# Patient Record
Sex: Female | Born: 1980 | Race: White | Hispanic: No | Smoking: Current every day smoker
Health system: Southern US, Community
[De-identification: ages and names within clinical notes are randomized; demographics above are authoritative.]

## PROBLEM LIST (undated history)

## (undated) VITALS — BP 109/81 | HR 106 | Temp 97.4°F | Resp 16 | Ht 60.63 in | Wt 100.4 lb

## (undated) DIAGNOSIS — B009 Herpesviral infection, unspecified: Secondary | ICD-10-CM

## (undated) DIAGNOSIS — F329 Major depressive disorder, single episode, unspecified: Secondary | ICD-10-CM

## (undated) DIAGNOSIS — R569 Unspecified convulsions: Secondary | ICD-10-CM

## (undated) DIAGNOSIS — F32A Depression, unspecified: Secondary | ICD-10-CM

## (undated) DIAGNOSIS — F319 Bipolar disorder, unspecified: Secondary | ICD-10-CM

---
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
## 2002-01-11 ENCOUNTER — Ambulatory Visit (HOSPITAL_COMMUNITY): Admission: RE | Admit: 2002-01-11 | Discharge: 2002-01-11 | Payer: Self-pay | Admitting: Obstetrics & Gynecology

## 2002-01-11 ENCOUNTER — Encounter: Payer: Self-pay | Admitting: Obstetrics & Gynecology

## 2002-02-08 ENCOUNTER — Ambulatory Visit (HOSPITAL_COMMUNITY): Admission: RE | Admit: 2002-02-08 | Discharge: 2002-02-08 | Payer: Self-pay | Admitting: Obstetrics & Gynecology

## 2002-02-08 ENCOUNTER — Encounter: Payer: Self-pay | Admitting: Obstetrics & Gynecology

## 2002-03-03 ENCOUNTER — Inpatient Hospital Stay (HOSPITAL_COMMUNITY): Admission: AD | Admit: 2002-03-03 | Discharge: 2002-03-08 | Payer: Self-pay | Admitting: Obstetrics

## 2002-03-03 ENCOUNTER — Encounter (INDEPENDENT_AMBULATORY_CARE_PROVIDER_SITE_OTHER): Payer: Self-pay | Admitting: Specialist

## 2003-01-26 ENCOUNTER — Emergency Department (HOSPITAL_COMMUNITY): Admission: EM | Admit: 2003-01-26 | Discharge: 2003-01-26 | Payer: Self-pay | Admitting: Emergency Medicine

## 2003-08-22 ENCOUNTER — Encounter (INDEPENDENT_AMBULATORY_CARE_PROVIDER_SITE_OTHER): Payer: Self-pay | Admitting: Specialist

## 2003-08-22 ENCOUNTER — Inpatient Hospital Stay (HOSPITAL_COMMUNITY): Admission: AD | Admit: 2003-08-22 | Discharge: 2003-08-24 | Payer: Self-pay | Admitting: Obstetrics

## 2005-05-01 ENCOUNTER — Emergency Department (HOSPITAL_COMMUNITY): Admission: EM | Admit: 2005-05-01 | Discharge: 2005-05-01 | Payer: Self-pay | Admitting: Emergency Medicine

## 2005-10-29 ENCOUNTER — Emergency Department (HOSPITAL_COMMUNITY): Admission: EM | Admit: 2005-10-29 | Discharge: 2005-10-30 | Payer: Self-pay | Admitting: Emergency Medicine

## 2005-11-22 ENCOUNTER — Emergency Department (HOSPITAL_COMMUNITY): Admission: EM | Admit: 2005-11-22 | Discharge: 2005-11-22 | Payer: Self-pay | Admitting: *Deleted

## 2005-12-10 ENCOUNTER — Emergency Department (HOSPITAL_COMMUNITY): Admission: EM | Admit: 2005-12-10 | Discharge: 2005-12-10 | Payer: Self-pay | Admitting: Family Medicine

## 2005-12-24 ENCOUNTER — Emergency Department (HOSPITAL_COMMUNITY): Admission: EM | Admit: 2005-12-24 | Discharge: 2005-12-24 | Payer: Self-pay | Admitting: Family Medicine

## 2007-08-10 ENCOUNTER — Ambulatory Visit (HOSPITAL_COMMUNITY): Admission: RE | Admit: 2007-08-10 | Discharge: 2007-08-10 | Payer: Self-pay | Admitting: Obstetrics and Gynecology

## 2007-08-31 ENCOUNTER — Ambulatory Visit (HOSPITAL_COMMUNITY): Admission: RE | Admit: 2007-08-31 | Discharge: 2007-08-31 | Payer: Self-pay | Admitting: Obstetrics and Gynecology

## 2007-09-17 ENCOUNTER — Ambulatory Visit (HOSPITAL_COMMUNITY): Admission: RE | Admit: 2007-09-17 | Discharge: 2007-09-17 | Payer: Self-pay | Admitting: Obstetrics and Gynecology

## 2007-10-15 ENCOUNTER — Ambulatory Visit (HOSPITAL_COMMUNITY): Admission: RE | Admit: 2007-10-15 | Discharge: 2007-10-15 | Payer: Self-pay | Admitting: Obstetrics and Gynecology

## 2007-11-04 ENCOUNTER — Inpatient Hospital Stay (HOSPITAL_COMMUNITY): Admission: AD | Admit: 2007-11-04 | Discharge: 2007-11-04 | Payer: Self-pay | Admitting: Obstetrics and Gynecology

## 2007-11-05 ENCOUNTER — Ambulatory Visit (HOSPITAL_COMMUNITY): Admission: RE | Admit: 2007-11-05 | Discharge: 2007-11-05 | Payer: Self-pay | Admitting: Obstetrics and Gynecology

## 2007-11-19 ENCOUNTER — Ambulatory Visit (HOSPITAL_COMMUNITY): Admission: RE | Admit: 2007-11-19 | Discharge: 2007-11-19 | Payer: Self-pay | Admitting: Obstetrics and Gynecology

## 2007-12-15 ENCOUNTER — Inpatient Hospital Stay (HOSPITAL_COMMUNITY): Admission: AD | Admit: 2007-12-15 | Discharge: 2007-12-15 | Payer: Self-pay | Admitting: Obstetrics & Gynecology

## 2009-03-07 ENCOUNTER — Inpatient Hospital Stay (HOSPITAL_COMMUNITY): Admission: EM | Admit: 2009-03-07 | Discharge: 2009-03-10 | Payer: Self-pay | Admitting: Emergency Medicine

## 2009-03-10 ENCOUNTER — Encounter (INDEPENDENT_AMBULATORY_CARE_PROVIDER_SITE_OTHER): Payer: Self-pay | Admitting: Nurse Practitioner

## 2009-04-26 ENCOUNTER — Encounter (INDEPENDENT_AMBULATORY_CARE_PROVIDER_SITE_OTHER): Payer: Self-pay | Admitting: Nurse Practitioner

## 2009-07-06 ENCOUNTER — Emergency Department (HOSPITAL_COMMUNITY): Admission: EM | Admit: 2009-07-06 | Discharge: 2009-07-07 | Payer: Self-pay | Admitting: Emergency Medicine

## 2009-08-14 ENCOUNTER — Emergency Department (HOSPITAL_COMMUNITY): Admission: EM | Admit: 2009-08-14 | Discharge: 2009-08-15 | Payer: Self-pay | Admitting: Emergency Medicine

## 2010-03-05 ENCOUNTER — Encounter: Payer: Self-pay | Admitting: Obstetrics and Gynecology

## 2010-03-13 NOTE — Miscellaneous (Signed)
Summary: Pt no-showed to hospital f/u on 03/24/2009  Clinical Lists Changes

## 2010-03-13 NOTE — Letter (Signed)
Summary: Discharge Summary  Discharge Summary   Imported By: Arta Bruce 06/23/2009 16:00:03  _____________________________________________________________________  External Attachment:    Type:   Image     Comment:   External Document

## 2010-04-29 LAB — URINALYSIS, ROUTINE W REFLEX MICROSCOPIC
Glucose, UA: NEGATIVE mg/dL
Protein, ur: 100 mg/dL — AB
Urobilinogen, UA: 1 mg/dL (ref 0.0–1.0)

## 2010-04-29 LAB — RAPID URINE DRUG SCREEN, HOSP PERFORMED
Opiates: POSITIVE — AB
Tetrahydrocannabinol: NOT DETECTED

## 2010-04-29 LAB — CBC
MCH: 29.6 pg (ref 26.0–34.0)
MCHC: 35.1 g/dL (ref 30.0–36.0)
Platelets: 204 10*3/uL (ref 150–400)

## 2010-04-29 LAB — BASIC METABOLIC PANEL
CO2: 26 mEq/L (ref 19–32)
Calcium: 9.7 mg/dL (ref 8.4–10.5)
Sodium: 138 mEq/L (ref 135–145)

## 2010-04-29 LAB — DIFFERENTIAL
Basophils Relative: 0 % (ref 0–1)
Eosinophils Absolute: 0 10*3/uL (ref 0.0–0.7)
Lymphs Abs: 0.9 10*3/uL (ref 0.7–4.0)
Neutro Abs: 7.5 10*3/uL (ref 1.7–7.7)
Neutrophils Relative %: 87 % — ABNORMAL HIGH (ref 43–77)

## 2010-04-29 LAB — URINE MICROSCOPIC-ADD ON

## 2010-04-29 LAB — PREGNANCY, URINE: Preg Test, Ur: NEGATIVE

## 2010-04-30 LAB — BASIC METABOLIC PANEL
BUN: 9 mg/dL (ref 6–23)
BUN: 7 mg/dL (ref 6–23)
CO2: 26 mEq/L (ref 19–32)
Chloride: 106 mEq/L (ref 96–112)
GFR calc Af Amer: >60 mL/min (ref >60)
GFR calc Af Amer: >60 mL/min (ref >60)
GFR calc non Af Amer: >60 mL/min (ref >60)
GFR calc non Af Amer: >60 mL/min (ref >60)
Glucose, Bld: 93 mg/dL (ref 70–99)
Glucose, Bld: 79 mg/dL (ref 70–99)
Potassium: 3.5 mEq/L (ref 3.5–5.1)
Potassium: 3.5 mEq/L (ref 3.5–5.1)
Potassium: 3.7 mEq/L (ref 3.5–5.1)
Sodium: 136 mEq/L (ref 135–145)
Sodium: 139 mEq/L (ref 135–145)

## 2010-04-30 LAB — TSH: TSH: 2.078 u[IU]/mL (ref 0.350–4.500)

## 2010-04-30 LAB — TYPE AND SCREEN
ABO/RH(D): O POS
Antibody Screen: NEGATIVE

## 2010-04-30 LAB — DIFFERENTIAL
Basophils Absolute: 0 10*3/uL (ref 0.0–0.1)
Basophils Absolute: 0 10*3/uL (ref 0.0–0.1)
Basophils Relative: 1 % (ref 0–1)
Basophils Relative: 1 % (ref 0–1)
Basophils Relative: 1 % (ref 0–1)
Eosinophils Absolute: 0.1 10*3/uL (ref 0.0–0.7)
Eosinophils Relative: 0 % (ref 0–5)
Eosinophils Relative: 2 % (ref 0–5)
Lymphocytes Relative: 51 % — ABNORMAL HIGH (ref 12–46)
Lymphocytes Relative: 38 % (ref 12–46)
Lymphs Abs: 1.2 10*3/uL (ref 0.7–4.0)
Monocytes Absolute: 0.2 10*3/uL (ref 0.1–1.0)
Monocytes Absolute: 0.3 10*3/uL (ref 0.1–1.0)
Monocytes Relative: 5 % (ref 3–12)
Monocytes Relative: 7 % (ref 3–12)
Neutro Abs: 1 10*3/uL — ABNORMAL LOW (ref 1.7–7.7)
Neutro Abs: 1.1 10*3/uL — ABNORMAL LOW (ref 1.7–7.7)

## 2010-04-30 LAB — RETICULOCYTES: RBC.: 4.41 MIL/uL (ref 3.87–5.11)

## 2010-04-30 LAB — CBC
HCT: 25.5 % — ABNORMAL LOW (ref 36.0–46.0)
HCT: 35.5 % — ABNORMAL LOW (ref 36.0–46.0)
Hemoglobin: 7.5 g/dL — ABNORMAL LOW (ref 12.0–15.0)
Hemoglobin: 7.8 g/dL — ABNORMAL LOW (ref 12.0–15.0)
MCHC: 33.4 g/dL (ref 30.0–36.0)
MCV: 62.1 fL — ABNORMAL LOW (ref 78.0–100.0)
MCV: 84.7 fL (ref 78.0–100.0)
Platelets: 162 10*3/uL (ref 150–400)
Platelets: 177 10*3/uL (ref 150–400)
RBC: 3.97 MIL/uL (ref 3.87–5.11)
RDW: 13.2 % (ref 11.5–15.5)
WBC: 2.4 10*3/uL — ABNORMAL LOW (ref 4.0–10.5)
WBC: 2.5 10*3/uL — ABNORMAL LOW (ref 4.0–10.5)
WBC: 2.8 10*3/uL — ABNORMAL LOW (ref 4.0–10.5)

## 2010-04-30 LAB — PREGNANCY, URINE: Preg Test, Ur: NEGATIVE

## 2010-04-30 LAB — VITAMIN B12: Vitamin B-12: 269 pg/mL (ref 211–911)

## 2010-04-30 LAB — POCT PREGNANCY, URINE: Preg Test, Ur: NEGATIVE

## 2010-04-30 LAB — RAPID URINE DRUG SCREEN, HOSP PERFORMED
Amphetamines: NOT DETECTED
Benzodiazepines: NOT DETECTED
Cocaine: NOT DETECTED
Opiates: POSITIVE — AB
Tetrahydrocannabinol: NOT DETECTED
Tetrahydrocannabinol: NOT DETECTED

## 2010-04-30 LAB — URINE MICROSCOPIC-ADD ON

## 2010-04-30 LAB — URINALYSIS, ROUTINE W REFLEX MICROSCOPIC
Hgb urine dipstick: NEGATIVE
Protein, ur: 30 mg/dL — AB
Urobilinogen, UA: 0.2 mg/dL (ref 0.0–1.0)

## 2010-04-30 LAB — HEPATIC FUNCTION PANEL
Alkaline Phosphatase: 49 U/L (ref 39–117)
Total Bilirubin: 0.2 mg/dL — ABNORMAL LOW (ref 0.3–1.2)
Total Protein: 6 g/dL (ref 6.0–8.3)

## 2010-04-30 LAB — ETHANOL: Alcohol, Ethyl (B): <5 mg/dL (ref 0–10)

## 2010-04-30 LAB — T4, FREE: Free T4: 0.76 ng/dL — ABNORMAL LOW (ref 0.80–1.80)

## 2010-04-30 LAB — TRICYCLICS SCREEN, URINE: TCA Scrn: NOT DETECTED

## 2010-04-30 LAB — SALICYLATE LEVEL: Salicylate Lvl: <4 mg/dL (ref 2.8–20.0)

## 2010-04-30 LAB — MAGNESIUM: Magnesium: 1.8 mg/dL (ref 1.5–2.5)

## 2010-04-30 LAB — HEMOCCULT GUIAC POC 1CARD (OFFICE): Fecal Occult Bld: NEGATIVE

## 2010-04-30 LAB — AMMONIA: Ammonia: 29 umol/L (ref 11–35)

## 2010-04-30 LAB — IRON AND TIBC: UIBC: 316 ug/dL

## 2010-06-29 NOTE — Op Note (Signed)
NAME:  Julie Sanford, Julie Sanford                         ACCOUNT NO.:  [id number]   MEDICAL RECORD NO.:  [id number]                   PATIENT TYPE:  INP   LOCATION:  9121                                 FACILITY:  [facility]   PHYSICIAN:  Charles A. Clearance Coots, M.D.             DATE OF BIRTH:  21-Dec-1980   DATE OF PROCEDURE:  03/03/2002  DATE OF DISCHARGE:                                 OPERATIVE REPORT   PREOPERATIVE DIAGNOSES:  1. A 30-week gestation.  2. Spontaneous rupture of membranes.  3. Active labor.  4. Fetal gastroschisis.  5. Questionable human immunodeficiency virus status.   POSTOPERATIVE DIAGNOSES:  1. A 30-week gestation.  2. Spontaneous rupture of membranes.  3. Active labor.  4. Fetal gastroschisis.  5. Questionable human immunodeficiency virus status.   PROCEDURE:  Primary low transverse cesarean section.   SURGEON:  Charles A. Clearance Coots, M.D.   ANESTHESIA:  Spinal.   ESTIMATED BLOOD LOSS:  800 ml.   COMPLICATIONS:  None.   DRAINS:  Foley to gravity.   FINDINGS:  Viable female at 15.  Apgars of 0 at one minute, 5 at five  minutes, 7 at 10 minutes.  Weight was 1401 g.  Normal uterus, ovaries, and  fallopian tubes.   OPERATION:  The patient was brought to the operating room and after  satisfactory spinal anesthesia the abdomen was prepped and draped in the  usual sterile fashion.  An indwelling Foley catheter was placed at the time  of sterile prep.  Pfannenstiel skin incision was made with a scalpel that  was deep and down to the fascia with the scalpel.  The fascia was nicked in  midline and the fascial incision was extended to the left and to the right  with curved Mayo scissors.  The superior/inferior fascial edges were taken  off the rectus muscle with both blunt and sharp dissection.  Rectus muscle  was bluntly divided in the midline and peritoneum was entered bluntly and  was digitally extended to the left and to the right.  Bladder blade was  positioned and the  vesicouterine fold with peritoneum above the reflection  of the urinary bladder was grasped with the forceps and was incised and  undermined with Metzenbaum scissors.  The incision was extended to the left  and to the right with the Metzenbaum scissors.  Bladder flap was bluntly  developed and the bladder blade was repositioned in front of the urinary  bladder placing it well out of the operative field.  Uterus was entered in  the lower uterine segment with sharp strokes of the scalpel.  Clear amniotic  fluid was expelled.  The uterine incision was extended to the left and to  the right digitally.  The vertex was then delivered ______ and the delivery  was then completed _____ with the aid of fundal pressure from the assistant.  The amniotic sac ruptured as the fetus was elevated  out through the  incision.  The umbilical cord was doubly clamped and cut and the infant was  handed off to the nursing staff.  Cord pH and cord blood were obtained and  the placenta was spontaneously expelled from the uterine cavity intact.  The  uterus was exteriorized and the endometrial surface was thoroughly debrided  with a dry lap sponge.  The edges of the uterine incision were grasped with  ring forceps and the uterus was closed with a continuous interlocking suture  of 0 Monocryl from each corner to the center.  Hemostasis was excellent.  Uterus was placed back in the normal anatomic position and pelvic cavity was  thoroughly irrigated with warm saline solution.  The closure of the uterus  was again observed for hemostasis and there was no active bleeding noted.  The abdomen was then closed as follows:  The fascia was closed with a  continuous suture of 0 Vicryl.  The subcutaneous tissue was thoroughly  irrigated with warm saline solution and all areas of subcutaneous bleeding  were coagulated with the Bovie.  Skin was then approximated with a  continuous subcutaneous suture of 3-0 Monocryl.  Sterile  bandage was applied  to the incision closure.  Surgical technician indicated all needle, sponge,  and instrument counts were correct.  The patient tolerated procedure well.  Transported to recovery room in satisfactory condition.                                               Charles A. Clearance Coots, M.D.    CAH/MEDQ  D:  03/03/2002  T:  03/03/2002  Job:  [id number]

## 2010-06-29 NOTE — Consult Note (Signed)
NAME:  Julie Sanford, Julie Sanford               ACCOUNT NO.:  [id number]   MEDICAL RECORD NO.:  [id number]          PATIENT TYPE:  EMS   LOCATION:  MAJO                         FACILITY:  [facility]   PHYSICIAN:  Vanita Panda. Magnus Ivan, M.D.DATE OF BIRTH:  06-25-1980   DATE OF CONSULTATION:  05/01/2005  DATE OF DISCHARGE:                                   CONSULTATION   REASON FOR CONSULTATION:  Left ring finger abscess/infection.   CONSULTING PHYSICIAN:  Trudi Ida. Denton Lank, M.D., ER physician.   HISTORY OF PRESENT ILLNESS:  Briefly, Ms. Keegan is a 30 year old right-hand  dominant female who noticed swelling and tenderness in her fingers several  days ago.  She said at least two days ago in [place], [place], an ER  physician used a needle to open a small area on the volar aspect of her  finger toward the DIP joint.  She said pus did come out of this area and she  was started on Bactrim double strength.  She was not given any IV  antibiotics.  She then returned to the [facility] ER  today two days later with worsening infection in her finger.  The orthopedic  hand surgery service was consulted.   PAST MEDICAL HISTORY:  No active medical problems according to the patient.   MEDICATIONS:  1.  Bactrim Double Strength.  2.  Hydrocodone as needed.   ALLERGIES:  PENICILLIN.   SOCIAL HISTORY:  She reports that she does smoke about a pack a day.  She  lives at home with her five kids with the most recent birth a month ago.  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   REVIEW OF SYSTEMS:  Negative for chest pain, shortness of breath, fever or  chills, nausea or vomiting.   CLINICAL EXAMINATION:  GENERAL:  She is alert and oriented female in no  acute distress, but obvious discomfort.  EXTREMITIES:  Examination of the upper extremities shows a small red streak  in the arm suggesting a deep uncontrolled infection.  The ring finger does  have erythema from the middle  phalanx to the tip.  There is a small wound on  this finger with gross purulence, suggesting abscess.  She has no evidence  of flexor tenosynovitis and this appears to be a deep infection and abscess,  however, does not appear to involve the tendon sheath.  She can flex and  extend the finger and she has no pain proximally at the MCP joint or into  her hand.   X-rays show no evidence of osteomyelitis.   IMPRESSION:  This is a 30 year old female with a complicated left index  finger felon.   PLAN:  While in the emergency room, I prepped her hand with Betadine and  alcohol.  I then provided a 2% plain lidocaine digital block.  I was able to  make an incision along the volar aspect of her finger at the area of this  wound, gross purulence was encountered.  I opened the incision approximately  2 cm.  I was able to then irrigate  the wound thoroughly with a mixture of  Betadine and normal saline and put at least a liter of fluid through this  area of her finger.  I then placed two stitches in the wound but left the  distal part open.  Xeroform was packed in this wound and left open, followed  by sterile dressings and Coban.  She was given an IV dose of vancomycin.  I  will discharge her from the emergency room with specific instructions for  wound care consisting of twice daily soaks in Dial antibacterial soapy water  starting tomorrow.  I will also switch her from Bactrim Double Strength to  Doxycycline 100 mg twice daily.  Follow-up should be established in my  office in the next two to three days.           ______________________________  Vanita Panda. Magnus Ivan, M.D.     CYB/MEDQ  D:  05/01/2005  T:  05/03/2005  Job:  [id number]

## 2010-06-29 NOTE — H&P (Signed)
[facility]  PatientANIYIAH, ZELL Visit Number: [id number] MRN: [id number]          Service Type: Attending:  Katy Fitch, M.D. Dictated by:   Katy Fitch, M.D.                           History and Physical  CHIEF COMPLAINT:              Labor.  HISTORY OF PRESENT ILLNESS:   Patient is a 30 year old G2, P1 at 39 weeks and 5 days who presents to the office complaining of contractions every four minutes for the past three hours.  Patient was checked in the office and noted to have membranes intact and noted to be 7 cm dilated.  Patient was sent to the hospital for direct admission.  Patient is GBS negative and has had no prenatal complications.  PAST MEDICAL HISTORY:         Positive for HSV.  PAST SURGICAL HISTORY:        None.  MEDICATIONS:                  Prenatal vitamins.  ALLERGIES:                    None.  SOCIAL HISTORY:               Positive for tobacco.  No alcohol or drugs.  FAMILY HISTORY:               Without any mental retardation or epithelial cancers.  PRENATAL LABORATORIES:        O+.  Rubella immune.  GBS negative.  PHYSICAL EXAMINATION  VITAL SIGNS:                  Blood pressure 122/70.  HEENT:                        Throat:  Clear.  LUNGS:                        Clear to auscultation bilaterally.  HEART:                        Regular rate and rhythm.  ABDOMEN:                      Gravid, nontender.  Estimated fetal weight 6 pounds 12 ounces.  PELVIC:                       Cervix:  7, 90, 0 station.  PLAN:                         Admit to hospital directly.  Will give patient pain relief with epidural and anticipate a spontaneous vaginal delivery. Dictated by:   Katy Fitch, M.D. Attending:  Katy Fitch, M.D. DD:  02/06/01 TD:  02/06/01 Job: [id number]

## 2010-06-29 NOTE — Discharge Summary (Signed)
NAME:  Julie Sanford, Julie Sanford                         ACCOUNT NO.:  [id number]   MEDICAL RECORD NO.:  [id number]                   PATIENT TYPE:  INP   LOCATION:  9121                                 [facility]ACILITY:  [facility]   PHYSICIAN:  Roseanna Rainbow, M.D.         DATE OF BIRTH:  [birth year]   DATE OF ADMISSION:  03/03/2002  DATE OF DISCHARGE:  03/08/2002                                 DISCHARGE SUMMARY   CHIEF COMPLAINT:  The patient is a 30 year old gravida 3, para 2 estimated  date of confinement of March 23 who presented with spontaneous rupture of  membranes.   HISTORY OF PRESENT ILLNESS:  As above.  The patient also reported  contractions.  Antepartum course to date complicated by a positive HIV  antibody titer with a negative Western blot and undetectable viral load and  infectious disease was consulted.  These tests were repeated and it was felt  most likely the patient was not HIV positive.  She also was known to have a  fetus with gastroschisis.   ALLERGIES:  PENICILLIN which she has a secondary rash to.   MEDICATIONS:  Prenatal vitamins.   PAST SURGICAL HISTORY:  She denies.   PAST MEDICAL HISTORY:  She denies.   FAMILY HISTORY:  She denies.   PHYSICAL EXAMINATION:  VITAL SIGNS:  Stable, afebrile.  GENERAL:  Thin Caucasian female in no apparent distress.  HEENT:  Normocephalic, atraumatic.  NECK:  Supple.  LUNGS:  Clear to auscultation bilaterally.  HEART:  Regular rate and rhythm.  ABDOMEN:  Soft, gravid, nontender.  PELVIC:  The external female genitalia are normal appearing.  The cervix is  8 cm dilated, 100% effaced.  EXTREMITIES:  No clubbing, cyanosis, edema.  SKIN:  Without rash.   IMPRESSION:  Intrauterine pregnancy at 30+ weeks with a known gastroschisis,  active labor.   PLAN:  Admission and urgent cesarean delivery.   HOSPITAL COURSE:  The patient underwent a cesarean delivery.  Please see the  dictated operative summary for further details.  She was  felt to have a mild  endometritis that responded to intravenous antibiotics and clinically this  resolved and she was discharged to home on postoperative day number five  tolerating a regular diet.  She received Depo Provera prior to discharge for  contraception.  The patient also was noted to be anemic preoperatively and  her hemoglobin on postoperative day number one was 8.6.   DISCHARGE DIAGNOSES:  1. Intrauterine pregnancy at 30+ weeks with preterm labor.  2. Fetal gastroschisis.  3. Postpartum endometritis.   PROCEDURE:  Primary cesarean delivery.   CONDITION ON DISCHARGE:  Stable.   DIET:  Regular.   ACTIVITY:  As per the instruction booklet.   DISCHARGE MEDICATIONS:  1. Percocet.  2. Ibuprofen.  3. Prenatal vitamins.  4. Ferrous sulfate.   DISPOSITION:  The patient was to follow up in one week for an incision  check.  Roseanna Rainbow, M.D.    Judee Clara  D:  04/15/2002  T:  04/15/2002  Job:  [id number]

## 2010-06-29 NOTE — Discharge Summary (Signed)
[facility]  Patient:    Julie Sanford, Julie Sanford Visit Number: [id number] MRN: [id number]          Service Type: OBS Location: 910A 9135 01 Attending Physician:  Wetzel Bjornstad Dictated by:   Antony Contras, [facility] Admit Date:  02/06/2001 Discharge Date: 02/07/2001                             Discharge Summary  DISCHARGE DIAGNOSES:          1. Term intrauterine pregnancy.                               2. Spontaneous onset of labor.  PROCEDURES:                   Normal spontaneous vaginal delivery of viable infant over intact perineum with repair of left labial laceration.  HISTORY OF PRESENT ILLNESS:   The patient is a 30 year old gravida 2, para 1-0-0-1, LMP April 14, 2000, [facility] February 06, 2001.  Risk factors include history of tobacco use, HSV2 teen pregnancy, first trimester VD.  LABS:                         Blood type 0 positive, antibody screen negative. RPR, HBSAG, HIV nonreactive.  Proxo negative.  HOSPITAL COURSE/TREATMENT:    The patient was admitted on February 06, 2001 with advanced cervical dilatation, 7 cm, 90% -0 station.  She progressed to complete dilatation. Delivered an Apgar 36,9 female, weighing 5 pounds 12 ounces over intact perineum.  Repair of left labial laceration.  Postpartum course the patient requested discharge on her first postpartum day. She had no difficulty voiding, was afebrile.  Was able to be discharged in satisfactory condition.  DISCHARGE LABS:               CBC -- Hemoglobin 9.1.  DISPOSITION:                  Follow-up six weeks.  Continue with prenatal vitamins and iron.  Motrin for pain. Dictated by:   Antony Contras, [facility] Attending Physician:  Wetzel Bjornstad DD:  02/25/01 TD:  02/25/01 Job: [id number]

## 2010-11-13 LAB — CBC
HCT: 22.8 — ABNORMAL LOW
Hemoglobin: 7.3 — CL
MCHC: 32.2
MCV: 69.3 — ABNORMAL LOW
Platelets: 321
RBC: 3.29 — ABNORMAL LOW
RDW: 18.8 — ABNORMAL HIGH
WBC: 5.4

## 2010-11-13 LAB — COMPREHENSIVE METABOLIC PANEL
ALT: 12
Albumin: 2.5 — ABNORMAL LOW
Alkaline Phosphatase: 103
BUN: 5 — ABNORMAL LOW
Chloride: 109
Glucose, Bld: 92
Potassium: 3.6
Sodium: 139
Total Bilirubin: 0.2 — ABNORMAL LOW
Total Protein: 6.1

## 2010-11-13 LAB — URINALYSIS, ROUTINE W REFLEX MICROSCOPIC
Bilirubin Urine: NEGATIVE
Hgb urine dipstick: NEGATIVE
Ketones, ur: NEGATIVE
Nitrite: NEGATIVE
Protein, ur: NEGATIVE
Urobilinogen, UA: 0.2

## 2010-11-13 LAB — RAPID URINE DRUG SCREEN, HOSP PERFORMED
Amphetamines: NOT DETECTED
Tetrahydrocannabinol: NOT DETECTED

## 2011-04-22 ENCOUNTER — Encounter (HOSPITAL_COMMUNITY): Payer: Self-pay | Admitting: *Deleted

## 2011-04-22 ENCOUNTER — Emergency Department (HOSPITAL_COMMUNITY)
Admission: EM | Admit: 2011-04-22 | Discharge: 2011-04-23 | Disposition: A | Discharge status: Other Acute Inpatient Hospital | Payer: Self-pay | Attending: Emergency Medicine | Admitting: Emergency Medicine

## 2011-04-22 DIAGNOSIS — IMO0001 Reserved for inherently not codable concepts without codable children: Secondary | ICD-10-CM | POA: Insufficient documentation

## 2011-04-22 DIAGNOSIS — F1110 Opioid abuse, uncomplicated: Secondary | ICD-10-CM | POA: Insufficient documentation

## 2011-04-22 DIAGNOSIS — M2550 Pain in unspecified joint: Secondary | ICD-10-CM | POA: Insufficient documentation

## 2011-04-22 HISTORY — DX: Depression, unspecified: F32.A

## 2011-04-22 HISTORY — DX: Major depressive disorder, single episode, unspecified: F32.9

## 2011-04-22 LAB — COMPREHENSIVE METABOLIC PANEL
ALT: 8 U/L (ref 0–35)
AST: 16 U/L (ref 0–37)
Albumin: 4 g/dL (ref 3.5–5.2)
Calcium: 8.8 mg/dL (ref 8.4–10.5)
Creatinine, Ser: 1.18 mg/dL — ABNORMAL HIGH (ref 0.50–1.10)
Sodium: 138 mEq/L (ref 135–145)

## 2011-04-22 LAB — CBC
MCH: 20.5 pg — ABNORMAL LOW (ref 26.0–34.0)
MCV: 68 fL — ABNORMAL LOW (ref 78.0–100.0)
Platelets: 197 10*3/uL (ref 150–400)
RBC: 4.59 MIL/uL (ref 3.87–5.11)
RDW: 18 % — ABNORMAL HIGH (ref 11.5–15.5)
WBC: 4 10*3/uL (ref 4.0–10.5)

## 2011-04-22 LAB — POCT PREGNANCY, URINE: Preg Test, Ur: NEGATIVE

## 2011-04-22 LAB — URINALYSIS, ROUTINE W REFLEX MICROSCOPIC
Bilirubin Urine: NEGATIVE
Glucose, UA: NEGATIVE mg/dL
Hgb urine dipstick: NEGATIVE
Ketones, ur: 40 mg/dL — AB
Protein, ur: 30 mg/dL — AB

## 2011-04-22 LAB — URINE MICROSCOPIC-ADD ON

## 2011-04-22 LAB — RAPID URINE DRUG SCREEN, HOSP PERFORMED
Amphetamines: POSITIVE — AB
Benzodiazepines: NOT DETECTED
Cocaine: POSITIVE — AB
Opiates: POSITIVE — AB
Tetrahydrocannabinol: NOT DETECTED

## 2011-04-22 LAB — ACETAMINOPHEN LEVEL: Acetaminophen (Tylenol), Serum: <15 ug/mL (ref 10–30)

## 2011-04-22 MED ORDER — ZOLPIDEM TARTRATE 5 MG PO TABS
10.0000 mg | ORAL_TABLET | Freq: Every evening | ORAL | Status: DC | PRN
  Administered 2011-04-22: 10 mg via ORAL
  Filled 2011-04-22: qty 2

## 2011-04-22 MED ORDER — NICOTINE 21 MG/24HR TD PT24
21.0000 mg | MEDICATED_PATCH | Freq: Every day | TRANSDERMAL | Status: DC
  Administered 2011-04-22: 21 mg via TRANSDERMAL
  Filled 2011-04-22 (×2): qty 1

## 2011-04-22 MED ORDER — ONDANSETRON HCL 4 MG PO TABS: 4.0000 mg | ORAL_TABLET | Freq: Three times a day (TID) | ORAL | Status: DC | PRN

## 2011-04-22 MED ORDER — LORAZEPAM 1 MG PO TABS
1.0000 mg | ORAL_TABLET | Freq: Three times a day (TID) | ORAL | Status: DC | PRN
  Administered 2011-04-22 – 2011-04-23 (×2): 1 mg via ORAL
  Filled 2011-04-22 (×2): qty 1

## 2011-04-22 MED ORDER — IBUPROFEN 800 MG PO TABS
800.0000 mg | ORAL_TABLET | Freq: Three times a day (TID) | ORAL | Status: DC | PRN
  Administered 2011-04-22: 800 mg via ORAL
  Filled 2011-04-22: qty 1

## 2011-04-22 NOTE — ED Provider Notes (Signed)
History     CSN: [id number]  Arrival date & time 04/22/11  1744   First MD Initiated Contact with Patient 04/22/11 2016      Chief Complaint  Patient presents with  . 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 pt denies SI/HI.     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  No fever or chills, denies suicidal or homicidal ideation at this time.  Patient is a 31 y.o. female presenting with drug problem. The history is provided by the patient. No language interpreter was used.  Drug Problem This is a recurrent problem. The current episode started yesterday. The problem occurs constantly. The problem has been unchanged. Associated symptoms include arthralgias and myalgias. Pertinent negatives include no abdominal pain, anorexia, change in bowel habit, chest pain, chills, congestion, coughing, diaphoresis, fatigue, fever, headaches, joint swelling, nausea, neck pain, numbness, rash, sore throat, swollen glands, urinary symptoms, vertigo, visual change, vomiting or weakness. The symptoms are aggravated by nothing. She has tried nothing for the symptoms. The treatment provided no relief.    History reviewed. No pertinent past medical history.  Past Surgical History  Procedure Date  . Cesarean section     History reviewed. No pertinent family history.  History  Substance Use Topics  . Smoking status: Current Everyday Smoker    Types: Cigarettes  . Smokeless tobacco: Not on file  . Alcohol Use: No    OB History    Grav Para Term Preterm Abortions TAB SAB Ect Mult Living                  Review of Systems  Constitutional: Negative for fever, chills, diaphoresis and fatigue.  HENT:  Negative for congestion, sore throat and neck pain.   Respiratory: Negative for cough.   Cardiovascular: Negative for chest pain.  Gastrointestinal: Negative for nausea, vomiting, abdominal pain, anorexia and change in bowel habit.  Musculoskeletal: Positive for myalgias and arthralgias. Negative for joint swelling.  Skin: Negative for rash.  Neurological: Negative for vertigo, weakness, numbness and headaches.  All other systems reviewed and are negative.    Allergies  Tramadol  Home Medications   Current Outpatient Rx  Name Route Sig Dispense Refill  . IBUPROFEN 200 MG PO TABS Oral Take 800 mg by mouth every 8 (eight) hours as needed. For pain    . METHADONE HCL 10 MG/ML PO CONC Oral Take 60 mg by mouth daily.      BP 111/73  Pulse 99  Temp(Src) 98.6 F (37 C) (Oral)  Resp 20  Wt 108 lb (48.988 kg)  SpO2 98%  LMP 04/15/2011  Physical Exam  Nursing note and vitals reviewed. Constitutional: She is oriented to person, place, and time. She appears well-developed and well-nourished. No distress.  HENT:  Head: Normocephalic and atraumatic.  Right Ear: External ear normal.  Left Ear: External ear normal.  Nose: Nose normal.  Mouth/Throat: Oropharynx is clear and moist. No oropharyngeal exudate.  Eyes: Conjunctivae are normal. Pupils are equal, round, and reactive to light. No scleral icterus.  Neck: Normal range of motion. Neck supple.  Cardiovascular: Normal rate, regular rhythm and normal heart sounds.  Exam reveals no gallop and no friction rub.   No murmur heard. Pulmonary/Chest: Effort normal  and breath sounds normal. No respiratory distress. She has no wheezes. She has no rales. She exhibits no tenderness.  Abdominal: Soft. Bowel sounds are normal. She exhibits no distension. There is no tenderness.  Musculoskeletal: Normal range of motion. She exhibits tenderness. She exhibits no edema.       Diffuse joint tenderness  Lymphadenopathy:    She has no cervical  adenopathy.  Neurological: She is alert and oriented to person, place, and time. No cranial nerve deficit.  Skin: Skin is warm and dry. No rash noted. No erythema. No pallor.  Psychiatric: She has a normal mood and affect. Her behavior is normal. Judgment and thought content normal.    ED Course  Procedures (including critical care time)  Labs Reviewed  CBC - Abnormal; Notable for the following:    Hemoglobin 9.4 (*)    HCT 31.2 (*)    MCV 68.0 (*)    MCH 20.5 (*)    RDW 18.0 (*)    All other components within normal limits  URINE RAPID DRUG SCREEN (HOSP PERFORMED)  POCT PREGNANCY, URINE  COMPREHENSIVE METABOLIC PANEL  ETHANOL  ACETAMINOPHEN LEVEL  URINALYSIS, ROUTINE W REFLEX MICROSCOPIC   No results found.  Results for orders placed during the hospital encounter of 04/22/11  CBC      Component Value Range   WBC 4.0  4.0 - 10.5 (K/uL)   RBC 4.59  3.87 - 5.11 (MIL/uL)   Hemoglobin 9.4 (*) 12.0 - 15.0 (g/dL)   HCT 16.1 (*) 09.6 - 46.0 (%)   MCV 68.0 (*) 78.0 - 100.0 (fL)   MCH 20.5 (*) 26.0 - 34.0 (pg)   MCHC 30.1  30.0 - 36.0 (g/dL)   RDW 04.5 (*) 40.9 - 15.5 (%)   Platelets 197  150 - 400 (K/uL)  COMPREHENSIVE METABOLIC PANEL      Component Value Range   Sodium 138  135 - 145 (mEq/L)   Potassium 3.3 (*) 3.5 - 5.1 (mEq/L)   Chloride 101  96 - 112 (mEq/L)   CO2 25  19 - 32 (mEq/L)   Glucose, Bld 68 (*) 70 - 99 (mg/dL)   BUN 13  6 - 23 (mg/dL)   Creatinine, Ser 8.11 (*) 0.50 - 1.10 (mg/dL)   Calcium 8.8  8.4 - 91.4 (mg/dL)   Total Protein 7.9  6.0 - 8.3 (g/dL)   Albumin 4.0  3.5 - 5.2 (g/dL)   AST 16  0 - 37 (U/L)   ALT 8  0 - 35 (U/L)   Alkaline Phosphatase 82  39 - 117 (U/L)   Total Bilirubin 0.2 (*) 0.3 - 1.2 (mg/dL)   GFR calc non Af Amer 62 (*) >90 (mL/min)   GFR calc Af Amer 72 (*) >90 (mL/min)  ETHANOL      Component Value Range   Alcohol, Ethyl (B) <11  0 - 11 (mg/dL)  ACETAMINOPHEN LEVEL      Component Value Range   Acetaminophen (Tylenol), Serum  <15.0  10 - 30 (ug/mL)  URINE RAPID DRUG SCREEN (HOSP PERFORMED)      Component Value Range   Opiates POSITIVE (*) NONE DETECTED    Cocaine POSITIVE (*) NONE DETECTED    Benzodiazepines NONE DETECTED  NONE DETECTED    Amphetamines POSITIVE (*) NONE DETECTED    Tetrahydrocannabinol NONE DETECTED  NONE DETECTED    Barbiturates NONE DETECTED  NONE DETECTED   URINALYSIS, ROUTINE W REFLEX MICROSCOPIC      Component Value Range   Color,  Urine YELLOW  YELLOW    APPearance HAZY (*) CLEAR    Specific Gravity, Urine 1.020  1.005 - 1.030    pH 5.0  5.0 - 8.0    Glucose, UA NEGATIVE  NEGATIVE (mg/dL)   Hgb urine dipstick NEGATIVE  NEGATIVE    Bilirubin Urine NEGATIVE  NEGATIVE    Ketones, ur 40 (*) NEGATIVE (mg/dL)   Protein, ur 30 (*) NEGATIVE (mg/dL)   Urobilinogen, UA 0.2  0.0 - 1.0 (mg/dL)   Nitrite NEGATIVE  NEGATIVE    Leukocytes, UA NEGATIVE  NEGATIVE   POCT PREGNANCY, URINE      Component Value Range   Preg Test, Ur NEGATIVE  NEGATIVE   URINE MICROSCOPIC-ADD ON      Component Value Range   Squamous Epithelial / LPF FEW (*) RARE    WBC, UA 7-10  <3 (WBC/hpf)   Bacteria, UA FEW (*) RARE    Casts HYALINE CASTS (*) NEGATIVE    Urine-Other MUCOUS PRESENT     No results found.   Narcotic Abuse    MDM  Patient is medically cleared at this time - she is not suicidal or homicidal and may leave should she so desire - she requests long term treatment (greater than 30 days) and is clear to await placement per ACT.        Izola Price [place], [place] 04/23/11 [phone]

## 2011-04-22 NOTE — ED Notes (Signed)
Pt. Would like for friend to come back, friend was with Pt. At check in. Per pt. Request Posey Boyer will be able to visit.

## 2011-04-22 NOTE — ED Notes (Signed)
Bed:WA28<BR> Expected date:<BR> Expected time:<BR> Means of arrival:<BR> Comments:<BR> closed

## 2011-04-22 NOTE — BH Assessment (Signed)
Assessment Note   Julie Sanford is a 31 y.o. female who presents [facility] for detox from opiates(heroin, methadone, vicodin, percocet) and cocaine.  Pt denies SI/HI/Psych, no criminal/court dates.  Pt admits to using Heroin(2 bundles), [facility]), Cocaine($20), Percocet(2 pills) and Vicodin(2 pills), last use was 04/22/11 after [facility].  Pt states she only uses vicodin and percocet for toothache.  Pt says she now wants treatment so can take care of her children, they are currently with grandmother.  Pt says she began using 5 yrs ago after the death of her 57 yr old son, says he was born disabled--"he was born a vegetable".  Pt has current w/d sxs: joint pain, body aches, muscle spasms, sweats, "itchy crawling" skin and nausea.  Pt says she does have hx of seizures and blackouts experienced with withdrawals.  Pt has significant weight loss during years of SA use.    Axis I: Substance Abuse Axis II: Deferred Axis III:  Past Medical History  Diagnosis Date  . Depression    Axis IV: other psychosocial or environmental problems, problems related to social environment and problems with primary support group Axis V: 51-60 moderate symptoms  Past Medical History:  Past Medical History  Diagnosis Date  . Depression     Past Surgical History  Procedure Date  . Cesarean section     Family History: History reviewed. No pertinent family history.  Social History:  reports that she has been smoking Cigarettes.  She does not have any smokeless tobacco history on file. She reports that she uses illicit drugs (Cocaine and Heroin). She reports that she does not drink alcohol.  Additional Social History:  Alcohol / Drug Use Pain Medications: Vicodin, Percocet  Prescriptions: None  Over the Counter: None  History of alcohol / drug use?: Yes Substance #1 Name of Substance 1: Heroin  1 - Age of First Use: 8 YOF 1 - Amount (size/oz): 2  Bundles  1 - Frequency: Daily  1 - Duration: On-going  1 - Last Use / Amount: 03-10/13 Substance #2 Name of Substance 2: Methadone 2 - Age of First Use: 20's  2 - Amount (size/oz): 60mg   2 - Frequency: Daily  2 - Duration: On-going  2 - Last Use / Amount: 04/22/11 Substance #3 Name of Substance 3: Cocaine  3 - Age of First Use: 23 YOM  3 - Amount (size/oz): $20 3 - Frequency: When unable to get Heroin  3 - Duration: On-going  3 - Last Use / Amount: 04/21/11 Allergies:  Allergies  Allergen Reactions  . Tramadol Other (See Comments)    Muscle spasms and difficulty breathing    Home Medications:  Medications Prior to Admission  Medication Dose Route Frequency Provider Last Rate Last Dose  . ibuprofen (ADVIL,MOTRIN) tablet 800 mg  800 mg Oral Q8H PRN Izola Price. [place], [place]   800 mg at 04/22/11 2103  . LORazepam (ATIVAN) tablet 1 mg  1 mg Oral Q8H PRN Izola Price. [place], [place]   1 mg at 04/22/11 2103  . nicotine (NICODERM CQ - dosed in mg/24 hours) patch 21 mg  21 mg Transdermal Daily Frances C. [place], [place]   21 mg at 04/22/11 2103  . ondansetron (ZOFRAN) tablet 4 mg  4 mg Oral Q8H PRN Izola Price. [place], [place]      . zolpidem (AMBIEN) tablet 10 mg  10 mg Oral QHS PRN Izola Price. [place], [place]   10 mg at  04/22/11 2103   No current outpatient prescriptions on file as of 04/22/2011.    OB/GYN Status:  Patient's last menstrual period was 04/15/2011.  General Assessment Data Location of Assessment: [facility] Living Arrangements: Alone;Children Can pt return to current living arrangement?: Yes Admission Status: Voluntary Is patient capable of signing voluntary admission?: Yes Transfer from: Acute Hospital Referral Source: MD  Education Status Is patient currently in school?: No Current Grade: None  Highest grade of school patient has completed: Unk  Name of school: [employer]  Contact person: None   Risk to self Suicidal Ideation: No Suicidal Intent: No Is patient at risk for suicide?:  No Suicidal Plan?: No Access to Means: No What has been your use of drugs/alcohol within the last 12 months?: Abusing Heroin, Cocaine, Methadone  Previous Attempts/Gestures: No How many times?: 0  Other Self Harm Risks: None  Triggers for Past Attempts: None known Intentional Self Injurious Behavior: None Family Suicide History: No Recent stressful life event(s): Other (Comment) (SA affecting family life) Persecutory voices/beliefs?: No Depression: Yes Depression Symptoms: Loss of interest in usual pleasures Substance abuse history and/or treatment for substance abuse?: Yes Suicide prevention information given to non-admitted patients: Not applicable  Risk to Others Homicidal Ideation: No Thoughts of Harm to Others: No Current Homicidal Intent: No Current Homicidal Plan: No Access to Homicidal Means: No Identified Victim: None  History of harm to others?: No Assessment of Violence: None Noted Violent Behavior Description: None  Does patient have access to weapons?: No Criminal Charges Pending?: No Does patient have a court date: No  Psychosis Hallucinations: None noted Delusions: None noted  Mental Status Report Appear/Hygiene: Disheveled;Poor hygiene Eye Contact: Good Motor Activity: Unremarkable Speech: Logical/coherent Level of Consciousness: Alert Mood: Depressed;Sad Affect: Depressed;Sad Anxiety Level: Minimal Thought Processes: Coherent;Relevant Judgement: Unimpaired Orientation: Person;Place;Time;Situation Obsessive Compulsive Thoughts/Behaviors: None  Cognitive Functioning Concentration: Normal Memory: Recent Intact;Remote Intact IQ: Average Insight: Fair Impulse Control: Fair Appetite: Poor Weight Loss: 0  Weight Gain: 0  Sleep: No Change Total Hours of Sleep: 8  Vegetative Symptoms: None  Prior Inpatient Therapy Prior Inpatient Therapy: Yes Prior Therapy Dates: 2012 Prior Therapy Facilty/Provider(s): [facility]  Reason for Treatment: Rehab    Prior Outpatient Therapy Prior Therapy Dates: Current  [facility]  Reason for Treatment: Methadone [place]   ADL Screening (condition at time of admission) Patient's cognitive ability adequate to safely complete daily activities?: Yes Patient able to express need for assistance with ADLs?: Yes Independently performs ADLs?: Yes Weakness of Legs: None Weakness of Arms/Hands: None       Abuse/Neglect Assessment (Assessment to be complete while patient is alone) Physical Abuse: Denies Verbal Abuse: Denies Sexual Abuse: Denies Exploitation of patient/patient's resources: Denies Self-Neglect: Denies Values / Beliefs Cultural Requests During Hospitalization: None Spiritual Requests During Hospitalization: None   Advance Directives (For Healthcare) Advance Directive: Patient does not have advance directive;Patient would not like information Pre-existing out of facility DNR order (yellow form or pink MOST form): No    Additional Information 1:1 In Past 12 Months?: No CIRT Risk: No Elopement Risk: No Does patient have medical clearance?: Yes     Disposition:  Disposition Disposition of Patient: Referred to [facility] ) Patient referred to: Other (Comment) [facility] )  On Site Evaluation by:   Reviewed with Physician:     Murrell Redden 04/22/2011 11:09 PM

## 2011-04-23 ENCOUNTER — Inpatient Hospital Stay (HOSPITAL_COMMUNITY)
Admission: AD | Admit: 2011-04-23 | Discharge: 2011-04-30 | DRG: 897 | Disposition: A | Discharge status: Home / Self Care | Payer: PRIVATE HEALTH INSURANCE | Source: Ambulatory Visit | Attending: Psychiatry | Admitting: Psychiatry

## 2011-04-23 DIAGNOSIS — F39 Unspecified mood [affective] disorder: Secondary | ICD-10-CM

## 2011-04-23 DIAGNOSIS — Z888 Allergy status to other drugs, medicaments and biological substances status: Secondary | ICD-10-CM

## 2011-04-23 DIAGNOSIS — F1120 Opioid dependence, uncomplicated: Principal | ICD-10-CM | POA: Diagnosis present

## 2011-04-23 DIAGNOSIS — IMO0002 Reserved for concepts with insufficient information to code with codable children: Secondary | ICD-10-CM

## 2011-04-23 DIAGNOSIS — F19939 Other psychoactive substance use, unspecified with withdrawal, unspecified: Secondary | ICD-10-CM

## 2011-04-23 DIAGNOSIS — F4310 Post-traumatic stress disorder, unspecified: Secondary | ICD-10-CM | POA: Diagnosis present

## 2011-04-23 DIAGNOSIS — R7989 Other specified abnormal findings of blood chemistry: Secondary | ICD-10-CM | POA: Clinically undetermined

## 2011-04-23 DIAGNOSIS — F609 Personality disorder, unspecified: Secondary | ICD-10-CM

## 2011-04-23 DIAGNOSIS — F1420 Cocaine dependence, uncomplicated: Secondary | ICD-10-CM

## 2011-04-23 DIAGNOSIS — F1410 Cocaine abuse, uncomplicated: Secondary | ICD-10-CM | POA: Diagnosis present

## 2011-04-23 DIAGNOSIS — G2581 Restless legs syndrome: Secondary | ICD-10-CM

## 2011-04-23 DIAGNOSIS — D649 Anemia, unspecified: Secondary | ICD-10-CM | POA: Diagnosis present

## 2011-04-23 MED ORDER — NICOTINE 21 MG/24HR TD PT24
21.0000 mg | MEDICATED_PATCH | Freq: Every day | TRANSDERMAL | Status: DC
  Administered 2011-04-24 – 2011-04-29 (×4): 21 mg via TRANSDERMAL
  Filled 2011-04-23 (×8): qty 1

## 2011-04-23 MED ORDER — DICYCLOMINE HCL 20 MG PO TABS
20.0000 mg | ORAL_TABLET | ORAL | Status: AC | PRN
  Administered 2011-04-23 – 2011-04-28 (×5): 20 mg via ORAL
  Filled 2011-04-23 (×5): qty 1

## 2011-04-23 MED ORDER — LOPERAMIDE HCL 2 MG PO CAPS: 2.0000 mg | ORAL_CAPSULE | ORAL | Status: DC | PRN

## 2011-04-23 MED ORDER — HYDROXYZINE HCL 25 MG PO TABS
25.0000 mg | ORAL_TABLET | Freq: Three times a day (TID) | ORAL | Status: DC | PRN
  Administered 2011-04-23 – 2011-04-24 (×4): 25 mg via ORAL
  Filled 2011-04-23 (×2): qty 1

## 2011-04-23 MED ORDER — NAPROXEN 500 MG PO TABS: 500.0000 mg | ORAL_TABLET | Freq: Two times a day (BID) | ORAL | Status: DC | PRN

## 2011-04-23 MED ORDER — CLONIDINE HCL 0.1 MG PO TABS: 0.1000 mg | ORAL_TABLET | ORAL | Status: DC

## 2011-04-23 MED ORDER — ACETAMINOPHEN 325 MG PO TABS: 650.0000 mg | ORAL_TABLET | Freq: Four times a day (QID) | ORAL | Status: DC | PRN

## 2011-04-23 MED ORDER — MAGNESIUM HYDROXIDE 400 MG/5ML PO SUSP: 30.0000 mL | Freq: Every day | ORAL | Status: DC | PRN

## 2011-04-23 MED ORDER — HYDROXYZINE HCL 25 MG PO TABS: 25.0000 mg | ORAL_TABLET | Freq: Four times a day (QID) | ORAL | Status: DC | PRN

## 2011-04-23 MED ORDER — METHOCARBAMOL 500 MG PO TABS: 500.0000 mg | ORAL_TABLET | Freq: Three times a day (TID) | ORAL | Status: DC | PRN

## 2011-04-23 MED ORDER — ONDANSETRON 4 MG PO TBDP: 4.0000 mg | ORAL_TABLET | Freq: Four times a day (QID) | ORAL | Status: DC | PRN

## 2011-04-23 MED ORDER — METHOCARBAMOL 500 MG PO TABS
500.0000 mg | ORAL_TABLET | Freq: Three times a day (TID) | ORAL | Status: DC | PRN
  Administered 2011-04-23 – 2011-04-26 (×7): 500 mg via ORAL
  Filled 2011-04-23 (×8): qty 1

## 2011-04-23 MED ORDER — CLONIDINE HCL 0.1 MG PO TABS: 0.1000 mg | ORAL_TABLET | Freq: Four times a day (QID) | ORAL | Status: DC

## 2011-04-23 MED ORDER — CLONIDINE HCL 0.1 MG PO TABS
0.1000 mg | ORAL_TABLET | Freq: Four times a day (QID) | ORAL | Status: AC
  Administered 2011-04-23 – 2011-04-25 (×6): 0.1 mg via ORAL
  Filled 2011-04-23 (×8): qty 1

## 2011-04-23 MED ORDER — DICYCLOMINE HCL 20 MG PO TABS: 20.0000 mg | ORAL_TABLET | ORAL | Status: DC | PRN

## 2011-04-23 MED ORDER — ALUM & MAG HYDROXIDE-SIMETH 200-200-20 MG/5ML PO SUSP: 30.0000 mL | ORAL | Status: DC | PRN

## 2011-04-23 MED ORDER — CLONIDINE HCL 0.1 MG PO TABS
0.1000 mg | ORAL_TABLET | ORAL | Status: AC
  Administered 2011-04-25 – 2011-04-27 (×4): 0.1 mg via ORAL
  Filled 2011-04-23 (×4): qty 1

## 2011-04-23 MED ORDER — ACETAMINOPHEN 325 MG PO TABS
650.0000 mg | ORAL_TABLET | Freq: Four times a day (QID) | ORAL | Status: DC | PRN
  Administered 2011-04-23: 650 mg via ORAL

## 2011-04-23 MED ORDER — TRAZODONE HCL 100 MG PO TABS
100.0000 mg | ORAL_TABLET | Freq: Every day | ORAL | Status: DC
  Administered 2011-04-23: 100 mg via ORAL
  Filled 2011-04-23 (×3): qty 1

## 2011-04-23 MED ORDER — CLONIDINE HCL 0.1 MG PO TABS: 0.1000 mg | ORAL_TABLET | Freq: Every day | ORAL | Status: DC

## 2011-04-23 MED ORDER — CLONIDINE HCL 0.1 MG PO TABS
0.1000 mg | ORAL_TABLET | Freq: Every day | ORAL | Status: AC
  Administered 2011-04-28 – 2011-04-29 (×2): 0.1 mg via ORAL
  Filled 2011-04-23 (×2): qty 1

## 2011-04-23 MED ORDER — NAPROXEN 500 MG PO TABS
500.0000 mg | ORAL_TABLET | Freq: Two times a day (BID) | ORAL | Status: DC | PRN
  Administered 2011-04-23 – 2011-04-26 (×6): 500 mg via ORAL
  Filled 2011-04-23 (×6): qty 1

## 2011-04-23 MED ORDER — ONDANSETRON 4 MG PO TBDP
4.0000 mg | ORAL_TABLET | Freq: Four times a day (QID) | ORAL | Status: AC | PRN
  Administered 2011-04-23: 4 mg via ORAL
  Filled 2011-04-23: qty 1

## 2011-04-23 NOTE — Progress Notes (Signed)
Pt is new to the unit today for detox r/t polysubstance abuse.  Pt states she is feeling better since coming on the unit.  Pt wants to detox, then go for long term rehab.  She eventually wants to go live with her mother who has her children.  Pt denies SI/HI/AV at this time.  Explained to pt that she will be getting scheduled medications @2200  along with something to help her sleep.  Pt voices understanding.  Safety maintained with q15 minute checks.

## 2011-04-23 NOTE — Discharge Planning (Signed)
  Patient has been accepted to [facility] to Dr. Koren Shiver bed 303-1. Patient's support paperwork has been completed. EDP notified and is in agreement with disposition. EDP will discharge pt to [facility]. Pt nurse notified as well. ALL appropriate paperwork completed and forwarded to [facility] for review.   Manson Passey Kolbee Bogusz ANN S , MSW, LCSWA 04/23/2011 11:33 AM [phone]

## 2011-04-23 NOTE — BH Assessment (Signed)
31 y/o female voluntary admission. Pt requesting detox  From heroin  Opiates   benzos , amphetamines, and cocaine. She denies alcohol abuse and SI and Hi. Has a h/o   Depression ( had a  5y/o son that died 5 years ago. Stated that she was going to return to live with her mother and kids after going to teatment. On admission she was tearful  and anxious. She has several tattoo on body. She has small  Crusted over areas on back of neck and on arm from picking at her skin when anxious, also has scars from shooting up. She was cooperative on admission

## 2011-04-23 NOTE — ED Provider Notes (Signed)
Medical screening examination/treatment/procedure(s) were performed by non-physician practitioner and as supervising physician I was immediately available for consultation/collaboration.  Flint Melter, MD 04/23/11 1312

## 2011-04-24 DIAGNOSIS — R7989 Other specified abnormal findings of blood chemistry: Secondary | ICD-10-CM

## 2011-04-24 DIAGNOSIS — F1410 Cocaine abuse, uncomplicated: Secondary | ICD-10-CM

## 2011-04-24 DIAGNOSIS — F1120 Opioid dependence, uncomplicated: Principal | ICD-10-CM

## 2011-04-24 DIAGNOSIS — D649 Anemia, unspecified: Secondary | ICD-10-CM

## 2011-04-24 MED ORDER — LORAZEPAM 1 MG PO TABS
2.0000 mg | ORAL_TABLET | Freq: Once | ORAL | Status: AC
  Administered 2011-04-24: 2 mg via ORAL
  Filled 2011-04-24: qty 2

## 2011-04-24 MED ORDER — MIRTAZAPINE 15 MG PO TABS
15.0000 mg | ORAL_TABLET | Freq: Every day | ORAL | Status: DC
  Administered 2011-04-24: 15 mg via ORAL
  Filled 2011-04-24 (×2): qty 1

## 2011-04-24 MED ORDER — QUETIAPINE FUMARATE 50 MG PO TABS
50.0000 mg | ORAL_TABLET | Freq: Two times a day (BID) | ORAL | Status: DC
  Administered 2011-04-24 – 2011-04-25 (×2): 50 mg via ORAL
  Filled 2011-04-24 (×5): qty 1
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
## 2011-04-24 NOTE — Discharge Planning (Signed)
Met with Pt briefly on this day. Pt states that she came to the hospital to detox from heroin. Pt reports detox from heroine in the past and going to a methadone clinic. Pt currently lives with mother in [place]. Requests rehab for follow up. Checked into [facility].  No openings for 3 weeks.  Checked with [facility].  Men only program.  [facility] likely only option.

## 2011-04-24 NOTE — Progress Notes (Signed)
Patient ID: Julie Sanford, female   DOB: [birth year], 29 y.o.   MRN: [id number]  Has been up and about and to part of the groups. Interacting with peers and staff.  Has been c/o withdrawal symptoms and of restless leg today. Medication was given  For the anxiety and restless leg  That has reduced the symptoms.
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
## 2011-04-24 NOTE — Progress Notes (Signed)
Cosigned by Carney Bern, LCSWA 3/13/20132:32 PM
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
## 2011-04-24 NOTE — Progress Notes (Signed)
[facility] Group Notes:  (Counselor/Nursing/MHT/Case Management/Adjunct)  04/24/2011 1:11 PM  Type of Therapy:  Group Therapy  Participation Level:  None  Participation Quality:  Attentive  Affect:  Flat  Cognitive:  Appropriate and Oriented  Insight:  None  Engagement in Group:  None  Engagement in Therapy:  None  Modes of Intervention:  Education and Exploration  Summary of Progress/Problems: Patient did not verbally participate in group discussion.   Wilmon Arms 04/24/2011, 1:11 PM

## 2011-04-25 DIAGNOSIS — F4310 Post-traumatic stress disorder, unspecified: Secondary | ICD-10-CM | POA: Diagnosis present

## 2011-04-25 LAB — COMPREHENSIVE METABOLIC PANEL
AST: 12 U/L (ref 0–37)
Albumin: 3.6 g/dL (ref 3.5–5.2)
Alkaline Phosphatase: 65 U/L (ref 39–117)
Chloride: 107 mEq/L (ref 96–112)
Creatinine, Ser: 0.83 mg/dL (ref 0.50–1.10)
Potassium: 3.7 mEq/L (ref 3.5–5.1)
Total Bilirubin: 0.1 mg/dL — ABNORMAL LOW (ref 0.3–1.2)
Total Protein: 6.4 g/dL (ref 6.0–8.3)

## 2011-04-25 MED ORDER — CHLORDIAZEPOXIDE HCL 25 MG PO CAPS
25.0000 mg | ORAL_CAPSULE | Freq: Four times a day (QID) | ORAL | Status: AC
  Administered 2011-04-25 (×3): 25 mg via ORAL
  Filled 2011-04-25 (×3): qty 1

## 2011-04-25 MED ORDER — LOPERAMIDE HCL 2 MG PO CAPS: 2.0000 mg | ORAL_CAPSULE | ORAL | Status: AC | PRN

## 2011-04-25 MED ORDER — QUETIAPINE FUMARATE 100 MG PO TABS
100.0000 mg | ORAL_TABLET | Freq: Three times a day (TID) | ORAL | Status: DC
  Administered 2011-04-25 – 2011-04-29 (×12): 100 mg via ORAL
  Filled 2011-04-25 (×15): qty 1

## 2011-04-25 MED ORDER — VITAMIN B-1 100 MG PO TABS
100.0000 mg | ORAL_TABLET | Freq: Every day | ORAL | Status: DC
  Administered 2011-04-26 – 2011-04-30 (×5): 100 mg via ORAL
  Filled 2011-04-25 (×7): qty 1

## 2011-04-25 MED ORDER — CHLORDIAZEPOXIDE HCL 25 MG PO CAPS
25.0000 mg | ORAL_CAPSULE | Freq: Three times a day (TID) | ORAL | Status: AC
  Administered 2011-04-26 (×3): 25 mg via ORAL
  Filled 2011-04-25 (×3): qty 1

## 2011-04-25 MED ORDER — ADULT MULTIVITAMIN W/MINERALS CH
1.0000 | ORAL_TABLET | Freq: Every day | ORAL | Status: DC
  Administered 2011-04-25 – 2011-04-30 (×6): 1 via ORAL
  Filled 2011-04-25 (×8): qty 1

## 2011-04-25 MED ORDER — HYDROXYZINE HCL 25 MG PO TABS
25.0000 mg | ORAL_TABLET | Freq: Four times a day (QID) | ORAL | Status: DC | PRN
  Administered 2011-04-25 – 2011-04-26 (×4): 25 mg via ORAL
  Filled 2011-04-25: qty 1

## 2011-04-25 MED ORDER — CHLORDIAZEPOXIDE HCL 25 MG PO CAPS
25.0000 mg | ORAL_CAPSULE | Freq: Four times a day (QID) | ORAL | Status: DC | PRN
  Administered 2011-04-26 (×2): 25 mg via ORAL
  Filled 2011-04-25 (×2): qty 1

## 2011-04-25 MED ORDER — THIAMINE HCL 100 MG/ML IJ SOLN
100.0000 mg | Freq: Once | INTRAMUSCULAR | Status: AC
  Administered 2011-04-25: 100 mg via INTRAMUSCULAR

## 2011-04-25 MED ORDER — ONDANSETRON 4 MG PO TBDP: 4.0000 mg | ORAL_TABLET | Freq: Four times a day (QID) | ORAL | Status: DC | PRN

## 2011-04-25 MED ORDER — CHLORDIAZEPOXIDE HCL 25 MG PO CAPS
25.0000 mg | ORAL_CAPSULE | Freq: Every day | ORAL | Status: AC
  Administered 2011-04-28: 25 mg via ORAL
  Filled 2011-04-25 (×2): qty 1

## 2011-04-25 MED ORDER — MIRTAZAPINE 30 MG PO TABS
30.0000 mg | ORAL_TABLET | Freq: Every day | ORAL | Status: DC
  Administered 2011-04-25 – 2011-04-26 (×2): 30 mg via ORAL
  Filled 2011-04-25 (×3): qty 1

## 2011-04-25 MED ORDER — CHLORDIAZEPOXIDE HCL 25 MG PO CAPS
25.0000 mg | ORAL_CAPSULE | ORAL | Status: AC
  Administered 2011-04-27 (×2): 25 mg via ORAL
  Filled 2011-04-25: qty 1

## 2011-04-25 NOTE — Progress Notes (Signed)
Around 2350, pt returned to the nurse's station saying she wanted to go to the ED, because none of the meds she was being given were working and no one was listening to her.  She was angry and hostile.  She said she wanted to leave now.  Explained the protocol to her and that she could not be discharged tonight without a MD's order.  Offered the protocol meds and asked her if she wanted to speak to the [facility].  Pt took the med that was available, but it was not time for the other med.  Called the on-call MD who said to give the Naproxen early.  Pt still angry and wanting to leave, but took the med.  Offered to let her take a hot bath in the tub room to help her relax to which she agreed.  Pt stayed in the tub for about 30 min, then returned to her room.  Since then she has been in bed, but sleep is restless.  Dozing off and on.  Safety maintained with q15 minute checks.

## 2011-04-25 NOTE — Treatment Plan (Signed)
Interdisciplinary Treatment Plan Update (Adult)  Date: 04/25/2011  Time Reviewed: 8:47 AM   Progress in Treatment: Attending groups: Yes Participating in groups: Yes Taking medication as prescribed: Yes Tolerating medication: Yes   Family/Significant other contact made:  None noted Patient understands diagnosis:  Yes  As evidenced by asking for help with substance abuse Discussing patient identified problems/goals with staff:  Yes  As evidenced by asking for help with substance abuse  Medical problems stabilized or resolved:  Yes Denies suicidal/homicidal ideation: Yes  In tx team Issues/concerns per patient self-inventory:  Not filled out Other:  New problem(s) identified: N/A  Reason for Continuation of Hospitalization: Medication stabilization Withdrawal symptoms  Interventions implemented related to continuation of hospitalization: Will start librium taper today to address RLS, withdrawal symptoms.  Adjust seroquel to address mood  Encourage group attendance and participation Additional comments:  Estimated length of stay:2-3 days  Discharge Plan: Requesting referral to rehab  New goal(s): N/A  Review of initial/current patient goals per problem list:   1.  Goal(s):Safely detox from opiates  Met:  No  Target date:3/17  As evidenced by:0 on COWS, stable vitals  2.  Goal (s):Reduce depression  Met:  No  Target date:3/15  As evidenced XB:JYNWGN of 3 or less on self inventory  3.  Goal(s):Identify comprehensive sobriety plan  Met:  Yes  Target date:3/14  As evidenced by: Pt requesting transfer to rehab  4.  Goal(s):  Met:  No  Target date:  As evidenced by:  Attendees: Patient:  Julie Sanford 04/25/2011 8:47 AM  Family:     Physician:  Lupe Carney 04/25/2011 8:47 AM   Nursing:  Roswell Miners  04/25/2011 8:47 AM   Case Manager:  Richelle Ito, LCSW 04/25/2011 8:47 AM   Counselor:  Ronda Fairly, LCSWA 04/25/2011 8:47 AM   Other:  Verne Spurr  04/25/2011 8:47 AM  Other:     Other:     Other:      Scribe for Treatment Team:   Ida Rogue, 04/25/2011 8:47 AM

## 2011-04-25 NOTE — Progress Notes (Signed)
During the first part of the shift, pt was observed participating appropriately with unit activities.  She attended group and, afterwards, she was playing cards with her peers in the dayroom.  She had no complaints until bedtime when she came to the nurse's station wanting to know if she could get her Robaxin and Naproxen early.  Explained to pt that the meds were to be given according to their schedule.  She was complaining of the muscles in her legs cramping and spasms.  She had this same problem last night.  Pt asked if the meds could be brought to her, and RN agreed to do this.  Will continue to monitor for safety q15 minutes.

## 2011-04-25 NOTE — Progress Notes (Signed)
[facility] Group Notes:  (Counselor/Nursing/MHT/Case Management/Adjunct)  04/25/2011 12:52 PM  Type of Therapy:  Group Therapy  Participation Level:  Did Not Attend  Julie Sanford 04/25/2011, 12:52 PM

## 2011-04-25 NOTE — Progress Notes (Signed)
[facility] MD Progress Note  04/25/2011 3:26 PM  Diagnosis:  Polysubstance Dependence; Opioid, Cocaine and Stimulant W/D; r/o PTSD   ADL's  Impaired  Sleep: Poor  Appetite:  Poor  Suicidal Ideation:  denies Homicidal Ideation:  denies  AEB (as evidenced by): Subjective: Julie Sanford met with the treatment team this morning.  She stated that she is having significant withdrawal symptoms especially muscle spasms and that she has not slept well.  She would like to leave if her symptoms do not improve.  She is requesting Ativan due to the increased muscle spasms. She does note that the seroquel helped some, but she was taking much higher doses prior to admission. Mental Status Examination/Evaluation: Objective:  Appearance: Disheveled  Eye Contact::  Good  Speech:  Clear and Coherent  Volume:  Normal  Mood:  Anxious  Affect:  Congruent  Thought Process:  Coherent  Orientation:  Full  Thought Content:  WDL  Suicidal Thoughts:  No  Homicidal Thoughts:  No  Memory:  Immediate;   Fair  Judgement:  Poor  Insight:  Lacking  Psychomotor Activity:  Increased  Concentration:  Poor  Recall:  Fair  Akathisia:  No  Handed:    AIMS (if indicated):     Assets:  Desire for Improvement Housing  Sleep:  Number of Hours: 1.75    Vital Signs:Blood pressure 104/69, pulse 102, temperature 98.1 F (36.7 C), temperature source Oral, resp. rate 24, height 5' 0.63" (1.54 m), weight 45.558 kg (100 lb 7 oz), last menstrual period 04/15/2011. Objective: Pt. Met with the treatment team this morning and does elect to stay on the unit through her detox and she will consider her options on further treatment upon discharge. Current Medications: Current Facility-Administered Medications  Medication Dose Route Frequency Provider Last Rate Last Dose  . acetaminophen (TYLENOL) tablet 650 mg  650 mg Oral Q6H PRN Sanjuana Kava, NP   650 mg at 04/23/11 2127  . alum & mag hydroxide-simeth (MAALOX/MYLANTA) 200-200-20  MG/5ML suspension 30 mL  30 mL Oral Q4H PRN Sanjuana Kava, NP      . chlordiazePOXIDE (LIBRIUM) capsule 25 mg  25 mg Oral Q6H PRN Verne Spurr, PA      . chlordiazePOXIDE (LIBRIUM) capsule 25 mg  25 mg Oral QID Verne Spurr, PA   25 mg at 04/25/11 1153   Followed by  . chlordiazePOXIDE (LIBRIUM) capsule 25 mg  25 mg Oral TID Verne Spurr, PA       Followed by  . chlordiazePOXIDE (LIBRIUM) capsule 25 mg  25 mg Oral BH-qamhs Verne Spurr, PA       Followed by  . chlordiazePOXIDE (LIBRIUM) capsule 25 mg  25 mg Oral Daily Verne Spurr, [place]      . cloNIDine (CATAPRES) tablet 0.1 mg  0.1 mg Oral QID Verne Spurr, PA   0.1 mg at 04/25/11 1152   Followed by  . cloNIDine (CATAPRES) tablet 0.1 mg  0.1 mg Oral BH-qamhs Verne Spurr, PA       Followed by  . cloNIDine (CATAPRES) tablet 0.1 mg  0.1 mg Oral QAC breakfast Verne Spurr, PA      . dicyclomine (BENTYL) tablet 20 mg  20 mg Oral Q4H PRN Verne Spurr, PA   20 mg at 04/24/11 0110  . hydrOXYzine (ATARAX/VISTARIL) tablet 25 mg  25 mg Oral Q6H PRN Verne Spurr, PA   25 mg at 04/25/11 1408  . loperamide (IMODIUM) capsule 2-4 mg  2-4 mg Oral PRN Verne Spurr,  PA      . magnesium hydroxide (MILK OF MAGNESIA) suspension 30 mL  30 mL Oral Daily PRN Sanjuana Kava, NP      . methocarbamol (ROBAXIN) tablet 500 mg  500 mg Oral Q8H PRN Verne Spurr, PA   500 mg at 04/25/11 1352  . mirtazapine (REMERON) tablet 30 mg  30 mg Oral QHS Verne Spurr, PA      . mulitivitamin with minerals tablet 1 tablet  1 tablet Oral Daily Verne Spurr, [place]   1 tablet at 04/25/11 1153  . naproxen (NAPROSYN) tablet 500 mg  500 mg Oral BID PRN Verne Spurr, PA   500 mg at 04/25/11 1157  . nicotine (NICODERM CQ - dosed in mg/24 hours) patch 21 mg  21 mg Transdermal Q0600 Alyson Kuroski-Mazzei, DO   21 mg at 04/25/11 0622  . ondansetron (ZOFRAN-ODT) disintegrating tablet 4 mg  4 mg Oral Q6H PRN Verne Spurr, PA   4 mg at 04/23/11 1645  . QUEtiapine (SEROQUEL) tablet 100 mg   100 mg Oral TID Verne Spurr, PA   100 mg at 04/25/11 1153  . thiamine (B-1) injection 100 mg  100 mg Intramuscular Once Verne Spurr, PA   100 mg at 04/25/11 1311  . thiamine (VITAMIN B-1) tablet 100 mg  100 mg Oral Daily Verne Spurr, [place]      . DISCONTD: hydrOXYzine (ATARAX/VISTARIL) tablet 25 mg  25 mg Oral TID PRN Sanjuana Kava, NP   25 mg at 04/24/11 2140  . DISCONTD: loperamide (IMODIUM) capsule 2-4 mg  2-4 mg Oral PRN Verne Spurr, PA      . DISCONTD: mirtazapine (REMERON) tablet 15 mg  15 mg Oral QHS Alyson Kuroski-Mazzei, DO   15 mg at 04/24/11 2140  . DISCONTD: ondansetron (ZOFRAN-ODT) disintegrating tablet 4 mg  4 mg Oral Q6H PRN Verne Spurr, PA      . DISCONTD: QUEtiapine (SEROQUEL) tablet 50 mg  50 mg Oral BID Verne Spurr, PA   50 mg at 04/25/11 1610   Lab Results:  Results for orders placed during the hospital encounter of 04/23/11 (from the past 48 hour(s))  COMPREHENSIVE METABOLIC PANEL     Status: Abnormal   Collection Time   04/25/11  6:15 AM      Component Value Range Comment   Sodium 139  135 - 145 (mEq/L)    Potassium 3.7  3.5 - 5.1 (mEq/L)    Chloride 107  96 - 112 (mEq/L)    CO2 23  19 - 32 (mEq/L)    Glucose, Bld 84  70 - 99 (mg/dL)    BUN 12  6 - 23 (mg/dL)    Creatinine, Ser 9.60  0.50 - 1.10 (mg/dL)    Calcium 8.4  8.4 - 10.5 (mg/dL)    Total Protein 6.4  6.0 - 8.3 (g/dL)    Albumin 3.6  3.5 - 5.2 (g/dL)    AST 12  0 - 37 (U/L)    ALT 7  0 - 35 (U/L)    Alkaline Phosphatase 65  39 - 117 (U/L)    Total Bilirubin 0.1 (*) 0.3 - 1.2 (mg/dL)    GFR calc non Af Amer >90  >90 (mL/min)    GFR calc Af Amer >90  >90 (mL/min)     CIWA:  CIWA-Ar Total: 5  COWS:  COWS Total Score: 5   Treatment Plan Summary: Pt.'s remeron was increased to 30mg  at hs., She also had her Seroquel increased to 100mg   TID and she was also placed on a librium taper to cover for possible benzodiazepine withdrawal as well.  Plan: Changes were made as noted above and will continue  through with detox as written.  Jerrick Farve 04/25/2011, 3:26 PM

## 2011-04-25 NOTE — Progress Notes (Signed)
Patient ID: Julie Sanford, female   DOB: 08-21-1980, 31 y.o.   MRN: [id number]  She has been up and about and to parts of the groups. She told Phy.. About her restless  and new orders were wrote  Medication was given at lunch time and she appears less anxious and stated that her legs  Hurt less.

## 2011-04-25 NOTE — Progress Notes (Signed)
[facility] Group Notes:  (Counselor/Nursing/MHT/Case Management/Adjunct)  04/24/2011   Type of Therapy:  Group Therapy at 1:15PM on Wed 04/24/11  Participation Level:  Did Not Attend  Clide Dales 04/25/2011, 10:35 AM

## 2011-04-25 NOTE — Treatment Plan (Signed)
Julie Sanford did not attend AM group, but came to tx team.  Having a difficult time with withdrawals.  Has continuous spasms in legs that do not allow her to sit still or sleep.  Only relief is benzos, and to address this Dr ordered librium taper to start today, and will work with her on using nuerontin post taper to address spasms/RLS.  [name] is requesting referral to rehab directly from here.  Options are [facility] and [facility].

## 2011-04-25 NOTE — Progress Notes (Signed)
[facility] Group Notes:  (Counselor/Nursing/MHT/Case Management/Adjunct)  04/25/2011 4:08 PM  Type of Therapy:  1:15PM Group Therapy  Participation Level:  Minimal  Participation Quality:  Appropriate and Attentive  Affect:  Flat  Cognitive:  Appropriate  Insight:  Limited  Engagement in Group:  Limited  Engagement in Therapy:  Limited  Modes of Intervention:  Clarification, Education, Support and Exploration  Summary of Progress/Problems: Patient seemed to relate well with the thoughts and ideas of her peers. In discussing life and balance, she chose a picture of the sunset over the ocean. Patient stated this photo represented what her life would look like if it were balanced. Patient did not engage much in the discussion amongst the group members; however, she seemed to often nod in agreement.   Wilmon Arms 04/25/2011, 4:08 PM

## 2011-04-25 NOTE — Progress Notes (Signed)
04/25/2011         Time: 1415      Group Topic/Focus: The focus of this group is on discussing various styles of communication and communicating assertively using 'I' (feeling) statements.  Participation Level: Minimal  Participation Quality: Resistant and Intrusive Affect: Irritable   Cognitive: Oriented   Additional Comments: Patient states, "I'm here for detox and to lay in the bed, not to go to groups." Patient disrespectful to RT, talking over her through much of group, requiring redirection often.    Coolidge Gossard 04/25/2011 3:58 PM

## 2011-04-26 LAB — DIFFERENTIAL
Basophils Relative: 1 % (ref 0–1)
Lymphocytes Relative: 43 % (ref 12–46)
Lymphs Abs: 1.9 10*3/uL (ref 0.7–4.0)
Monocytes Relative: 6 % (ref 3–12)
Neutro Abs: 2 10*3/uL (ref 1.7–7.7)

## 2011-04-26 LAB — CBC
MCH: 20.9 pg — ABNORMAL LOW (ref 26.0–34.0)
RBC: 4.49 MIL/uL (ref 3.87–5.11)

## 2011-04-26 MED ORDER — OLANZAPINE 10 MG PO TBDP
10.0000 mg | ORAL_TABLET | Freq: Once | ORAL | Status: AC
  Administered 2011-04-26: 10 mg via ORAL

## 2011-04-26 MED ORDER — OLANZAPINE 5 MG PO TBDP: 5.0000 mg | ORAL_TABLET | Freq: Every day | ORAL | Status: DC

## 2011-04-26 MED ORDER — QUETIAPINE FUMARATE 200 MG PO TABS
200.0000 mg | ORAL_TABLET | Freq: Once | ORAL | Status: AC
  Administered 2011-04-26: 200 mg via ORAL
  Filled 2011-04-26 (×2): qty 1

## 2011-04-26 MED ORDER — METHOCARBAMOL 500 MG PO TABS
1000.0000 mg | ORAL_TABLET | Freq: Four times a day (QID) | ORAL | Status: DC | PRN
  Administered 2011-04-26 – 2011-04-30 (×10): 1000 mg via ORAL
  Filled 2011-04-26 (×5): qty 1
  Filled 2011-04-26: qty 2
  Filled 2011-04-26: qty 1
  Filled 2011-04-26 (×3): qty 2
  Filled 2011-04-26: qty 1
  Filled 2011-04-26 (×2): qty 2
  Filled 2011-04-26: qty 1

## 2011-04-26 MED ORDER — OLANZAPINE 5 MG PO TBDP
5.0000 mg | ORAL_TABLET | Freq: Three times a day (TID) | ORAL | Status: DC | PRN
  Administered 2011-04-26 – 2011-04-29 (×7): 5 mg via ORAL
  Filled 2011-04-26 (×7): qty 1

## 2011-04-26 MED ORDER — OLANZAPINE 5 MG PO TBDP: 5.0000 mg | ORAL_TABLET | Freq: Three times a day (TID) | ORAL | Status: DC | PRN

## 2011-04-26 MED ORDER — OLANZAPINE 5 MG PO TBDP
5.0000 mg | ORAL_TABLET | Freq: Once | ORAL | Status: DC
  Filled 2011-04-26: qty 1

## 2011-04-26 MED ORDER — OLANZAPINE 10 MG PO TBDP
ORAL_TABLET | ORAL | Status: AC
  Filled 2011-04-26: qty 1

## 2011-04-26 NOTE — Progress Notes (Signed)
[facility] Group Notes:  (Counselor/Nursing/MHT/Case Management/Adjunct)  04/26/2011 2:59 PM  Type of Therapy:  1:15PM Group Therapy  Participation Level:  Did Not Attend  Julie Sanford 04/26/2011, 2:59 PM

## 2011-04-26 NOTE — Progress Notes (Signed)
[facility] Group Notes:  (Counselor/Nursing/MHT/Case Management/Adjunct)  04/26/2011 12:06 PM  Type of Therapy:  Group Therapy  Participation Level:  Did Not Attend  Wilmon Arms 04/26/2011, 12:06 PM

## 2011-04-26 NOTE — Progress Notes (Signed)
1:1 d/c'd per MD verbal order. Clear direction given to pt concerning behavior and unit expectations.  Pt verbalized understanding.

## 2011-04-26 NOTE — Progress Notes (Signed)
Nsg 1:1 initiation- Patient has been continuously loud and beligerent, c/o pain,anxiety, spasms, and "restless leg".  All prn medications available requested and received.  72 hour signed.  Verbal deescalation done.  Cont. to threaten and provider implemented 1:1. Pt refused non-medication comfort measures of warm bath, hot compresses and relaxing in room. Adamantly denies SI. Cont 15' checks and 1:1 for safety.

## 2011-04-26 NOTE — Progress Notes (Signed)
Dozing at very brief intervals while thrashing about in bed almost continuously.  Crying and complaining of intense pain in both upper arms (10/10), inability to sleep or rest, and restless legs.   Mood and affect reveal anxiety, antagonism, helplessness, anger and frustration.  Verne Spurr,  PA notified and ordered Seroquel 200 mg  Now.  CIWA = 8.

## 2011-04-26 NOTE — Progress Notes (Signed)
Pt has calmed down considerably since initial 1:1. Requested and received prn Robaxin for back pain. Soft drink per pt request. 1:1 cont for safety.

## 2011-04-26 NOTE — Progress Notes (Signed)
Patient screaming, yelling, cussing, and attempting to trash her room.  Disturbing milieu with loud, angry, profane  complaints against doctors and staff.  Begging to be sent to [facility].  B/P 145/90, Pulse 97 (lying). Verne Spurr notified of elevated blood pressure and COW of 11.

## 2011-04-26 NOTE — Progress Notes (Signed)
Pt quieter but cont to c/o pain. Fluids offered. 1:1 cont for safety and threats to elope.

## 2011-04-26 NOTE — Progress Notes (Signed)
Awake since beginning of night shift complaining bitterly of "muscle spasms" in legs and body.  Threatening to "walk out of here" if more or different medication is not given / prescribed.  Banging on bathroom walls, slamming doors, and using profanity.  Refuses offer of warm bath.  Was given Librium 25 mg at 00:27 and Naproxin  500 mg, now.  Continues to argue and flail arms, threatening to "leave this place now---y'all can't stop me."  With 1:1 verbal support, encouragement,  and positive regard, she agrees to lie in bed "for one hour then I'm leaving out of here."  Mood and affect are angry, agitated, and scared.  Denies SI or HI.

## 2011-04-27 DIAGNOSIS — F1920 Other psychoactive substance dependence, uncomplicated: Secondary | ICD-10-CM

## 2011-04-27 LAB — T4, FREE: Free T4: 1.26 ng/dL (ref 0.80–1.80)

## 2011-04-27 LAB — T3: T3, Total: 101 ng/dl (ref 80.0–204.0)

## 2011-04-27 MED ORDER — MIRTAZAPINE 30 MG PO TABS
45.0000 mg | ORAL_TABLET | Freq: Every day | ORAL | Status: DC
  Administered 2011-04-27 – 2011-04-29 (×3): 45 mg via ORAL
  Filled 2011-04-27 (×3): qty 1
  Filled 2011-04-27: qty 21
  Filled 2011-04-27: qty 1

## 2011-04-27 MED ORDER — TRAZODONE HCL 100 MG PO TABS
100.0000 mg | ORAL_TABLET | Freq: Every day | ORAL | Status: DC
  Administered 2011-04-27: 100 mg via ORAL
  Filled 2011-04-27 (×3): qty 1

## 2011-04-27 MED ORDER — DIPHENHYDRAMINE HCL 25 MG PO CAPS
25.0000 mg | ORAL_CAPSULE | Freq: Four times a day (QID) | ORAL | Status: DC | PRN
  Administered 2011-04-27 – 2011-04-28 (×2): 25 mg via ORAL

## 2011-04-27 MED ORDER — IBUPROFEN 600 MG PO TABS
600.0000 mg | ORAL_TABLET | Freq: Three times a day (TID) | ORAL | Status: DC | PRN
  Administered 2011-04-27 – 2011-04-30 (×6): 600 mg via ORAL
  Filled 2011-04-27 (×7): qty 1

## 2011-04-27 MED ORDER — ROPINIROLE HCL ER 8 MG PO TB24
8.0000 mg | ORAL_TABLET | Freq: Every day | ORAL | Status: DC
  Administered 2011-04-27: 8 mg via ORAL
  Filled 2011-04-27 (×3): qty 1

## 2011-04-27 MED ORDER — ROPINIROLE HCL 1 MG PO TABS
1.0000 mg | ORAL_TABLET | Freq: Three times a day (TID) | ORAL | Status: AC
  Administered 2011-04-27 (×2): 1 mg via ORAL
  Filled 2011-04-27 (×2): qty 1

## 2011-04-27 NOTE — Progress Notes (Addendum)
Spoke to pt about possible D/C today or tomorrow due to her signing her 72 hr request for D/C. She stated that she would like to go to [facility] and would like to go home over the weekend to spend time with her children. Pt states that she feel ready for D/C no S/I and or H/I. She also stated that she feels she can stay sober at home for her mother "wont allow drugs in the home", pt will be going to her mothers home with her children. Her boyfriend who she reported does not use will provide transportation, however on assessment pt reported he does use. Pt stated that she is "ok" calling [facility] to get in there on her own. Pt was advised to consider staying at [facility] over the weekend for she is still experiencing leg tremors and reports she has not slept in 2-3 nights. Also pt was advised that it might be better to go directly from here to [facility]. Pt agreed to think about it and talk with the MD today.  Pt. accepted information on suicide prevention, warning signs to look for with suicide and crisis line numbers to use. The pt. agreed to call crisis line numbers if having warning signs or having thoughts of suicide. Pt stated that she did not want her mother called for her mother would say to "keep her here at [facility]". When asked why pt stated that her mother worries about her and that she would want her to stay due to her many relapses and long history of use and failed tx.

## 2011-04-27 NOTE — Progress Notes (Signed)
Patient ID: Julie Sanford, female   DOB: 01-31-1981, 31 y.o.   MRN: [id number] Has been to the med window frequently this evening, asking for prn meds, some of which have been d/c'd, such as vistaril and librium, which she had requested for anxiety/ insomnia.  Has had robaxin for muscle spasms but she states nothing is helping.  Obtained order for trazadone which she states she has had all week and didn't help her sleep, but hasn't been ordered while she has been here, accusing me of lying.Marland Kitchen  Has been irritable this evening, was caught with a pack of cigarettes after tech smelled smoke in the hall.  Threatening to leave in the am.  Has been awake all evening, talking to female peer next door in the hall, woke others.  Have tried to encourage her to sleep.  Will continue to monitor.

## 2011-04-27 NOTE — Progress Notes (Addendum)
Patient ID: Julie Sanford, female   DOB: Jun 12, 1980, 31 y.o.   MRN: [id number] Has been out on the hall interacting appropriately with peers and staff.  States is feeling a little better and that the requip is helping with muscle spasms.  Talking about going into long term , wants to get straight for her kids, seemed more focused at that time, but then refused group, saying she had been to groups all day.  Needed some encouragement, but attended AA. Will continue to monitor.  0600... Update.  Has been awake all night , became more irritable as the night progressed.  Took all of her hs meds, scheduled as well as prn meds. Was loud and demanding, wanting more meds, even though she had been given evetything and additional meds were ordered for her. Cursing at staff, angry, wanting pain meds but admitted she knew she couldn't have them, angry that MD had only ordered motrin, refused it several times before taking it. Marland Kitchen

## 2011-04-27 NOTE — Progress Notes (Signed)
Patient ID: Julie Sanford, female   DOB: 06-Dec-1980, 31 y.o.   MRN: [id number] Pt. attended and participated in aftercare planning group. Pt. accepted information on suicide prevention, warning signs to look for with suicide and crisis line numbers to use. The pt. agreed to call crisis line numbers if having warning signs or having thoughts of suicide. Pt. listed their current anxiety level as low.  Pt. accepted N/A meeting schedule.

## 2011-04-27 NOTE — Progress Notes (Signed)
[facility] MD Progress Note  04/27/2011 10:22 AM  Diagnosis:   Axis I: See current hospital problem list Axis II: Deferred Axis III:  Past Medical History  Diagnosis Date  . Depression    Axis IV: Unchanged Axis V: 41-50 serious symptoms  ADL's:  Intact  Sleep: Poor  Appetite:  Poor  Suicidal Ideation:   Homicidal Ideation:    AEB (as evidenced by):Julie Sanford is complaining of withdrawal symptoms including poor sleep, poor appetite, and especially leg and arm cramps. She does endorse that she feels better today than she did yesterday, but she is still experiencing severe withdrawal. She complains that she is unable to smoke cigarettes. She reports that she plans to go back on methadone after she leaves this facility. After some discussion she is considering the possibility of going to a 28 day treatment facility if we can arrange that. She denies any suicidal or homicidal ideation. She denies any auditory or visual hallucinations.  Mental Status Examination/Evaluation: Objective:  Appearance: Disheveled  Eye Contact::  Good  Speech:  Clear and Coherent  Volume:  Normal  Mood:  Anxious, Depressed and Irritable  Affect:  Constricted  Thought Process:  Circumstantial  Orientation:  Full  Thought Content:  WDL  Suicidal Thoughts:  No  Homicidal Thoughts:  No  Memory:  Remote;   Good  Judgement:  Impaired  Insight:  Lacking  Psychomotor Activity:  Normal  Concentration:  Good  Recall:  Good  Akathisia:  No  Handed:    AIMS (if indicated):     Assets:  Physical Health  Sleep:  Number of Hours: 1.25    Vital Signs:Blood pressure 130/87, pulse 80, temperature 96.7 F (35.9 C), temperature source Oral, resp. rate 18, height 5' 0.63" (1.54 m), weight 45.558 kg (100 lb 7 oz), last menstrual period 04/15/2011. Current Medications: Current Facility-Administered Medications  Medication Dose Route Frequency Provider Last Rate Last Dose  . alum & mag hydroxide-simeth (MAALOX/MYLANTA)  200-200-20 MG/5ML suspension 30 mL  30 mL Oral Q4H PRN Sanjuana Kava, NP      . chlordiazePOXIDE (LIBRIUM) capsule 25 mg  25 mg Oral TID Verne Spurr, PA   25 mg at 04/26/11 1715   Followed by  . chlordiazePOXIDE (LIBRIUM) capsule 25 mg  25 mg Oral BH-qamhs Verne Spurr, [place]   25 mg at 04/27/11 0802   Followed by  . chlordiazePOXIDE (LIBRIUM) capsule 25 mg  25 mg Oral Daily Verne Spurr, [place]      . cloNIDine (CATAPRES) tablet 0.1 mg  0.1 mg Oral BH-qamhs Verne Spurr, PA   0.1 mg at 04/27/11 0802   Followed by  . cloNIDine (CATAPRES) tablet 0.1 mg  0.1 mg Oral QAC breakfast Verne Spurr, PA      . dicyclomine (BENTYL) tablet 20 mg  20 mg Oral Q4H PRN Verne Spurr, PA   20 mg at 04/27/11 0314  . loperamide (IMODIUM) capsule 2-4 mg  2-4 mg Oral PRN Verne Spurr, PA      . magnesium hydroxide (MILK OF MAGNESIA) suspension 30 mL  30 mL Oral Daily PRN Sanjuana Kava, NP      . methocarbamol (ROBAXIN) tablet 1,000 mg  1,000 mg Oral Q6H PRN Jorje Guild, PA   1,000 mg at 04/27/11 0100  . mirtazapine (REMERON) tablet 45 mg  45 mg Oral QHS Jorje Guild, PA      . mulitivitamin with minerals tablet 1 tablet  1 tablet Oral Daily Verne Spurr, [place]   1 tablet  at 04/26/11 0851  . nicotine (NICODERM CQ - dosed in mg/24 hours) patch 21 mg  21 mg Transdermal Q0600 Alyson Kuroski-Mazzei, DO   21 mg at 04/27/11 0629  . OLANZapine zydis (ZYPREXA) disintegrating tablet 10 mg  10 mg Oral Once [employer], PA   10 mg at 04/26/11 1210  . OLANZapine zydis (ZYPREXA) disintegrating tablet 5 mg  5 mg Oral Q8H PRN Alyson Kuroski-Mazzei, DO   5 mg at 04/27/11 0102  . ondansetron (ZOFRAN-ODT) disintegrating tablet 4 mg  4 mg Oral Q6H PRN Verne Spurr, PA   4 mg at 04/23/11 1645  . QUEtiapine (SEROQUEL) tablet 100 mg  100 mg Oral TID Verne Spurr, PA   100 mg at 04/27/11 0801  . rOPINIRole (REQUIP XL) 24 hr tablet 8 mg  8 mg Oral QHS Jorje Guild, PA      . rOPINIRole (REQUIP) tablet 1 mg  1 mg Oral TID Jorje Guild, PA      .  thiamine (VITAMIN B-1) tablet 100 mg  100 mg Oral Daily Verne Spurr, PA   100 mg at 04/27/11 0804  . DISCONTD: acetaminophen (TYLENOL) tablet 650 mg  650 mg Oral Q6H PRN Sanjuana Kava, NP   650 mg at 04/23/11 2127  . DISCONTD: chlordiazePOXIDE (LIBRIUM) capsule 25 mg  25 mg Oral Q6H PRN Verne Spurr, PA   25 mg at 04/26/11 1610  . DISCONTD: hydrOXYzine (ATARAX/VISTARIL) tablet 25 mg  25 mg Oral Q6H PRN Verne Spurr, PA   25 mg at 04/26/11 1030  . DISCONTD: methocarbamol (ROBAXIN) tablet 500 mg  500 mg Oral Q8H PRN Verne Spurr, PA   500 mg at 04/26/11 1030  . DISCONTD: mirtazapine (REMERON) tablet 30 mg  30 mg Oral QHS Verne Spurr, PA   30 mg at 04/26/11 2119  . DISCONTD: naproxen (NAPROSYN) tablet 500 mg  500 mg Oral BID PRN Verne Spurr, PA   500 mg at 04/26/11 0058  . DISCONTD: OLANZapine zydis (ZYPREXA) disintegrating tablet 5 mg  5 mg Oral QHS Alyson Kuroski-Mazzei, DO      . DISCONTD: OLANZapine zydis (ZYPREXA) disintegrating tablet 5 mg  5 mg Oral Once [employer], DO      . DISCONTD: OLANZapine zydis (ZYPREXA) disintegrating tablet 5 mg  5 mg Oral Q8H PRN Alyson Kuroski-Mazzei, DO      . DISCONTD: traZODone (DESYREL) tablet 100 mg  100 mg Oral QHS Curlene Labrum Readling, MD   100 mg at 04/27/11 0230    Lab Results:  Results for orders placed during the hospital encounter of 04/23/11 (from the past 48 hour(s))  T3     Status: Normal   Collection Time   04/26/11  4:22 PM      Component Value Range Comment   T3, Total 101.0  80.0 - 204.0 (ng/dl)   T4, FREE     Status: Normal   Collection Time   04/26/11  4:22 PM      Component Value Range Comment   Free T4 1.26  0.80 - 1.80 (ng/dL)   TSH     Status: Abnormal   Collection Time   04/26/11  4:22 PM      Component Value Range Comment   TSH 0.123 (*) 0.350 - 4.500 (uIU/mL)   CBC     Status: Abnormal   Collection Time   04/26/11  4:22 PM      Component Value Range Comment   WBC 4.3  4.0 - 10.5 (K/uL)  RBC 4.49  3.87 - 5.11  (MIL/uL)    Hemoglobin 9.4 (*) 12.0 - 15.0 (g/dL)    HCT 16.1 (*) 09.6 - 46.0 (%)    MCV 67.5 (*) 78.0 - 100.0 (fL)    MCH 20.9 (*) 26.0 - 34.0 (pg)    MCHC 31.0  30.0 - 36.0 (g/dL)    RDW 04.5 (*) 40.9 - 15.5 (%)    Platelets 182  150 - 400 (K/uL)   DIFFERENTIAL     Status: Normal   Collection Time   04/26/11  4:22 PM      Component Value Range Comment   Neutrophils Relative 48  43 - 77 (%)    Lymphocytes Relative 43  12 - 46 (%)    Monocytes Relative 6  3 - 12 (%)    Eosinophils Relative 2  0 - 5 (%)    Basophils Relative 1  0 - 1 (%)    Neutro Abs 2.0  1.7 - 7.7 (K/uL)    Lymphs Abs 1.9  0.7 - 4.0 (K/uL)    Monocytes Absolute 0.3  0.1 - 1.0 (K/uL)    Eosinophils Absolute 0.1  0.0 - 0.7 (K/uL)    Basophils Absolute 0.0  0.0 - 0.1 (K/uL)    RBC Morphology ROULEAUX       Physical Findings: AIMS:  , ,  ,  ,    CIWA:  CIWA-Ar Total: 3  COWS:  COWS Total Score: 19   Treatment Plan Summary: Daily contact with patient to assess and evaluate symptoms and progress in treatment Medication management  Plan: We will increase her Remeron to 45 mg at bedtime, and stop the trazodone. We will give her 21 mg doses of Requip today at noon and 5 PM, then a 24 hour 8 mg dose at bedtime tonight. We will work to arrange potential followup plans, hopefully at a 28 day treatment facility, and review with the patient tomorrow Julie Sanford 04/27/2011, 10:22 AM

## 2011-04-27 NOTE — Progress Notes (Signed)
Patient ID: Julie Sanford, female   DOB: 10-03-1980, 31 y.o.   MRN: [id number]   [facility] Group Notes:  (Counselor/Nursing/MHT/Case Management/Adjunct)  04/27/2011 1:15 PM  Type of Therapy:  Group Therapy, Dance/Movement Therapy   Participation Level:  Active  Participation Quality:  Redirectable  Affect:  Labile  Cognitive:  Oriented  Insight:  Limited  Engagement in Group:  Limited  Engagement in Therapy:  Limited  Modes of Intervention:  Clarification, Problem-solving, Role-play, Socialization and Support  Summary of Progress/Problems:  Group discussed triggers and obstacles in their recovery and brainstormed ways to deal with them. Pt shared that the loss of her two children is what caused her relapse. She also shared that she wants to recover but she does not like being here and was constantly distracted by a fellow pt in the hall. Pt's words and non-verbal dialogue was inconsistent, which suggests that she is unclear and not focused on her recovery.  Julie Sanford, [employer]

## 2011-04-27 NOTE — Progress Notes (Signed)
Pt behavior is greatly improved from previous shifts.  Cont to have same complaints of "spasms" and insomnia. New medication orders reviewed and pt education done.  Denies SI. Attended groups. Redirected attention from female peer.  Praise and encouragement given.  Requested and received prn medication for back pain. Support offered and 15' checks given for safety.

## 2011-04-28 MED ORDER — ROPINIROLE HCL 1 MG PO TABS
2.0000 mg | ORAL_TABLET | Freq: Every day | ORAL | Status: DC
  Administered 2011-04-28 – 2011-04-29 (×2): 2 mg via ORAL
  Filled 2011-04-28 (×3): qty 2
  Filled 2011-04-28: qty 14

## 2011-04-28 MED ORDER — OLANZAPINE 10 MG PO TABS
10.0000 mg | ORAL_TABLET | Freq: Every day | ORAL | Status: DC
  Administered 2011-04-28: 10 mg via ORAL
  Filled 2011-04-28 (×2): qty 1

## 2011-04-28 MED ORDER — DIPHENHYDRAMINE HCL 50 MG PO CAPS
50.0000 mg | ORAL_CAPSULE | Freq: Every day | ORAL | Status: DC
  Administered 2011-04-28: 50 mg via ORAL
  Filled 2011-04-28 (×2): qty 1

## 2011-04-28 MED ORDER — DIPHENHYDRAMINE HCL 50 MG PO CAPS
50.0000 mg | ORAL_CAPSULE | ORAL | Status: AC
  Administered 2011-04-28: 50 mg via ORAL
  Filled 2011-04-28: qty 1

## 2011-04-28 MED ORDER — ROPINIROLE HCL 1 MG PO TABS
1.0000 mg | ORAL_TABLET | Freq: Three times a day (TID) | ORAL | Status: AC
  Administered 2011-04-28 (×2): 1 mg via ORAL
  Filled 2011-04-28 (×2): qty 1

## 2011-04-28 MED ORDER — QUETIAPINE FUMARATE 50 MG PO TABS
ORAL_TABLET | ORAL | Status: AC
  Administered 2011-04-28: 50 mg via ORAL
  Filled 2011-04-28: qty 1

## 2011-04-28 MED ORDER — QUETIAPINE FUMARATE 50 MG PO TABS
50.0000 mg | ORAL_TABLET | ORAL | Status: AC
  Administered 2011-04-28: 50 mg via ORAL
  Filled 2011-04-28: qty 1

## 2011-04-28 NOTE — Progress Notes (Signed)
Pt approached nurses desk [phone] stating "I finally fell asleep." Pt in room with eyes closed approximately 6 hours. Anxious but behavior is appropriate. No prn medications requested so far.  Denies SI.  No group attendance this am. Support offered and 15' checks cont for safety.

## 2011-04-28 NOTE — Progress Notes (Signed)
Patient ID: Julie Sanford Xxx-John, female   DOB: 1980/05/05, 31 y.o.   MRN: [id number]  [facility] Group Notes:  (Counselor/Nursing/MHT/Case Management/Adjunct)  04/28/2011 1:15 PM  Type of Therapy:  Group Therapy, Dance/Movement Therapy   Participation Level:  Minimal  Participation Quality:  Drowsy  Affect:  Blunted  Cognitive:  Oriented  Insight:  Limited  Engagement in Group:  Limited  Engagement in Therapy:  Limited  Modes of Intervention:  Clarification, Problem-solving, Role-play, Socialization and Support  Summary of Progress/Problems: Therapist discussed with group the meaning of healthy supports and what healthy support looks like to them.  Group processed what they would do if family and friends don't support them. Therapist ended group with the question, "If someone ruins your day, whose fault is it and why" for group to process on an individual basis the remainder of the day.   Rhunette Croft

## 2011-04-28 NOTE — Progress Notes (Signed)
[facility] MD Progress Note  04/28/2011 10:58 AM  Diagnosis:   Axis I: See current hospital problem list Axis II: Deferred Axis III:  Past Medical History  Diagnosis Date  . Depression    Axis IV: Unchanged Axis V: 41-50 serious symptoms  ADL's:  Intact  Sleep: Poor  Appetite:  Poor  Suicidal Ideation:  None Homicidal Ideation:  None  Subjective: Julie Sanford is complaining of severe muscle cramps in her legs that kept her awake through the night.  She denies any SI/HI or AVH, but endorses irritabilty and agitation.    AEB (as evidenced by):  Mental Status Examination/Evaluation: Objective:  Appearance: Disheveled  Eye Contact::  Minimal  Speech:  Clear and Coherent  Volume:  Normal  Mood:  Anxious, Depressed and Irritable  Affect:  Constricted  Thought Process:  Circumstantial  Orientation:  Full  Thought Content:  Obsessions  Suicidal Thoughts:  No  Homicidal Thoughts:  No  Memory:  Remote;   Good  Judgement:  Poor  Insight:  Lacking  Psychomotor Activity:  Shuffling Gait  Concentration:  Good  Recall:  Good  Akathisia:  No  Handed:    AIMS (if indicated):     Assets:  Resilience  Sleep:  Number of Hours: 0.75    Vital Signs:Blood pressure 122/76, pulse 97, temperature 97.4 F (36.3 C), temperature source Oral, resp. rate 24, height 5' 0.63" (1.54 m), weight 45.558 kg (100 lb 7 oz), last menstrual period 04/15/2011. Current Medications: Current Facility-Administered Medications  Medication Dose Route Frequency Provider Last Rate Last Dose  . alum & mag hydroxide-simeth (MAALOX/MYLANTA) 200-200-20 MG/5ML suspension 30 mL  30 mL Oral Q4H PRN Sanjuana Kava, NP      . chlordiazePOXIDE (LIBRIUM) capsule 25 mg  25 mg Oral BH-qamhs Verne Spurr, [place]   25 mg at 04/27/11 2135   Followed by  . chlordiazePOXIDE (LIBRIUM) capsule 25 mg  25 mg Oral Daily Verne Spurr, [place]      . cloNIDine (CATAPRES) tablet 0.1 mg  0.1 mg Oral QAC breakfast Verne Spurr, PA      .  dicyclomine (BENTYL) tablet 20 mg  20 mg Oral Q4H PRN Verne Spurr, PA   20 mg at 04/28/11 0214  . diphenhydrAMINE (BENADRYL) capsule 25 mg  25 mg Oral Q6H PRN Wonda Cerise, MD   25 mg at 04/27/11 2341  . diphenhydrAMINE (BENADRYL) capsule 50 mg  50 mg Oral NOW Wonda Cerise, MD   50 mg at 04/28/11 0230  . ibuprofen (ADVIL,MOTRIN) tablet 600 mg  600 mg Oral TID PRN Wonda Cerise, MD   600 mg at 04/28/11 0447  . loperamide (IMODIUM) capsule 2-4 mg  2-4 mg Oral PRN Verne Spurr, PA      . magnesium hydroxide (MILK OF MAGNESIA) suspension 30 mL  30 mL Oral Daily PRN Sanjuana Kava, NP      . methocarbamol (ROBAXIN) tablet 1,000 mg  1,000 mg Oral Q6H PRN Jorje Guild, PA   1,000 mg at 04/27/11 2341  . mirtazapine (REMERON) tablet 45 mg  45 mg Oral QHS Jorje Guild, PA   45 mg at 04/27/11 2136  . mulitivitamin with minerals tablet 1 tablet  1 tablet Oral Daily Verne Spurr, PA   1 tablet at 04/27/11 0800  . nicotine (NICODERM CQ - dosed in mg/24 hours) patch 21 mg  21 mg Transdermal Q0600 Alyson Kuroski-Mazzei, DO   21 mg at 04/27/11 0629  . OLANZapine zydis (ZYPREXA) disintegrating tablet 5 mg  5  mg Oral Q8H PRN Alyson Kuroski-Mazzei, DO   5 mg at 04/28/11 0221  . ondansetron (ZOFRAN-ODT) disintegrating tablet 4 mg  4 mg Oral Q6H PRN Verne Spurr, PA   4 mg at 04/23/11 1645  . QUEtiapine (SEROQUEL) tablet 100 mg  100 mg Oral TID Verne Spurr, PA   100 mg at 04/27/11 1807  . QUEtiapine (SEROQUEL) tablet 50 mg  50 mg Oral NOW Wonda Cerise, MD   50 mg at 04/28/11 0431  . rOPINIRole (REQUIP XL) 24 hr tablet 8 mg  8 mg Oral QHS Jorje Guild, PA   8 mg at 04/27/11 2137  . rOPINIRole (REQUIP) tablet 1 mg  1 mg Oral TID Jorje Guild, PA   1 mg at 04/27/11 1807  . thiamine (VITAMIN B-1) tablet 100 mg  100 mg Oral Daily Verne Spurr, PA   100 mg at 04/27/11 4540    Lab Results:  Results for orders placed during the hospital encounter of 04/23/11 (from the past 48 hour(s))  T3     Status: Normal   Collection Time   04/26/11   4:22 PM      Component Value Range Comment   T3, Total 101.0  80.0 - 204.0 (ng/dl)   T4, FREE     Status: Normal   Collection Time   04/26/11  4:22 PM      Component Value Range Comment   Free T4 1.26  0.80 - 1.80 (ng/dL)   TSH     Status: Abnormal   Collection Time   04/26/11  4:22 PM      Component Value Range Comment   TSH 0.123 (*) 0.350 - 4.500 (uIU/mL)   CBC     Status: Abnormal   Collection Time   04/26/11  4:22 PM      Component Value Range Comment   WBC 4.3  4.0 - 10.5 (K/uL)    RBC 4.49  3.87 - 5.11 (MIL/uL)    Hemoglobin 9.4 (*) 12.0 - 15.0 (g/dL)    HCT 98.1 (*) 19.1 - 46.0 (%)    MCV 67.5 (*) 78.0 - 100.0 (fL)    MCH 20.9 (*) 26.0 - 34.0 (pg)    MCHC 31.0  30.0 - 36.0 (g/dL)    RDW 47.8 (*) 29.5 - 15.5 (%)    Platelets 182  150 - 400 (K/uL)   DIFFERENTIAL     Status: Normal   Collection Time   04/26/11  4:22 PM      Component Value Range Comment   Neutrophils Relative 48  43 - 77 (%)    Lymphocytes Relative 43  12 - 46 (%)    Monocytes Relative 6  3 - 12 (%)    Eosinophils Relative 2  0 - 5 (%)    Basophils Relative 1  0 - 1 (%)    Neutro Abs 2.0  1.7 - 7.7 (K/uL)    Lymphs Abs 1.9  0.7 - 4.0 (K/uL)    Monocytes Absolute 0.3  0.1 - 1.0 (K/uL)    Eosinophils Absolute 0.1  0.0 - 0.7 (K/uL)    Basophils Absolute 0.0  0.0 - 0.1 (K/uL)    RBC Morphology ROULEAUX       Physical Findings: AIMS:  , ,  ,  ,    CIWA:  CIWA-Ar Total: 3  COWS:  COWS Total Score: 19   Treatment Plan Summary: Daily contact with patient to assess and evaluate symptoms and progress in treatment Medication management  Plan:  We will change her night-time Requip to short-acting.  Try her on Zyprexa.  Use Benadryl for sleep.    Mubarak Bevens 04/28/2011, 10:58 AM

## 2011-04-29 DIAGNOSIS — R7989 Other specified abnormal findings of blood chemistry: Secondary | ICD-10-CM | POA: Clinically undetermined

## 2011-04-29 MED ORDER — NICOTINE POLACRILEX 2 MG MT GUM
2.0000 mg | CHEWING_GUM | OROMUCOSAL | Status: DC
  Administered 2011-04-29 – 2011-04-30 (×6): 2 mg via ORAL
  Filled 2011-04-29 (×15): qty 1

## 2011-04-29 MED ORDER — QUETIAPINE FUMARATE 200 MG PO TABS
200.0000 mg | ORAL_TABLET | Freq: Three times a day (TID) | ORAL | Status: DC
  Administered 2011-04-29 – 2011-04-30 (×2): 200 mg via ORAL
  Filled 2011-04-29 (×3): qty 1
  Filled 2011-04-29 (×2): qty 42
  Filled 2011-04-29: qty 1
  Filled 2011-04-29: qty 42
  Filled 2011-04-29: qty 1

## 2011-04-29 MED ORDER — NICOTINE POLACRILEX 2 MG MT GUM
2.0000 mg | CHEWING_GUM | OROMUCOSAL | Status: DC | PRN
  Administered 2011-04-30: 2 mg via ORAL

## 2011-04-29 NOTE — Progress Notes (Signed)
[facility] Group Notes:  (Counselor/Nursing/MHT/Case Management/Adjunct)  04/29/2011 3:46 PM  Type of Therapy:  Group Therapy  Participation Level:  Minimal  Participation Quality:  Asleep, Drowsy and Sharing  Cognitive:  Oriented  Insight:  Limited  Engagement in Group:  Limited  Engagement in Therapy:  None  Modes of Intervention:  Limit-setting  Summary of Progress/Problems:  Patient shared that she feels being here is like being in jail, to which many other patients provided a reality check.  Once patient's complaints ended she went to sleep; was awoken by facilitator and asked to return to room.    Clide Dales 04/29/2011,    [facility] Group Notes:  (Counselor/Nursing/MHT/Case Management/Adjunct)  04/29/2011   Type of Therapy:  Group Therapy at 1:15  Participation Level: Minimal  Participation Quality:  Drowsy and Inattentive  Affect:  Flat and Irritable  Cognitive:  Oriented  Insight:  Limited  Engagement in Group:  None  Engagement in Therapy:  None  Modes of Intervention:  Limit-setting and Socialization  Summary of Progress/Problems:  Patient was asked to leave group at beginning in order to resolve issue with unit staff verses complaining in group about it. Patient returned and seemed irritated that no one wanted to know details and appeared irritated for remainder of group.    Clide Dales 04/29/2011, 3:52 PM

## 2011-04-29 NOTE — Progress Notes (Signed)
Pt has been requesting medications all day today.  She kept insisting that the doctor planned to put her back on the daytime dose of requip but no order has been seen thus far.  Pt will have an increase in her seroquel to start tonight at 2000 and she voiced understanding.  She is wanting to go to [facility] if a bed become available.  She was caught smoking after lunch today and the team decided to not allow her any visitors today.  Informed pt and she voiced understanding.  This is the second time that she has been caught smoking during this admission.  Verne Spurr PA was to write the order but did make note in her charting.  Showed this to [employer]. RN [facility] and he agreed that was enough information to restrict visitors.  Pt has complained today about the room searches.  Another pt was caught early this morning which led to a search of the unit then and then after she was caught the unit was searched again.  Pt has had many prn meds see mar, daily cares and pain assessments.  She rated her depression and hopelessness both a 5 and denied any anxiety however, she has been ranting and raving off and on today over her medications especially her requip.  She finally seemed satisfied once she realized she was going to get the bedtime dose tonight.  On her self-inventory she denied any S/H ideations or A/V hallucinations either.

## 2011-04-29 NOTE — Progress Notes (Signed)
[facility] MD Progress Note  04/29/2011 2:10 PM  Diagnosis:  Opiate addiction  ADL's:  Intact  Sleep: Fair  Appetite:  Good  Suicidal Ideation:  none Homicidal Ideation:  none AEB (as evidenced by): ROS:  Constitutional: ENT:  Neg for runny nose, cough, or congestion ENDO:  CARD: neg for chest pain PUL:  Neg for cough or SOB GI:  Neg for N/V/D MUSK: much improved regarding the restless leg with addition of Requip NEURO:  DERM: PSYCH: Neg SI/HI,AH/VH Subjective: Julie Sanford states that she slept well due to the addition of Requip for her restless legs.  She is requesting to have it during the day as well. Julie Sanford is anticipating going to [facility] for further rehab upon an available bed.  Today she is asking to be discharged home if there is no bed available for her at rehab.   Mental Status Examination/Evaluation: Objective:  Appearance: Disheveled  Eye Contact::  Good  Speech:  Clear and Coherent  Volume:  Normal  Mood:  Anxious  Affect:  Appropriate  Thought Process:  Linear  Orientation:  Full  Thought Content:  WDL  Suicidal Thoughts:  No  Homicidal Thoughts:  No  Memory:  Immediate;   Fair  Judgement:  Poor  Insight:  Lacking  Psychomotor Activity:  Normal  Concentration:  Good  Recall:  Fair  Akathisia:  No  Handed:    AIMS (if indicated):     Assets:  Desire for Improvement  Sleep:  Number of Hours: 0.75    Vital Signs:Blood pressure 135/82, pulse 105, temperature 97.2 F (36.2 C), temperature source Oral, resp. rate 16, height 5' 0.63" (1.54 m), weight 45.558 kg (100 lb 7 oz), last menstrual period 04/15/2011. Objective: Julie Sanford immediately corners the provider the second I arrive on the hall to ask what her plans for discharge are.  Her speech is clear and goal directed, she notes no SI/HI, denies AH/VH.  She met with the treatment team this morning and again stated that she desired residential rehab upon discharge.  She is in full contact with reality and her  insight is limited.  She demonstrates poor judgement by smoking on the hall and was distressed to learn that she could be discharged for this violation of policy. Current Medications: Current Facility-Administered Medications  Medication Dose Route Frequency Provider Last Rate Last Dose  . alum & mag hydroxide-simeth (MAALOX/MYLANTA) 200-200-20 MG/5ML suspension 30 mL  30 mL Oral Q4H PRN Sanjuana Kava, NP      . cloNIDine (CATAPRES) tablet 0.1 mg  0.1 mg Oral QAC breakfast Verne Spurr, PA   0.1 mg at 04/29/11 0706  . dicyclomine (BENTYL) tablet 20 mg  20 mg Oral Q4H PRN Verne Spurr, PA   20 mg at 04/28/11 0214  . diphenhydrAMINE (BENADRYL) capsule 25 mg  25 mg Oral Q6H PRN Wonda Cerise, MD   25 mg at 04/28/11 2046  . diphenhydrAMINE (BENADRYL) capsule 50 mg  50 mg Oral QHS Jorje Guild, PA   50 mg at 04/28/11 2311  . ibuprofen (ADVIL,MOTRIN) tablet 600 mg  600 mg Oral TID PRN Wonda Cerise, MD   600 mg at 04/29/11 0703  . magnesium hydroxide (MILK OF MAGNESIA) suspension 30 mL  30 mL Oral Daily PRN Sanjuana Kava, NP      . methocarbamol (ROBAXIN) tablet 1,000 mg  1,000 mg Oral Q6H PRN Jorje Guild, PA   1,000 mg at 04/29/11 0704  . mirtazapine (REMERON) tablet 45 mg  45 mg Oral  QHS Jorje Guild, PA   45 mg at 04/28/11 2313  . mulitivitamin with minerals tablet 1 tablet  1 tablet Oral Daily Verne Spurr, [place]   1 tablet at 04/29/11 [phone]  . nicotine polacrilex (NICORETTE) gum 2 mg  2 mg Oral Q4H while awake Alyson Kuroski-Mazzei, DO   2 mg at 04/29/11 1001   And  . nicotine polacrilex (NICORETTE) gum 2 mg  2 mg Oral Q2H PRN Alyson Kuroski-Mazzei, DO      . OLANZapine (ZYPREXA) tablet 10 mg  10 mg Oral QHS Jorje Guild, PA   10 mg at 04/28/11 2312  . OLANZapine zydis (ZYPREXA) disintegrating tablet 5 mg  5 mg Oral Q8H PRN Alyson Kuroski-Mazzei, DO   5 mg at 04/29/11 0807  . ondansetron (ZOFRAN-ODT) disintegrating tablet 4 mg  4 mg Oral Q6H PRN Verne Spurr, PA   4 mg at 04/23/11 1645  . QUEtiapine (SEROQUEL)  tablet 100 mg  100 mg Oral TID Verne Spurr, PA   100 mg at 04/29/11 1152  . rOPINIRole (REQUIP) tablet 1 mg  1 mg Oral TID Jorje Guild, PA   1 mg at 04/28/11 1714  . rOPINIRole (REQUIP) tablet 2 mg  2 mg Oral QHS Jorje Guild, PA   2 mg at 04/28/11 2311  . thiamine (VITAMIN B-1) tablet 100 mg  100 mg Oral Daily Verne Spurr, PA   100 mg at 04/29/11 0807  . DISCONTD: nicotine (NICODERM CQ - dosed in mg/24 hours) patch 21 mg  21 mg Transdermal Q0600 Alyson Kuroski-Mazzei, DO   21 mg at 04/29/11 0708  Lab Results: CBC showed Rouleaux on CBC diff.  CIWA:  CIWA-Ar Total: 6  COWS:  COWS Total Score: 8   Treatment Plan Summary: Julie Sanford will be on "no visitors" today due to her smoking infraction, she will stay over today with hopes of getting into [facility].  She is encouraged to consider other options if no [facility] bed is available.  Plan:  Medications will be optimized today to reduce the zyprexa to discontinue it.  Her seroquel will be increased to maximize the benefit.  Robie Mcniel 04/29/2011, 2:10 PM

## 2011-04-29 NOTE — Progress Notes (Signed)
Patient ID: Julie Sanford, female   DOB: May 25, 1980, 31 y.o.   MRN: [id number] Having a better evening, is brighter and less anxious,  Starting to be more goal focused, and verbalizing that she needs long term, recognizes that she still feels too weak to resist the temptation once she leaves here.  Has been cooperative, pleasant, talked about some of her life experiences that led her to drugs, wants to get back on track for her children, feels a lot of support from her mother, feels motivated tonight.  Attended group, played cards for awhile, compliant with her meds, not as med seeking and not having as much discomfort.  Stayed up late and had to be reminded to be considerate of her roommate, was redirectable and receptive, went to bed and is currently sleeping.  Will continue to monitor.

## 2011-04-30 MED ORDER — QUETIAPINE FUMARATE 200 MG PO TABS: 200.0000 mg | ORAL_TABLET | Freq: Three times a day (TID) | ORAL | Status: DC

## 2011-04-30 MED ORDER — MIRTAZAPINE 45 MG PO TABS: 45.0000 mg | ORAL_TABLET | Freq: Every day | ORAL | Status: DC

## 2011-04-30 MED ORDER — ROPINIROLE HCL 2 MG PO TABS: 2.0000 mg | ORAL_TABLET | Freq: Every day | ORAL | Status: DC

## 2011-04-30 NOTE — Progress Notes (Signed)
Patient ID: Julie Sanford Xxx-Mcdougle, female   DOB: 1980-04-16, 31 y.o.   MRN: [id number] Pt was anxious and had the writer go thru her scheduled and prn meds.  Informed the writer that she was supposed to get zyprexa, as well as another med. Informed the pt that seroquel was increased. Support and encouragement was offered.

## 2011-04-30 NOTE — Progress Notes (Signed)
Pt was discharged home today. She denied any S/I H/I or A/V hallucinations.  She was given f/u appointment, rx, sample medications, and hotline info booklet. She voiced understanding to all instructions provided.  She declined the need for smoking cessation materials.  
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
## 2011-04-30 NOTE — Progress Notes (Signed)
Recreation Therapy Group Note  Date: 04/30/2011        Time: 1000       Group Topic/Focus: Patient invited to participate in animal assisted therapy. Pets as a coping skill and responsibility were discussed.   Participation Level: Minimal  Participation Quality: Distracted  Affect: Labile  Cognitive: Oriented  Additional Comments: Patient in and out of the group room, focused on her pending discharge and the fact that one of her female peers was being discharged.

## 2011-04-30 NOTE — Treatment Plan (Signed)
Interdisciplinary Treatment Plan Update (Adult)  Date: 04/30/2011  Time Reviewed: 11:48 AM   Progress in Treatment: Attending groups: Yes Participating in groups: Yes Taking medication as prescribed: Yes Tolerating medication: Yes   Family/Significant othe contact made:   Patient understands diagnosis:  Yes Discussing patient identified problems/goals with staff:  Yes Medical problems stabilized or resolved:  Yes Denies suicidal/homicidal ideation: Yes On self inventory and in tx team Issues/concerns per patient self-inventory:  Yes  Depression and hopelessness are 5's, no anxiety Other:  New problem(s) identified: N/A  Reason for Continuation of Hospitalization: Other; describe D/C today  Interventions implemented related to continuation of hospitalization:   Additional comments:  Estimated length of stay:D/C today  Discharge Plan:Return home, follow up [facility], [facility]  Alpa says she will call [facility] daily to find out about a bed New goal(s): N/A  Review of initial/current patient goals per problem list:   1.  Goal(s):Detox  Met:  Yes  Target date:3/17  As evidenced by:No withdrawal symptoms  2.  Goal (s):Reduce depression  Met:  Yes  Target date:3/19  As evidenced by: Decreased, but did not meet stated goal of 3 or less  3.  Goal(s):Identify comprehensive sobriety plan  Met:  Yes  Target date:3/19  As evidenced by:see above d/c plan  4.  Goal(s):  Met:  Yes  Target date:  As evidenced by:  Attendees: Patient:  Julie Sanford 04/30/2011 11:48 AM  Family:     Physician:  Lupe Carney 04/30/2011 11:48 AM   Nursing:  Lupita Leash shimp  04/30/2011 11:48 AM   Case Manager:  Richelle Ito, LCSW 04/30/2011 11:48 AM   Counselor:  Ronda Fairly, LCSWA 04/30/2011 11:48 AM   Other:  Verne Spurr 04/30/2011 11:48 AM  Other:     Other:     Other:      Scribe for Treatment Team:   Ida Rogue, 04/30/2011 11:48 AM
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
## 2011-04-30 NOTE — Progress Notes (Signed)
[facility] Case Management Discharge Plan:  Will you be returning to the same living situation after discharge: Yes,  home At discharge, do you have transportation home?:Yes,  family Do you have the ability to pay for your medications:Yes,  mental health  Interagency Information:     Release of information consent forms completed and in the chart;  Patient's signature needed at discharge.  Patient to Follow up at:  Follow-up Information    Follow up with [facility]. (call  [phone] at 9:00 daily to find out about beds)       Follow up with [facility] on 05/01/2011. (9:00  Call to reschedule if this does not work for you)    Contact information:   [address]  [place]  [phone]      Follow up with [facility]. (States that you are not an open patient there)       Follow up with [facility]. (Walk in [place] or Wed at 1:00, Fri at 8 for assessment for services  They have an intensive outpt program for substance abuse)    Contact information:   [address]  [336] [phone]         Patient denies SI/HI:   Yes,  yes    Aeronautical engineer and Suicide Prevention discussed:  Yes,  yes  Barrier to discharge identified:No.  Summary and Recommendations:   Julie Sanford 04/30/2011, 12:00 PM
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
## 2011-05-03 NOTE — Progress Notes (Signed)
Patient Discharge Instructions:  Psychiatric Admission Assessment Note Provided,  05/03/2011 After Visit Summary (AVS) Provided,  05/03/2011 Face Sheet Provided, 05/03/2011 Faxed/Sent to the Next Level Care provider:  05/03/2011 Sent Suicide Risk Assessment - Discharge Assessment 05/03/2011  [facility]axed to [facility] @ [phone] And to [facility] @ [phone]  Wandra Scot, 05/03/2011, 6:06 PM

## 2011-05-08 ENCOUNTER — Encounter (HOSPITAL_COMMUNITY): Payer: Self-pay | Admitting: Emergency Medicine

## 2011-05-08 ENCOUNTER — Emergency Department (HOSPITAL_COMMUNITY)
Admission: EM | Admit: 2011-05-08 | Discharge: 2011-05-08 | Disposition: A | Discharge status: Home / Self Care | Payer: Self-pay | Attending: Emergency Medicine | Admitting: Emergency Medicine

## 2011-05-08 DIAGNOSIS — F1110 Opioid abuse, uncomplicated: Secondary | ICD-10-CM | POA: Insufficient documentation

## 2011-05-08 HISTORY — DX: Bipolar disorder, unspecified: F31.9

## 2011-05-08 HISTORY — DX: Herpesviral infection, unspecified: B00.9

## 2011-05-08 NOTE — ED Notes (Signed)
Called pt. to take to exam room but pt. did not answer. 

## 2011-05-08 NOTE — ED Notes (Signed)
Called pt. to take to exam room but pt. did not answer.

## 2011-05-08 NOTE — ED Notes (Signed)
[facility] unable to locate pt in waiting area.

## 2011-05-08 NOTE — ED Notes (Signed)
Pt requesting detox heroin states last use last night at 03:00 states usual uses is 2 bundles daily or 20 bags

## 2011-05-14 ENCOUNTER — Encounter (HOSPITAL_COMMUNITY): Payer: Self-pay | Admitting: *Deleted

## 2011-05-14 ENCOUNTER — Emergency Department (HOSPITAL_COMMUNITY)
Admission: EM | Admit: 2011-05-14 | Discharge: 2011-05-14 | Disposition: A | Discharge status: Home / Self Care | Payer: Self-pay | Attending: Emergency Medicine | Admitting: Emergency Medicine

## 2011-05-14 DIAGNOSIS — F17200 Nicotine dependence, unspecified, uncomplicated: Secondary | ICD-10-CM | POA: Insufficient documentation

## 2011-05-14 DIAGNOSIS — F319 Bipolar disorder, unspecified: Secondary | ICD-10-CM | POA: Insufficient documentation

## 2011-05-14 DIAGNOSIS — K029 Dental caries, unspecified: Secondary | ICD-10-CM | POA: Insufficient documentation

## 2011-05-14 MED ORDER — OXYCODONE-ACETAMINOPHEN 5-325 MG PO TABS: 1.0000 | ORAL_TABLET | Freq: Four times a day (QID) | ORAL | Status: AC | PRN

## 2011-05-14 MED ORDER — HYDROMORPHONE HCL PF 2 MG/ML IJ SOLN
1.0000 mg | Freq: Once | INTRAMUSCULAR | Status: AC
  Administered 2011-05-14: 1 mg via INTRAMUSCULAR
  Filled 2011-05-14: qty 1

## 2011-05-14 MED ORDER — CLINDAMYCIN HCL 150 MG PO CAPS: 150.0000 mg | ORAL_CAPSULE | Freq: Four times a day (QID) | ORAL | Status: AC

## 2011-05-14 NOTE — ED Notes (Signed)
Pt with several day hx of right upper jaw discomfort from tooth that has broken. Pt reports unable to go to dentist. Pt with noted swelling to face.

## 2011-05-14 NOTE — ED Notes (Signed)
Pt tearful states jaw still hurts even after brigette injected it

## 2011-05-14 NOTE — ED Provider Notes (Signed)
History     CSN: [id number]  Arrival date & time 05/14/11  1304   First MD Initiated Contact with Patient 05/14/11 1317     2:06 PM HPI Patient reports severe right upper dentition pain. States pain began 1 week ago. Reports she took some old antibiotics that has not helped the pain. Reports severe facial swelling. Does not have a dentist to followup with. Denies drainage, fever, neck pain, ear pain, sore throat, difficulty swallowing.   Patient is a 31 y.o. female presenting with tooth pain. The history is provided by the patient.  Dental PainPrimary symptoms do not include fever, shortness of breath or sore throat. The symptoms began 5 to 7 days ago. The symptoms are worsening. The symptoms are new. The symptoms occur constantly.  Additional symptoms include: dental sensitivity to temperature, gum swelling, gum tenderness, jaw pain, facial swelling and ear pain. Additional symptoms do not include: purulent gums, trismus, trouble swallowing, pain with swallowing, excessive salivation and swollen glands.    Past Medical History  Diagnosis Date  . Depression   . Bipolar 1 disorder   . Herpes     Past Surgical History  Procedure Date  . Cesarean section     History reviewed. No pertinent family history.  History  Substance Use Topics  . Smoking status: Current Everyday Smoker    Types: Cigarettes  . Smokeless tobacco: Not on file  . Alcohol Use: No    OB History    Grav Para Term Preterm Abortions TAB SAB Ect Mult Living                  Review of Systems  Constitutional: Negative for fever and chills.  HENT: Positive for ear pain, facial swelling and dental problem. Negative for sore throat, trouble swallowing and voice change.   Respiratory: Negative for shortness of breath.   All other systems reviewed and are negative.    Allergies  Tramadol  Home Medications   Current Outpatient Rx  Name Route Sig Dispense Refill  . IBUPROFEN 200 MG PO TABS Oral Take 800  mg by mouth every 2 (two) hours as needed. For pain    . MIRTAZAPINE 45 MG PO TABS Oral Take 45 mg by mouth at bedtime.    Marland Kitchen QUETIAPINE FUMARATE 200 MG PO TABS Oral Take 200 mg by mouth 3 (three) times daily. For bipolar disorder.    Marland Kitchen ROPINIROLE HCL 2 MG PO TABS Oral Take 2 mg by mouth at bedtime.      BP 137/68  Pulse 90  Temp 97.9 F (36.6 C)  Resp 16  SpO2 97%  LMP 04/15/2011  Physical Exam  Vitals reviewed. Constitutional: She is oriented to person, place, and time. Vital signs are normal. She appears well-developed and well-nourished. No distress.  HENT:  Nose: Nose normal.  Mouth/Throat: Uvula is midline, oropharynx is clear and moist and mucous membranes are normal. Dental caries present.         Patient has moderate right facial swelling. Oropharynx is clear. no neck tenderness. Full range of motion of neck.  Eyes: Pupils are equal, round, and reactive to light.  Neck: Normal range of motion. Neck supple. No spinous process tenderness and no muscular tenderness present. Normal range of motion present.  Pulmonary/Chest: Effort normal.  Neurological: She is alert and oriented to person, place, and time.  Skin: Skin is warm and dry. No rash noted. No erythema. No pallor.  Psychiatric: She has a normal mood and affect.  Her behavior is normal.    ED Course  Dental Performed by: Thomasene Lot Authorized by: Thomasene Lot Consent given by: patient Patient identity confirmed: verbally with patient Time out: Immediately prior to procedure a "time out" was called to verify the correct patient, procedure, equipment, support staff and site/side marked as required. Local anesthesia used: yes Anesthesia: nerve block Local anesthetic: bupivacaine 0.5% without epinephrine Anesthetic total: 4 ml Patient sedated: no Comments: Right infralveolar nerve. Minimal relief     MDM   Recommended patient call Dr. Ninetta Lights immediately after d/c since patient is in such severe pain  . Pt agrees and is ready for d/c      Thomasene Lot, PA-C 05/15/11 2004

## 2011-05-14 NOTE — Discharge Instructions (Signed)
Dental Caries  Tooth decay (dental caries, cavities) is the most common of all oral diseases. It occurs in all ages but is more common in children and young adults.  CAUSES  Bacteria in your mouth combine with foods (particularly sugars and starches) to produce plaque. Plaque is a substance that sticks to the hard surfaces of teeth. The bacteria in the plaque produce acids that attack the enamel of teeth. Repeated acid attacks dissolve the enamel and create holes in the teeth. Root surfaces of teeth may also get these holes.  Other contributing factors include:   Frequent snacking and drinking of cavity-producing foods and liquids.   Poor oral hygiene.   Dry mouth.   Substance abuse such as methamphetamine.   Broken or poor fitting dental restorations.   Eating disorders.   Gastroesophageal reflux disease (GERD).   Certain radiation treatments to the head and neck.  SYMPTOMS  At first, dental decay appears as white, chalky areas on the enamel. In this early stage, symptoms are seldom present. As the decay progresses, pits and holes may appear on the enamel surfaces. Progression of the decay will lead to softening of the hard layers of the tooth. At this point you may experience some pain or achy feeling after sweet, hot, or cold foods or drinks are consumed. If left untreated, the decay will reach the internal structures of the tooth and produce severe pain. Extensive dental treatment, such as root canal therapy, may be needed to save the tooth at this late stage of decay development.  DIAGNOSIS  Most cavities will be detected during regular check-ups. A thorough medical and dental history will be taken by the dentist. The dentist will use instruments to check the surfaces of your teeth for any breakdown or discoloration. Some dentists have special instruments, such as lasers, that detect tooth decay. Dental X-rays may also show some cavities that are not visible to the eye (such as between  the contact areas of the teeth). TREATMENT  Treatment involves removal of the tooth decay and replacement with a restorative material such as silver, gold, or composite (white) material. However, if the decay involves a large area of the tooth and there is little remaining healthy tooth structure, a cap (crown) will be fitted over the remaining structure. If the decay involves the center part of the tooth (pulp), root canal treatment will be needed before any type of dental restoration is placed. If the tooth is severely destroyed by the decay process, leaving the remaining tooth structures unrestorable, the tooth will need to be pulled (extracted). Some early tooth decay may be reversed by fluoride treatments and thorough brushing and flossing at home. PREVENTION   Eat healthy foods. Restrict the amount of sugary, starchy foods and liquids you consume. Avoid frequent snacking and drinking of unhealthy foods and liquids.   Sealants can help with prevention of cavities. Sealants are composite resins applied onto the biting surfaces of teeth at risk for decay. They smooth out the pits and grooves and prevent food from being trapped in them. This is done in early childhood before tooth decay has started.   Fluoride tablets may also be prescribed to children between 6 months and 10 years of age if your drinking water is not fluoridated. The fluoride absorbed by the tooth enamel makes teeth less susceptible to decay. Thorough daily cleaning with a toothbrush and dental floss is the best way to prevent cavities. Use of a fluoride toothpaste is highly recommended. Fluoride mouth rinses   may be used in specific cases.   Topical application of fluoride by your dentist is important in children.   Regular visits with a dentist for checkups and cleanings are also important.  SEEK IMMEDIATE DENTAL CARE IF:  You have a fever.   You develop redness and swelling of your face, jaw, or neck.   You develop swelling  around a tooth.   You are unable to open your mouth or cannot swallow.   You have severe pain uncontrolled by pain medicine.  Document Released: 10/20/2001 Document Revised: 01/17/2011 Document Reviewed: 07/05/2010 [facility] Patient Information 2012 [place], [place].Dental Caries  Tooth decay (dental caries, cavities) is the most common of all oral diseases. It occurs in all ages but is more common in children and young adults.  CAUSES  Bacteria in your mouth combine with foods (particularly sugars and starches) to produce plaque. Plaque is a substance that sticks to the hard surfaces of teeth. The bacteria in the plaque produce acids that attack the enamel of teeth. Repeated acid attacks dissolve the enamel and create holes in the teeth. Root surfaces of teeth may also get these holes.  Other contributing factors include:   Frequent snacking and drinking of cavity-producing foods and liquids.   Poor oral hygiene.   Dry mouth.   Substance abuse such as methamphetamine.   Broken or poor fitting dental restorations.   Eating disorders.   Gastroesophageal reflux disease (GERD).   Certain radiation treatments to the head and neck.  SYMPTOMS  At first, dental decay appears as white, chalky areas on the enamel. In this early stage, symptoms are seldom present. As the decay progresses, pits and holes may appear on the enamel surfaces. Progression of the decay will lead to softening of the hard layers of the tooth. At this point you may experience some pain or achy feeling after sweet, hot, or cold foods or drinks are consumed. If left untreated, the decay will reach the internal structures of the tooth and produce severe pain. Extensive dental treatment, such as root canal therapy, may be needed to save the tooth at this late stage of decay development.  DIAGNOSIS  Most cavities will be detected during regular check-ups. A thorough medical and dental history will be taken by the dentist. The  dentist will use instruments to check the surfaces of your teeth for any breakdown or discoloration. Some dentists have special instruments, such as lasers, that detect tooth decay. Dental X-rays may also show some cavities that are not visible to the eye (such as between the contact areas of the teeth). TREATMENT  Treatment involves removal of the tooth decay and replacement with a restorative material such as silver, gold, or composite (white) material. However, if the decay involves a large area of the tooth and there is little remaining healthy tooth structure, a cap (crown) will be fitted over the remaining structure. If the decay involves the center part of the tooth (pulp), root canal treatment will be needed before any type of dental restoration is placed. If the tooth is severely destroyed by the decay process, leaving the remaining tooth structures unrestorable, the tooth will need to be pulled (extracted). Some early tooth decay may be reversed by fluoride treatments and thorough brushing and flossing at home. PREVENTION   Eat healthy foods. Restrict the amount of sugary, starchy foods and liquids you consume. Avoid frequent snacking and drinking of unhealthy foods and liquids.   Sealants can help with prevention of cavities. Sealants are composite  resins applied onto the biting surfaces of teeth at risk for decay. They smooth out the pits and grooves and prevent food from being trapped in them. This is done in early childhood before tooth decay has started.   Fluoride tablets may also be prescribed to children between 6 months and 54 years of age if your drinking water is not fluoridated. The fluoride absorbed by the tooth enamel makes teeth less susceptible to decay. Thorough daily cleaning with a toothbrush and dental floss is the best way to prevent cavities. Use of a fluoride toothpaste is highly recommended. Fluoride mouth rinses may be used in specific cases.   Topical application of  fluoride by your dentist is important in children.   Regular visits with a dentist for checkups and cleanings are also important.  SEEK IMMEDIATE DENTAL CARE IF:  You have a fever.   You develop redness and swelling of your face, jaw, or neck.   You develop swelling around a tooth.   You are unable to open your mouth or cannot swallow.   You have severe pain uncontrolled by pain medicine.  Document Released: 10/20/2001 Document Revised: 01/17/2011 Document Reviewed: 07/05/2010 [facility] Patient Information 2012 [place], [place].

## 2011-05-14 NOTE — ED Notes (Signed)
Rt side of face and jaw swellingsince last week took some pcn that she had and it went down now it has swollen more  Making ear hurt  And she taken motrin but it has not helped

## 2011-05-14 NOTE — ED Notes (Signed)
C/o toothache x 1 week. Presents with rt sided zygomatic swelling. States awakening with searing pain this am. Denies trouble breathing.

## 2011-05-16 NOTE — ED Provider Notes (Signed)
Medical screening examination/treatment/procedure(s) were performed by non-physician practitioner and as supervising physician I was immediately available for consultation/collaboration.   Nat Christen, MD 05/16/11 1538

## 2011-05-27 ENCOUNTER — Emergency Department (HOSPITAL_COMMUNITY): Payer: Self-pay

## 2011-05-27 ENCOUNTER — Emergency Department (HOSPITAL_COMMUNITY)
Admission: EM | Admit: 2011-05-27 | Discharge: 2011-05-28 | Disposition: A | Discharge status: Home / Self Care | Payer: Self-pay | Attending: Emergency Medicine | Admitting: Emergency Medicine

## 2011-05-27 ENCOUNTER — Emergency Department (HOSPITAL_COMMUNITY)
Admission: EM | Admit: 2011-05-27 | Discharge: 2011-05-27 | Discharge status: Against Medical Advice | Payer: Self-pay | Attending: Emergency Medicine | Admitting: Emergency Medicine

## 2011-05-27 ENCOUNTER — Encounter (HOSPITAL_COMMUNITY): Payer: Self-pay | Admitting: Emergency Medicine

## 2011-05-27 DIAGNOSIS — F17200 Nicotine dependence, unspecified, uncomplicated: Secondary | ICD-10-CM | POA: Insufficient documentation

## 2011-05-27 DIAGNOSIS — F1910 Other psychoactive substance abuse, uncomplicated: Secondary | ICD-10-CM

## 2011-05-27 DIAGNOSIS — F1110 Opioid abuse, uncomplicated: Secondary | ICD-10-CM | POA: Insufficient documentation

## 2011-05-27 DIAGNOSIS — F319 Bipolar disorder, unspecified: Secondary | ICD-10-CM | POA: Insufficient documentation

## 2011-05-27 DIAGNOSIS — R569 Unspecified convulsions: Secondary | ICD-10-CM | POA: Insufficient documentation

## 2011-05-27 DIAGNOSIS — R110 Nausea: Secondary | ICD-10-CM | POA: Insufficient documentation

## 2011-05-27 HISTORY — DX: Unspecified convulsions: R56.9

## 2011-05-27 LAB — CBC
HCT: 32.9 % — ABNORMAL LOW (ref 36.0–46.0)
Hemoglobin: 10 g/dL — ABNORMAL LOW (ref 12.0–15.0)
MCHC: 30.4 g/dL (ref 30.0–36.0)
MCV: 67.7 fL — ABNORMAL LOW (ref 78.0–100.0)
RDW: 18.2 % — ABNORMAL HIGH (ref 11.5–15.5)

## 2011-05-27 LAB — RAPID URINE DRUG SCREEN, HOSP PERFORMED
Amphetamines: NOT DETECTED
Barbiturates: NOT DETECTED
Benzodiazepines: NOT DETECTED

## 2011-05-27 LAB — BASIC METABOLIC PANEL
BUN: 7 mg/dL (ref 6–23)
Chloride: 100 mEq/L (ref 96–112)
Creatinine, Ser: 0.86 mg/dL (ref 0.50–1.10)
GFR calc non Af Amer: >90 mL/min (ref >90)
Glucose, Bld: 79 mg/dL (ref 70–99)
Potassium: 3.8 mEq/L (ref 3.5–5.1)

## 2011-05-27 LAB — URINALYSIS, ROUTINE W REFLEX MICROSCOPIC
Bilirubin Urine: NEGATIVE
Hgb urine dipstick: NEGATIVE
Nitrite: NEGATIVE
Protein, ur: NEGATIVE mg/dL
Urobilinogen, UA: 1 mg/dL (ref 0.0–1.0)

## 2011-05-27 LAB — HEPATIC FUNCTION PANEL
Bilirubin, Direct: <0.1 mg/dL (ref 0.0–0.3)
Total Bilirubin: 0.3 mg/dL (ref 0.3–1.2)

## 2011-05-27 LAB — ETHANOL: Alcohol, Ethyl (B): <11 mg/dL (ref 0–11)

## 2011-05-27 MED ORDER — LOPERAMIDE HCL 2 MG PO CAPS: 2.0000 mg | ORAL_CAPSULE | ORAL | Status: DC | PRN

## 2011-05-27 MED ORDER — SODIUM CHLORIDE 0.9 % IV BOLUS (SEPSIS)
1000.0000 mL | Freq: Once | INTRAVENOUS | Status: AC
  Administered 2011-05-27: 1000 mL via INTRAVENOUS

## 2011-05-27 MED ORDER — MIRTAZAPINE 30 MG PO TABS: 45.0000 mg | ORAL_TABLET | Freq: Every day | ORAL | Status: DC

## 2011-05-27 MED ORDER — CLONIDINE HCL 0.1 MG PO TABS: 0.1000 mg | ORAL_TABLET | Freq: Every day | ORAL | Status: DC

## 2011-05-27 MED ORDER — IBUPROFEN 200 MG PO TABS
600.0000 mg | ORAL_TABLET | Freq: Four times a day (QID) | ORAL | Status: DC | PRN
  Administered 2011-05-28: 600 mg via ORAL
  Filled 2011-05-27: qty 3

## 2011-05-27 MED ORDER — DICYCLOMINE HCL 20 MG PO TABS: 20.0000 mg | ORAL_TABLET | ORAL | Status: DC | PRN

## 2011-05-27 MED ORDER — ROPINIROLE HCL 1 MG PO TABS
2.0000 mg | ORAL_TABLET | Freq: Every day | ORAL | Status: DC
  Administered 2011-05-28: 2 mg via ORAL
  Filled 2011-05-27 (×2): qty 2

## 2011-05-27 MED ORDER — HYDROXYZINE HCL 25 MG PO TABS
25.0000 mg | ORAL_TABLET | Freq: Four times a day (QID) | ORAL | Status: DC | PRN
  Administered 2011-05-28 (×2): 25 mg via ORAL
  Filled 2011-05-27 (×2): qty 1

## 2011-05-27 MED ORDER — CLONIDINE HCL 0.1 MG PO TABS
0.1000 mg | ORAL_TABLET | Freq: Four times a day (QID) | ORAL | Status: DC
  Administered 2011-05-27 – 2011-05-28 (×2): 0.1 mg via ORAL
  Filled 2011-05-27 (×2): qty 1

## 2011-05-27 MED ORDER — LORAZEPAM 2 MG/ML IJ SOLN
2.0000 mg | Freq: Once | INTRAMUSCULAR | Status: AC
  Administered 2011-05-27: 2 mg via INTRAVENOUS
  Filled 2011-05-27: qty 1

## 2011-05-27 MED ORDER — NAPROXEN 500 MG PO TABS
500.0000 mg | ORAL_TABLET | Freq: Two times a day (BID) | ORAL | Status: DC | PRN
  Administered 2011-05-28 (×2): 500 mg via ORAL
  Filled 2011-05-27: qty 1

## 2011-05-27 MED ORDER — CLONIDINE HCL 0.1 MG PO TABS: 0.1000 mg | ORAL_TABLET | ORAL | Status: DC

## 2011-05-27 MED ORDER — ONDANSETRON 4 MG PO TBDP: 4.0000 mg | ORAL_TABLET | Freq: Four times a day (QID) | ORAL | Status: DC | PRN

## 2011-05-27 MED ORDER — QUETIAPINE FUMARATE 100 MG PO TABS
200.0000 mg | ORAL_TABLET | Freq: Three times a day (TID) | ORAL | Status: DC
  Administered 2011-05-28 (×2): 200 mg via ORAL
  Filled 2011-05-27 (×2): qty 2

## 2011-05-27 MED ORDER — LORAZEPAM 2 MG/ML IJ SOLN
1.0000 mg | Freq: Once | INTRAMUSCULAR | Status: AC
  Administered 2011-05-27: 1 mg via INTRAVENOUS
  Filled 2011-05-27: qty 1

## 2011-05-27 MED ORDER — METHOCARBAMOL 500 MG PO TABS
500.0000 mg | ORAL_TABLET | Freq: Three times a day (TID) | ORAL | Status: DC | PRN
  Administered 2011-05-28 (×2): 500 mg via ORAL
  Filled 2011-05-27 (×2): qty 1

## 2011-05-27 NOTE — ED Notes (Signed)
NT ambulated pt to bathroom

## 2011-05-27 NOTE — ED Provider Notes (Addendum)
History     CSN: [id number]  Arrival date & time 05/27/11  2035   First MD Initiated Contact with Patient 05/27/11 2054      Chief Complaint  Patient presents with  . Medical Clearance    (Consider location/radiation/quality/duration/timing/severity/associated sxs/prior treatment) The history is provided by the patient.   patient states she's recently and feels short of breath. She has not had withdrawal at this before. She states she uses about 2 bottles of heroin a day. She states yesterday she used 4 bags and today she used 2 bags. She states she doesn't think she could be withdrawn. She's had some nauseousness. She wonders if that heroin been placed. She got from one of her normal suppliers. She's not suicidal or homicidal. She states that she wants detox of heroin 2. She injects on her arms. She also has mentioned benzos and cocaine use in the past, but states she has not used it recently Past Medical History  Diagnosis Date  . Depression   . Bipolar 1 disorder   . Herpes   . Seizures     Past Surgical History  Procedure Date  . Cesarean section     History reviewed. No pertinent family history.  History  Substance Use Topics  . Smoking status: Current Everyday Smoker    Types: Cigarettes  . Smokeless tobacco: Not on file  . Alcohol Use: No    OB History    Grav Para Term Preterm Abortions TAB SAB Ect Mult Living                  Review of Systems  Constitutional: Positive for chills, appetite change and fatigue. Negative for fever.  Gastrointestinal: Positive for nausea. Negative for vomiting, abdominal pain and diarrhea.  Genitourinary: Negative for pelvic pain.  Musculoskeletal: Positive for myalgias.  Neurological: Negative for numbness.  Psychiatric/Behavioral: Negative for behavioral problems.    Allergies  Tramadol  Home Medications   Current Outpatient Rx  Name Route Sig Dispense Refill  . IBUPROFEN 200 MG PO TABS Oral Take 800 mg by mouth  every 2 (two) hours as needed. For pain    . MIRTAZAPINE 45 MG PO TABS Oral Take 45 mg by mouth at bedtime.    Marland Kitchen QUETIAPINE FUMARATE 200 MG PO TABS Oral Take 200 mg by mouth 3 (three) times daily. For bipolar disorder.    Marland Kitchen ROPINIROLE HCL 2 MG PO TABS Oral Take 2 mg by mouth at bedtime.      BP 150/93  Pulse 115  Temp(Src) 98.6 F (37 C) (Oral)  Resp 22  SpO2 100%  LMP 05/12/2011  Physical Exam  Nursing note and vitals reviewed. Constitutional: She is oriented to person, place, and time. She appears well-developed and well-nourished.       Patient appears anxious  HENT:  Head: Normocephalic and atraumatic.  Eyes: EOM are normal. Pupils are equal, round, and reactive to light.  Neck: Normal range of motion. Neck supple.  Cardiovascular: Regular rhythm and normal heart sounds.   No murmur heard.      Tachycardia  Pulmonary/Chest: Effort normal and breath sounds normal. No respiratory distress. She has no wheezes. She has no rales.  Abdominal: Soft. Bowel sounds are normal. She exhibits no distension. There is no tenderness. There is no rebound and no guarding.  Musculoskeletal: Normal range of motion.  Neurological: She is alert and oriented to person, place, and time. No cranial nerve deficit.  Skin: Skin is warm and dry.  Injection sites along veins on bilateral forearms. No erythema. No fluctuance.  Psychiatric: She has a normal mood and affect. Her speech is normal.    ED Course  Procedures (including critical care time)  Labs Reviewed  CBC - Abnormal; Notable for the following:    WBC 3.6 (*)    Hemoglobin 10.0 (*)    HCT 32.9 (*)    MCV 67.7 (*)    MCH 20.6 (*)    RDW 18.2 (*)    All other components within normal limits  URINE RAPID DRUG SCREEN (HOSP PERFORMED) - Abnormal; Notable for the following:    Opiates POSITIVE (*)    Cocaine POSITIVE (*)    All other components within normal limits  BASIC METABOLIC PANEL  ETHANOL  URINALYSIS, ROUTINE W REFLEX  MICROSCOPIC  HEPATIC FUNCTION PANEL  PREGNANCY, URINE  CULTURE, BLOOD (ROUTINE X 2)  CULTURE, BLOOD (ROUTINE X 2)   Dg Chest 2 View  05/27/2011  *RADIOLOGY REPORT*  Clinical Data: Bodyaches, chills, shortness of breath.  CHEST - 2 VIEW  Comparison: Chest radiograph 03/07/2009  Findings: Heart, mediastinal, and hilar contours are normal.  The lungs are clear.  There is no pleural effusion or pneumothorax. The bones are unremarkable.  IMPRESSION: Normal chest radiograph.  Original Report Authenticated By: Britta Mccreedy, M.D.     No diagnosis found.    MDM   patient states that she is freezing her muscles ache. She states she has not felt like this in her withdrawal in the past. She normally uses 20 bags of heroin a day and is 4 bags yesterday and today. She's worried been placed. Drug screen does show cocaine. Lab work is otherwise reassuring. She has a slightly low white count of 3.6.     she is tachycardic. She's been given some Ativan and will be given clonidine. She'll likely need rehabilitation and detox, but she is not medically cleared yet.     Juliet Rude. Rubin Payor, MD 05/28/11 0007   Recheck at 3:11 AM, improving some with heart rate now in the 90s after clonidine, Ativan and fluids. Patient requesting detox. ACT team consult in for evaluation in the emergency department. Holding orders in place. Serial evaluations, vital signs improved. Psych dispo pending, patient is voluntary without indication for IVC at this time. Patient is medically cleared.  Sunnie Nielsen, MD 05/28/11 [phone]

## 2011-05-27 NOTE — ED Notes (Signed)
Pt called in all 3 waiting ares- no answer X 1

## 2011-05-27 NOTE — ED Notes (Signed)
Pt notified that we need a urine sample.  Pt st's she will let staff know when she needs to go.

## 2011-05-27 NOTE — ED Notes (Signed)
Pt states she is detoxing off heroin  Pt states she is freezing and feels short of breath  Pt states she has never felt like this before  Pt states last used today  Pt is very anxious in words and actions

## 2011-05-28 LAB — PREGNANCY, URINE: Preg Test, Ur: NEGATIVE

## 2011-05-28 MED ORDER — KETOROLAC TROMETHAMINE 30 MG/ML IJ SOLN
INTRAMUSCULAR | Status: AC
  Administered 2011-05-28: 30 mg
  Filled 2011-05-28: qty 1

## 2011-05-28 MED ORDER — SODIUM CHLORIDE 0.9 % IV SOLN
INTRAVENOUS | Status: DC
  Administered 2011-05-28: 04:00:00 via INTRAVENOUS

## 2011-05-28 MED ORDER — LORAZEPAM 2 MG/ML IJ SOLN
1.0000 mg | Freq: Four times a day (QID) | INTRAMUSCULAR | Status: DC | PRN
  Administered 2011-05-28: 1 mg via INTRAVENOUS
  Filled 2011-05-28: qty 1

## 2011-05-28 MED ORDER — LORAZEPAM 1 MG PO TABS
2.0000 mg | ORAL_TABLET | Freq: Once | ORAL | Status: AC
  Administered 2011-05-28: 2 mg via ORAL
  Filled 2011-05-28: qty 2

## 2011-05-28 MED ORDER — KETOROLAC TROMETHAMINE 60 MG/2ML IM SOLN: 60.0000 mg | Freq: Once | INTRAMUSCULAR | Status: DC

## 2011-05-28 NOTE — Discharge Instructions (Signed)
If you continue to use heroin you will kill yourself. Please seek help when you are ready.  Drug Abuse, Frequently Asked Questions Drug addiction is a complex brain disease. It is characterized by compulsive, at times uncontrollable, drug craving, seeking, and use that persists even in the face of extremely negative results. Drug seeking becomes compulsive, in large part as a result of the effects of prolonged drug use on brain functioning and, thus, on behavior. For many people, drug addiction becomes chronic, with relapses possible even after long periods of being off the drug. HOW QUICKLY CAN I BECOME ADDICTED TO A DRUG? There is no easy answer to this. If and how quickly you might become addicted to a drug depends on many factors including the biology of your body. All drugs are potentially harmful and may have life-threatening consequences associated with their use. There are also vast differences among individuals in sensitivity to various drugs. While one person may use a drug many times and suffer no ill effects, another person may be particularly vulnerable and overdose or developing a craving with the first use. There is no way of knowing in advance how someone may react. HOW DO I KNOW IF SOMEONE IS ADDICTED TO DRUGS? If a person is compulsively seeking and using a drug despite negative consequences (such as loss of job, debt, physical problems brought on by drug abuse, or family problems) then he or she is probably addicted. Those who screen for drug problems, such as physicians, have developed the CAGE questionnaire. These four simple questions can help detect substance abuse problems:  Have you ever felt you ought to Cut down on your drinking/drug use?   Have people ever Annoyed you by criticizing your drinking/drug use?   Have you ever felt bad or Guilty about your drinking/drug use?   Have you ever had a drink or taken a drug first thing in the morning to steady your nerves or get rid  of a hangover (Eye-opener)?  WHAT ARE THE PHYSICAL SIGNS OF ABUSE OR ADDICTION? The physical signs of abuse or addiction can vary depending on the person and the drug being abused. For example, someone who abuses marijuana may have a chronic cough or worsening of asthmatic conditions. THC, the chemical in marijuana responsible for producing its effects, is associated with weakening the immune system which makes the user more vulnerable to infections, such as pneumonia. Each drug has short-term and long-term physical effects. Stimulants like cocaine increase heart rate and blood pressure, whereas opioids like heroin may slow the heart rate and reduce breathing (respiration).  ARE THERE EFFECTIVE TREATMENTS FOR DRUG ADDICTION? Drug addiction can be effectively treated with behavioral-based therapies and, for addiction to some drugs such as heroin or nicotine, medications may be used. Treatment may vary for each person depending on the type of drug(s) being used and multiple courses of treatment may be needed to achieve success. Research has revealed 13 basic principles that underlie effective drug addiction treatment. These are discussed in NIDA's Principles of Drug Addiction Treatment: A Research-Based Guide. WHERE CAN I FIND INFORMATION ABOUT DRUG TREATMENT PROGRAMS?  For referrals to treatment programs, visit the Substance Abuse and Mental Health Services Administration online at http://findtreatment.http://gonzalez-rivas.net/.   NIDA publishes an expanding series of treatment manuals, the "clinical toolbox," that gives drug treatment providers research-based information for creating effective treatment programs.  WHAT IS DETOXIFICATION, OR "DETOX"? Detoxification is the process of allowing the body to rid itself of a drug while managing the symptoms of  withdrawal. It is often the first step in a drug treatment program and should be followed by treatment with a behavioral-based therapy and/or a medication, if  available. Detox alone with no follow-up is not treatment.  WHAT IS WITHDRAWAL? HOW LONG DOES IT LAST? Withdrawal is the variety of symptoms that occur after use of some addictive drugs is reduced or stopped. Length of withdrawal and symptoms vary with the type of drug. For example, physical symptoms of heroin withdrawal may include restlessness, muscle and bone pain, insomnia, diarrhea, vomiting, and cold flashes. These physical symptoms may last for several days, but the general depression, or dysphoria (opposite of euphoria), that often accompanies heroin withdrawal, may last for weeks. In many cases withdrawal can be easily treated with medications to ease the symptoms. But treating withdrawal is not the same as treating addiction.  WHAT ARE THE COSTS OF DRUG ABUSE TO SOCIETY? Beyond the raw numbers are other costs to society:  Spread of infectious diseases such as HIV/AIDS and hepatitis C either through sharing of drug paraphernalia or unprotected sex.   Deaths due to overdose or other complications from drug use.   Effects on unborn children of pregnant drug users.   Other effects such as crime and homelessness.  IF A PREGNANT WOMAN ABUSES DRUGS, DOES IT AFFECT THE FETUS?  Many substances including alcohol, nicotine, and drugs of abuse can have negative effects on the developing fetus because they are transferred to the fetus across the placenta. For example, nicotine has been connected with premature birth and low birth weight, as has the use of cocaine. Scientific studies have shown that babies born to marijuana users were shorter, weighed less, and had smaller head sizes than those born to mothers who did not use the drug. Smaller babies are more likely to develop health problems.   Whether a baby's health problems, if caused by a drug, will continue as the child grows, is not always known. Research does show that children born to mothers who used marijuana regularly during pregnancy may  have trouble concentrating, when older. Our research continues to produce insights on the negative effects of drug use on the fetus.  Document Released: 01/31/2003 Document Revised: 01/17/2011 Document Reviewed: 04/29/2008 [facility] Patient Information 2012 [place], [place].Chemical Dependency Chemical dependency is an addiction to drugs or alcohol. It is characterized by the repeated behavior of seeking out and using drugs and alcohol despite harmful consequences to the health and safety of ones self and others.  RISK FACTORS There are certain situations or behaviors that increase a person's risk for chemical dependency. These include:  A family history of chemical dependency.   A history of mental health issues, including depression and anxiety.   A home environment where drugs and alcohol are easily available to you.   Drug or alcohol use at a young age.  SYMPTOMS  The following symptoms can indicate chemical dependency:  Inability to limit the use of drugs or alcohol.   Nausea, sweating, shakiness, and anxiety that occurs when alcohol or drugs are not being used.   An increase in amount of drugs or alcohol that is necessary to get drunk or high.  People who experience these symptoms can assess their use of drugs and alcohol by asking themselves the following questions:  Have you been told by friends or family that they are worried about your use of alcohol or drugs?   Do friends and family ever tell you about things you did while drinking alcohol or using  drugs that you do not remember?   Do you lie about using alcohol or drugs or about the amounts you use?   Do you have difficulty completing daily tasks unless you use alcohol or drugs?   Is the level of your work or school performance lower because of your drug or alcohol use?   Do you get sick from using drugs or alcohol but keep using anyway?   Do you feel uncomfortable in social situations unless you use alcohol or drugs?     Do you use drugs or alcohol to help forget problems?  An answer of yes to any of these questions may indicate chemical dependency. Professional evaluation is suggested. Document Released: 01/22/2001 Document Revised: 01/17/2011 Document Reviewed: 04/05/2010 [facility] Patient Information 2012 [place], [place].  RESOURCE GUIDE  Dental Problems  Patients with Medicaid: [facility] [id number]                                           [id number] Phone:  [phone]                                                  Phone:  [phone]  If unable to pay or uninsured, contact:  [facility] or [facility]. to become qualified for the adult dental clinic.  Chronic Pain Problems Contact [facility]  [phone] Patients need to be referred by their primary care doctor.  Insufficient Money for Medicine Contact [employer] Way:  call "211" or [facility] [phone].  No Primary Care Doctor Call [facility]  [phone] Other agencies that provide inexpensive medical care    [facility]  [phone]    [facility]  [phone]    [facility]  [phone]    [facility]  [phone]    [facility]  [phone]    [facility]  [phone]  Psychological Services [facility] Behavior[facility]  [phone] [facility] Services  [phone] [facility]   [phone] (emergency services [phone])  Substance Abuse Resources [facility] Drug Services  [phone] [facility] [phone] [facility] [place] [phone] Floydene Flock [phone] Residential & Outpatient Substance Abuse Program  [phone]  Abuse/Neglect [facility] Child Abuse Hotline (250)441-4491 [facility] Child Abuse Hotline [phone] (After Hours)  Emergency Shelter [facility] 3377480106  Maternity Homes Room at  the [place] [facility] [place] [phone] [facility] Services [phone]  MRSA Hotline #:   351-627-3002    [facility] Resources  [facility] [place]     [employer]                          [facility] Dept. [address] [place]                       [address]      [address]  [address]  Sela Hua Phone:  [phone]                                   Phone:  [phone]                 Phone:  [place] Phone:  [place] [phone] (After Hours)

## 2011-05-28 NOTE — BH Assessment (Signed)
Assessment Note   Julie Sanford is an 31 y.o. female.  Julie Sanford came in seeking detox from her clonopin and heroin use.  She said that she is using 2 bundles/D of heroin for the last few weeks.  She also has daily use of clonopin but does not know how much.  She does report using yesterday but does not recall how much.  [name] reports no HI, SI or A/V hallucinations.  This patient was difficult to assess since she was extremely restless.  She complained of leg cramps and she jumped, hopped and skipped about the room.  She said that she has restless legs. Julie Sanford could not give a coherent answer since she was squealing about pain.  Nurse gave her pain medication before interview was started and there was no noticeable effect on her motor activity.  She was still restless and unable to focus.  She did say that she did not want detox at [facility] and said that she had been referred to [facility]" [facility] by [facility].  She did not know anything more than that.  This clinician recommends seeing if patient is more coherent after pain medication is given the opportunity to work. Axis I: Substance Abuse Axis II: Deferred Axis III:  Past Medical History  Diagnosis Date  . Depression   . Bipolar 1 disorder   . Herpes   . Seizures    Axis IV: occupational problems, problems related to social environment and problems with primary support group Axis V: 31-40 impairment in reality testing  Past Medical History:  Past Medical History  Diagnosis Date  . Depression   . Bipolar 1 disorder   . Herpes   . Seizures     Past Surgical History  Procedure Date  . Cesarean section     Family History: History reviewed. No pertinent family history.  Social History:  reports that she has been smoking Cigarettes.  She does not have any smokeless tobacco history on file. She reports that she uses illicit drugs (Cocaine and Heroin). She reports that she does not drink alcohol.  Additional Social  History:  Alcohol / Drug Use Pain Medications: Patient has been abusing clonopin and heroin Prescriptions: None Over the Counter: None History of alcohol / drug use?: Yes Longest period of sobriety (when/how long): Unknown Withdrawal Symptoms: Agitation;Irritability;Cramps;Tremors Substance #1 Name of Substance 1: Heroin 1 - Age of First Use: 31 years old 1 - Amount (size/oz): 2 bundles  1 - Frequency: Daily 1 - Duration: On-going 1 - Last Use / Amount: Yesterday Substance #2 Name of Substance 2: Reports using clonipin 2 - Age of First Use: 20's  2 - Amount (size/oz): Pt cannot recall her amount used of clonipin 2 - Frequency: Daily use 2 - Duration: On-going 2 - Last Use / Amount: 04/15 cannot recall amount used. Substance #3 Name of Substance 3: Cocaine 3 - Age of First Use: 31 years of age 5 - Amount (size/oz): $20 at a time 3 - Frequency: Will use when she cannot afford the heroin. 3 - Duration: On-going 3 - Last Use / Amount: Patient cannot recall date or amount Allergies:  Allergies  Allergen Reactions  . Tramadol Other (See Comments)    Muscle spasms and difficulty breathing    Home Medications:  Medications Prior to Admission  Medication Dose Route Frequency Provider Last Rate Last Dose  . 0.9 %  sodium chloride infusion   Intravenous Continuous Sunnie Nielsen, MD 125 mL/hr at 05/28/11 0345    .  cloNIDine (CATAPRES) tablet 0.1 mg  0.1 mg Oral QID Juliet Rude. Pickering, MD   0.1 mg at 05/27/11 2334   Followed by  . cloNIDine (CATAPRES) tablet 0.1 mg  0.1 mg Oral BH-qamhs Nathan R. Pickering, MD       Followed by  . cloNIDine (CATAPRES) tablet 0.1 mg  0.1 mg Oral QAC breakfast Juliet Rude. Pickering, MD      . dicyclomine (BENTYL) tablet 20 mg  20 mg Oral Q4H PRN Juliet Rude. Pickering, MD      . hydrOXYzine (ATARAX/VISTARIL) tablet 25 mg  25 mg Oral Q6H PRN Juliet Rude. Pickering, MD   25 mg at 05/28/11 1610  . ibuprofen (ADVIL,MOTRIN) tablet 600 mg  600 mg Oral Q6H PRN Juliet Rude. Pickering, MD   600 mg at 05/28/11 0029  . ketorolac (TORADOL) 30 MG/ML injection        30 mg at 05/28/11 0542  . ketorolac (TORADOL) injection 60 mg  60 mg Intramuscular Once Sunnie Nielsen, MD      . loperamide (IMODIUM) capsule 2-4 mg  2-4 mg Oral PRN Juliet Rude. Pickering, MD      . LORazepam (ATIVAN) injection 1 mg  1 mg Intravenous Once Nathan R. Pickering, MD   1 mg at 05/27/11 2244  . LORazepam (ATIVAN) injection 2 mg  2 mg Intravenous Once [employer]. Pickering, MD   2 mg at 05/27/11 2259  . methocarbamol (ROBAXIN) tablet 500 mg  500 mg Oral Q8H PRN Juliet Rude. Pickering, MD   500 mg at 05/28/11 0029  . mirtazapine (REMERON) tablet 45 mg  45 mg Oral QHS Nathan R. Pickering, MD      . naproxen (NAPROSYN) tablet 500 mg  500 mg Oral BID PRN Juliet Rude. Pickering, MD   500 mg at 05/28/11 0430  . ondansetron (ZOFRAN-ODT) disintegrating tablet 4 mg  4 mg Oral Q6H PRN Juliet Rude. Pickering, MD      . QUEtiapine (SEROQUEL) tablet 200 mg  200 mg Oral TID Juliet Rude. Pickering, MD   200 mg at 05/28/11 0029  . rOPINIRole (REQUIP) tablet 2 mg  2 mg Oral QHS Nathan R. Pickering, MD   2 mg at 05/28/11 0516  . sodium chloride 0.9 % bolus 1,000 mL  1,000 mL Intravenous Once Harrold Donath R. Pickering, MD   1,000 mL at 05/27/11 2300   Medications Prior to Admission  Medication Sig Dispense Refill  . ibuprofen (ADVIL,MOTRIN) 200 MG tablet Take 800 mg by mouth every 2 (two) hours as needed. For pain      . mirtazapine (REMERON) 45 MG tablet Take 45 mg by mouth at bedtime.      Marland Kitchen QUEtiapine (SEROQUEL) 200 MG tablet Take 200 mg by mouth 3 (three) times daily. For bipolar disorder.      Marland Kitchen rOPINIRole (REQUIP) 2 MG tablet Take 2 mg by mouth at bedtime.        OB/GYN Status:  Patient's last menstrual period was 05/12/2011.  General Assessment Data Location of Assessment: [facility] ED Living Arrangements: Other relatives Can pt return to current living arrangement?: Yes Admission Status: Voluntary Is patient capable of signing  voluntary admission?: Yes Transfer from: Acute Hospital Referral Source: Self/Family/Friend     Risk to self Suicidal Ideation: No Suicidal Intent: No Is patient at risk for suicide?: No Suicidal Plan?: No Access to Means: No What has been your use of drugs/alcohol within the last 12 months?: Abusing heroin, clonopin, cocaine Previous Attempts/Gestures: No How many times?:  0  Other Self Harm Risks: None Triggers for Past Attempts: None known Intentional Self Injurious Behavior: None Family Suicide History: No Recent stressful life event(s): Turmoil (Comment) (Drug use) Persecutory voices/beliefs?: No Depression: Yes Depression Symptoms: Feeling angry/irritable Substance abuse history and/or treatment for substance abuse?: Yes Suicide prevention information given to non-admitted patients: Not applicable  Risk to Others Homicidal Ideation: No Thoughts of Harm to Others: No Current Homicidal Intent: No Current Homicidal Plan: No Access to Homicidal Means: No Identified Victim: No one History of harm to others?: No Assessment of Violence: None Noted Violent Behavior Description: None Does patient have access to weapons?: No Criminal Charges Pending?: No Does patient have a court date: No  Psychosis Hallucinations: None noted Delusions: None noted  Mental Status Report Appear/Hygiene: Disheveled;Poor hygiene Eye Contact: Poor Motor Activity: Hyperactivity;Agitation;Restlessness;Unsteady Speech: Incoherent Level of Consciousness: Alert Mood: Anxious;Irritable Affect: Irritable Anxiety Level: Severe Thought Processes: Circumstantial Judgement: Impaired Orientation: Person;Place;Time;Situation Obsessive Compulsive Thoughts/Behaviors: Moderate  Cognitive Functioning Concentration: Decreased Memory: Recent Impaired;Remote Intact IQ: Average Insight: Poor Impulse Control: Poor Appetite: Poor Weight Loss: 0  Weight Gain: 0  Sleep: Decreased Total Hours of  Sleep:  (<6H/D) Vegetative Symptoms: None  Prior Inpatient Therapy Prior Inpatient Therapy: Yes Prior Therapy Dates: 2012 Prior Therapy Facilty/Provider(s): [facility]  Reason for Treatment: Rehab   Prior Outpatient Therapy Prior Therapy Dates: None Prior Therapy Facilty/Provider(s): N/A Reason for Treatment: SA  ADL Screening (condition at time of admission) Patient's cognitive ability adequate to safely complete daily activities?: Yes Patient able to express need for assistance with ADLs?: Yes Independently performs ADLs?: Yes Weakness of Legs: None Weakness of Arms/Hands: None  Home Assistive Devices/Equipment Home Assistive Devices/Equipment: None    Abuse/Neglect Assessment (Assessment to be complete while patient is alone) Physical Abuse: Denies Verbal Abuse: Denies Sexual Abuse: Denies Exploitation of patient/patient's resources: Denies Self-Neglect: Denies Values / Beliefs Cultural Requests During Hospitalization: None Spiritual Requests During Hospitalization: None   Advance Directives (For Healthcare) Advance Directive: Patient does not have advance directive;Patient would not like information    Additional Information 1:1 In Past 12 Months?: No CIRT Risk: No Elopement Risk: No Does patient have medical clearance?: Yes     Disposition:  Disposition Disposition of Patient: Referred to Patient referred to: Other (Comment) (Pt said she had been [facility])  On Site Evaluation by:   Reviewed with Physician:     Alexandria Lodge 05/28/2011 6:44 AM

## 2011-05-28 NOTE — ED Notes (Addendum)
Pt has been hopping around in her room and outside, back and forth to the nurses station since 0415. She states that she is having spasms in her legs and pain (h/o restless legs). She stated that she had not taken medication for restless leg in 3 days. She was asked several times to return to her room. Pt was woke up to be given medications at 0029: Robaxin, Ibuprofen, and Seroquel. Prior to that she had been given Ativan 3 mg and Catapress , given by another Charity fundraiser. Administered Naproxyn, Requip,Toradol, and Vistaril.  IV dressing has been changed and reinforced several times and temporarily saline locked because she continues to stretch line.

## 2011-05-28 NOTE — ED Notes (Signed)
Patient is resting comfortably. 

## 2011-05-28 NOTE — ED Notes (Signed)
Awaiting medication arrival from pharmacy.

## 2011-05-28 NOTE — ED Notes (Signed)
Upon shift changed, patient still has some belongings at bedside. Patient still has earrings in ears. Patient in hospital gown. No documentation of belongings present at this time. RN notified.

## 2011-05-28 NOTE — ED Provider Notes (Signed)
Pt requesting to leave. Here voluntarily for heroin abuse. No SI/HI or psychosis. Resources provided.  Raeford Razor, MD 05/28/11 1300

## 2011-05-28 NOTE — ED Notes (Signed)
Pts boyfriend at nsg station states "she is sleeping I can't take her home." I went in to pts room and called her name and she woke up and jumped out of bed c/o restless leg and back pain. Pts boyfriend said "you woke her up you can take care of her. I'm going to the waking room you can bring her out there." Pt dressed herself and I walked with her to d/c desk and then to waiting area, pts boyfriend not out there. I walked with pt outside and she stated he is mad b/c I didn't go to rehab, pt states "he is probably in his truck I go find it." I offered to wait on pt but she stated she was fine.

## 2011-06-03 LAB — CULTURE, BLOOD (ROUTINE X 2)
Culture  Setup Time: 201304160128
Culture: NO GROWTH

## 2013-04-17 ENCOUNTER — Encounter (HOSPITAL_COMMUNITY): Payer: Self-pay | Admitting: Emergency Medicine

## 2013-04-17 ENCOUNTER — Emergency Department (HOSPITAL_COMMUNITY)
Admission: EM | Admit: 2013-04-17 | Discharge: 2013-04-17 | Disposition: A | Discharge status: Home / Self Care | Payer: Medicaid Other | Source: Home / Self Care | Attending: Emergency Medicine | Admitting: Emergency Medicine

## 2013-04-17 ENCOUNTER — Inpatient Hospital Stay (HOSPITAL_COMMUNITY): Admission: AD | Admit: 2013-04-17 | Payer: Medicaid Other | Source: Intra-hospital | Admitting: Psychiatry

## 2013-04-17 ENCOUNTER — Emergency Department (HOSPITAL_COMMUNITY)
Admission: EM | Admit: 2013-04-17 | Discharge: 2013-04-17 | Disposition: A | Discharge status: Home / Self Care | Payer: Medicaid Other | Attending: Emergency Medicine | Admitting: Emergency Medicine

## 2013-04-17 DIAGNOSIS — R112 Nausea with vomiting, unspecified: Secondary | ICD-10-CM

## 2013-04-17 DIAGNOSIS — K029 Dental caries, unspecified: Secondary | ICD-10-CM | POA: Insufficient documentation

## 2013-04-17 DIAGNOSIS — F17200 Nicotine dependence, unspecified, uncomplicated: Secondary | ICD-10-CM | POA: Insufficient documentation

## 2013-04-17 DIAGNOSIS — Z8659 Personal history of other mental and behavioral disorders: Secondary | ICD-10-CM | POA: Insufficient documentation

## 2013-04-17 DIAGNOSIS — F1120 Opioid dependence, uncomplicated: Secondary | ICD-10-CM

## 2013-04-17 DIAGNOSIS — Z8619 Personal history of other infectious and parasitic diseases: Secondary | ICD-10-CM | POA: Insufficient documentation

## 2013-04-17 DIAGNOSIS — D649 Anemia, unspecified: Secondary | ICD-10-CM

## 2013-04-17 DIAGNOSIS — K089 Disorder of teeth and supporting structures, unspecified: Secondary | ICD-10-CM

## 2013-04-17 DIAGNOSIS — Z8669 Personal history of other diseases of the nervous system and sense organs: Secondary | ICD-10-CM | POA: Insufficient documentation

## 2013-04-17 DIAGNOSIS — Z3202 Encounter for pregnancy test, result negative: Secondary | ICD-10-CM | POA: Insufficient documentation

## 2013-04-17 LAB — CBC WITH DIFFERENTIAL/PLATELET
BASOS PCT: 1 % (ref 0–1)
Basophils Absolute: 0 10*3/uL (ref 0.0–0.1)
Eosinophils Absolute: 0.1 10*3/uL (ref 0.0–0.7)
Eosinophils Relative: 2 % (ref 0–5)
HCT: 27.2 % — ABNORMAL LOW (ref 36.0–46.0)
HEMOGLOBIN: 8 g/dL — AB (ref 12.0–15.0)
Lymphocytes Relative: 43 % (ref 12–46)
Lymphs Abs: 2 10*3/uL (ref 0.7–4.0)
MCH: 18.1 pg — AB (ref 26.0–34.0)
MCHC: 29.4 g/dL — ABNORMAL LOW (ref 30.0–36.0)
MCV: 61.4 fL — ABNORMAL LOW (ref 78.0–100.0)
Monocytes Absolute: 0.3 10*3/uL (ref 0.1–1.0)
Monocytes Relative: 7 % (ref 3–12)
Neutro Abs: 2.3 10*3/uL (ref 1.7–7.7)
Neutrophils Relative %: 47 % (ref 43–77)
Platelets: 208 10*3/uL (ref 150–400)
RBC: 4.43 MIL/uL (ref 3.87–5.11)
RDW: 18.3 % — ABNORMAL HIGH (ref 11.5–15.5)
WBC: 4.7 10*3/uL (ref 4.0–10.5)

## 2013-04-17 LAB — COMPREHENSIVE METABOLIC PANEL
ALT: 7 U/L (ref 0–35)
AST: 14 U/L (ref 0–37)
Albumin: 4 g/dL (ref 3.5–5.2)
Alkaline Phosphatase: 61 U/L (ref 39–117)
BUN: 13 mg/dL (ref 6–23)
CO2: 23 mEq/L (ref 19–32)
CREATININE: 0.89 mg/dL (ref 0.50–1.10)
Calcium: 9.2 mg/dL (ref 8.4–10.5)
Chloride: 102 mEq/L (ref 96–112)
GFR calc Af Amer: >90 mL/min (ref >90)
GFR calc non Af Amer: 86 mL/min — ABNORMAL LOW (ref >90)
GLUCOSE: 82 mg/dL (ref 70–99)
Potassium: 3.6 mEq/L — ABNORMAL LOW (ref 3.7–5.3)
Sodium: 139 mEq/L (ref 137–147)
TOTAL PROTEIN: 7.7 g/dL (ref 6.0–8.3)
Total Bilirubin: <0.2 mg/dL — ABNORMAL LOW (ref 0.3–1.2)

## 2013-04-17 LAB — PREGNANCY, URINE: Preg Test, Ur: NEGATIVE

## 2013-04-17 LAB — RAPID URINE DRUG SCREEN, HOSP PERFORMED
AMPHETAMINES: NOT DETECTED
BARBITURATES: NOT DETECTED
BENZODIAZEPINES: NOT DETECTED
COCAINE: POSITIVE — AB
Opiates: POSITIVE — AB
Tetrahydrocannabinol: NOT DETECTED

## 2013-04-17 LAB — ETHANOL: Alcohol, Ethyl (B): <11 mg/dL (ref 0–11)

## 2013-04-17 MED ORDER — NAPROXEN 250 MG PO TABS
500.0000 mg | ORAL_TABLET | Freq: Two times a day (BID) | ORAL | Status: DC | PRN
  Administered 2013-04-17: 500 mg via ORAL
  Filled 2013-04-17: qty 2

## 2013-04-17 MED ORDER — FERROUS FUMARATE 325 (106 FE) MG PO TABS
1.0000 | ORAL_TABLET | Freq: Every day | ORAL | Status: DC
  Administered 2013-04-17: 106 mg via ORAL
  Filled 2013-04-17 (×2): qty 1

## 2013-04-17 MED ORDER — ONDANSETRON 4 MG PO TBDP: 4.0000 mg | ORAL_TABLET | Freq: Four times a day (QID) | ORAL | Status: DC | PRN

## 2013-04-17 MED ORDER — ACETAMINOPHEN 325 MG PO TABS
650.0000 mg | ORAL_TABLET | ORAL | Status: DC | PRN
  Administered 2013-04-17: 650 mg via ORAL
  Filled 2013-04-17: qty 2

## 2013-04-17 MED ORDER — CLONIDINE HCL 0.1 MG PO TABS: 0.1000 mg | ORAL_TABLET | Freq: Every day | ORAL | Status: DC

## 2013-04-17 MED ORDER — ALUM & MAG HYDROXIDE-SIMETH 200-200-20 MG/5ML PO SUSP: 30.0000 mL | ORAL | Status: DC | PRN

## 2013-04-17 MED ORDER — NICOTINE 21 MG/24HR TD PT24
21.0000 mg | MEDICATED_PATCH | Freq: Every day | TRANSDERMAL | Status: DC
  Administered 2013-04-17: 21 mg via TRANSDERMAL
  Filled 2013-04-17: qty 1

## 2013-04-17 MED ORDER — CLONIDINE HCL 0.1 MG PO TABS
0.1000 mg | ORAL_TABLET | Freq: Four times a day (QID) | ORAL | Status: DC
Start: 2013-04-17 — End: 2013-04-17
  Administered 2013-04-17: 0.1 mg via ORAL
  Filled 2013-04-17: qty 1

## 2013-04-17 MED ORDER — DIVALPROEX SODIUM 250 MG PO DR TAB: 500.0000 mg | DELAYED_RELEASE_TABLET | Freq: Every day | ORAL | Status: DC

## 2013-04-17 MED ORDER — HYDROXYZINE HCL 25 MG PO TABS
25.0000 mg | ORAL_TABLET | Freq: Four times a day (QID) | ORAL | Status: DC | PRN
  Administered 2013-04-17: 25 mg via ORAL
  Filled 2013-04-17: qty 1

## 2013-04-17 MED ORDER — CLONIDINE HCL 0.1 MG PO TABS: 0.1000 mg | ORAL_TABLET | ORAL | Status: DC

## 2013-04-17 MED ORDER — LEVOTHYROXINE SODIUM 50 MCG PO TABS
50.0000 ug | ORAL_TABLET | Freq: Every day | ORAL | Status: DC
  Administered 2013-04-17: 50 ug via ORAL
  Filled 2013-04-17 (×3): qty 1

## 2013-04-17 MED ORDER — AMOXICILLIN-POT CLAVULANATE 875-125 MG PO TABS: 1.0000 | ORAL_TABLET | Freq: Two times a day (BID) | ORAL | Status: DC

## 2013-04-17 MED ORDER — ACETAMINOPHEN-CODEINE 120-12 MG/5ML PO SOLN: 5.0000 mL | ORAL | Status: DC | PRN

## 2013-04-17 MED ORDER — ACETAMINOPHEN-CODEINE 120-12 MG/5ML PO SOLN
10.0000 mL | Freq: Once | ORAL | Status: AC
  Administered 2013-04-17: 10 mL via ORAL
  Filled 2013-04-17: qty 10

## 2013-04-17 MED ORDER — ONDANSETRON HCL 4 MG PO TABS: 4.0000 mg | ORAL_TABLET | Freq: Three times a day (TID) | ORAL | Status: DC | PRN

## 2013-04-17 MED ORDER — LOPERAMIDE HCL 2 MG PO CAPS: 2.0000 mg | ORAL_CAPSULE | ORAL | Status: DC | PRN

## 2013-04-17 MED ORDER — AMOXICILLIN-POT CLAVULANATE 875-125 MG PO TABS
1.0000 | ORAL_TABLET | Freq: Two times a day (BID) | ORAL | Status: DC
  Administered 2013-04-17: 1 via ORAL
  Filled 2013-04-17: qty 1

## 2013-04-17 MED ORDER — FLUOXETINE HCL 20 MG PO CAPS
20.0000 mg | ORAL_CAPSULE | Freq: Every day | ORAL | Status: DC
  Filled 2013-04-17: qty 1

## 2013-04-17 MED ORDER — ZOLPIDEM TARTRATE 5 MG PO TABS: 5.0000 mg | ORAL_TABLET | Freq: Every evening | ORAL | Status: DC | PRN

## 2013-04-17 MED ORDER — METHOCARBAMOL 500 MG PO TABS
500.0000 mg | ORAL_TABLET | Freq: Three times a day (TID) | ORAL | Status: DC | PRN
  Administered 2013-04-17: 500 mg via ORAL
  Filled 2013-04-17: qty 1

## 2013-04-17 MED ORDER — DICYCLOMINE HCL 20 MG PO TABS: 20.0000 mg | ORAL_TABLET | Freq: Four times a day (QID) | ORAL | Status: DC | PRN

## 2013-04-17 NOTE — ED Notes (Signed)
The pt was just seen here for a toothache checked out then checked back in

## 2013-04-17 NOTE — ED Notes (Signed)
Clarified the need for Augmentin.  MD discusses it is ordered for teeth.

## 2013-04-17 NOTE — ED Notes (Signed)
PATIENT REPORTS SHE DID DETOX ABOUT A YEAR AGO AT [facility] AND HAD ABOUT 6 MONTHS CLEAN. STATES SHE HAD A RELAPSE AND WANTS TO GET CLEAN AGAIN. SHE REPORTS SHE USES ABOUT 10 BAGS OF HEROIN A DAY. LAST USED Friday AROUND 10 AM (6 BAGS). SHE ALSO REPORT SHE TAKES ABOUT 4 MG XANAX DAILY. SHE REPORTS SHE HAS A SEIZURE DISORDER AND IS ON MEDS FOR THIS. DENIES SI OR HI AT THIS TIME.

## 2013-04-17 NOTE — ED Provider Notes (Signed)
CSN: [id number]     Arrival date & time 04/17/13  0158 History   First MD Initiated Contact with Patient 04/17/13 [phone]     Chief Complaint  Patient presents with  . detox      (Consider location/radiation/quality/duration/timing/severity/associated sxs/prior Treatment) HPI  33 year old female presents to emergency department with complaint of dental pain and opiate addiction.  Patient was seen in the emergency department earlier in the evening for dental pain.  She was given prescription for Augmentin, Tylenol with Codeine, and ibuprofen, prescription, as well as dental referral information.  Patient returns to the emergency department wishing detox from heroin.  Patient reports she planned to go to a dentist on Monday to have her teeth pulled, but now has decided that she needs to get her hair in use under control.  She reports her family is supportive in this decision, and does not wish to have her come home until she is clean.  Patient appears to have poor insight into her condition.  She reports that she will get her teeth addressed after she gets her addiction under control.  Patient has long history of heroin use.  She reports she is an IV drug user.  She denies any other drug or alcohol use.  Her urine is noted to have cocaine in it, which she is surprised about and denies taking voluntarily.  Other than teeth pain, she reports restless legs and cramping  She is noted to be anemic, reports history of same.  She denies any chest pain, shortness of breath, or fatigue.  Patient last used at 10 AM yesterday  Past Medical History  Diagnosis Date  . Depression   . Bipolar 1 disorder   . Herpes   . Seizures    Past Surgical History  Procedure Laterality Date  . Cesarean section     History reviewed. No pertinent family history. History  Substance Use Topics  . Smoking status: Current Every Day Smoker    Types: Cigarettes  . Smokeless tobacco: Not on file  . Alcohol Use: No   OB History    Grav Para Term Preterm Abortions TAB SAB Ect Mult Living                 Review of Systems  See History of Present Illness; otherwise all other systems are reviewed and negative   Allergies  Tramadol  Home Medications   Current Outpatient Rx  Name  Route  Sig  Dispense  Refill  . acetaminophen-codeine 120-12 MG/5ML solution   Oral   Take 5 mLs by mouth every 4 (four) hours as needed for moderate pain.   120 mL   0   . amoxicillin-clavulanate (AUGMENTIN) 875-125 MG per tablet   Oral   Take 1 tablet by mouth every 12 (twelve) hours.   14 tablet   0   . ibuprofen (ADVIL,MOTRIN) 200 MG tablet   Oral   Take 800 mg by mouth every 2 (two) hours as needed. For pain          BP 102/60  Pulse 71  Temp(Src) 98.4 F (36.9 C) (Oral)  Resp 18  Ht 4\' 11"  (1.499 m)  Wt 102 lb (46.267 kg)  BMI 20.59 kg/m2  SpO2 96% Physical Exam  Nursing note and vitals reviewed. Constitutional: She is oriented to person, place, and time. She appears well-developed and well-nourished. No distress.  HENT:  Head: Normocephalic and atraumatic.  Right Ear: External ear normal.  Left Ear: External ear normal.  Nose: Nose normal.  Mouth/Throat: Oropharynx is clear and moist.  Poor dentition  Eyes: Conjunctivae and EOM are normal. Pupils are equal, round, and reactive to light.  Neck: Normal range of motion. Neck supple. No JVD present. No tracheal deviation present. No thyromegaly present.  Cardiovascular: Normal rate, regular rhythm, normal heart sounds and intact distal pulses.  Exam reveals no gallop and no friction rub.   No murmur heard. Pulmonary/Chest: Effort normal and breath sounds normal. No stridor. No respiratory distress. She has no wheezes. She has no rales. She exhibits no tenderness.  Abdominal: Soft. Bowel sounds are normal. She exhibits no distension and no mass. There is no tenderness. There is no rebound and no guarding.  Musculoskeletal: Normal range of motion. She exhibits  no edema and no tenderness.  Lymphadenopathy:    She has no cervical adenopathy.  Neurological: She is alert and oriented to person, place, and time. She has normal reflexes. No cranial nerve deficit. She exhibits normal muscle tone. Coordination normal.  Skin: Skin is warm and dry. No rash noted. No erythema. No pallor.  Psychiatric: She has a normal mood and affect. Her behavior is normal.  Flat affect, poor insight and judgment    ED Course  Procedures (including critical care time) Labs Review Labs Reviewed  URINE RAPID DRUG SCREEN (HOSP PERFORMED) - Abnormal; Notable for the following:    Opiates POSITIVE (*)    Cocaine POSITIVE (*)    All other components within normal limits  CBC WITH DIFFERENTIAL - Abnormal; Notable for the following:    Hemoglobin 8.0 (*)    HCT 27.2 (*)    MCV 61.4 (*)    MCH 18.1 (*)    MCHC 29.4 (*)    RDW 18.3 (*)    All other components within normal limits  COMPREHENSIVE METABOLIC PANEL - Abnormal; Notable for the following:    Potassium 3.6 (*)    Total Bilirubin <0.2 (*)    GFR calc non Af Amer 86 (*)    All other components within normal limits  PREGNANCY, URINE  ETHANOL   Imaging Review No results found.   EKG Interpretation None      MDM   Final diagnoses:  Heroin addiction  Anemia  Dental disease    33 year old female with heroin addiction.  Patient also has dental disease and anemia.  She is medically cleared.  We'll start her back on her daily iron, treat dental infection as well.   the patient has been advised she will not receive narcotics while she is going to the detox process.  Will start on clonidine withdrawal protocol.  [facility] has seen the patient, and will work on getting her into a rehabilitation facility.    Olivia Mackie, MD 04/17/13 [phone]

## 2013-04-17 NOTE — ED Notes (Signed)
Pt requests detox from heroin and pills, also wants refferal for dental pain.

## 2013-04-17 NOTE — ED Notes (Signed)
PATIENT HAS DECIDED SHE DOESN'T WANT TO GO TO BEHAVIORAL HEALTH BECAUSE SHE CANT SMOKE THERE. STATES SHE IS GOING TO GO HOME TO HER MOTHERS HOUSE

## 2013-04-17 NOTE — ED Notes (Signed)
Patient declined at [facility] due to seizure disorder. Per alaina at [facility]

## 2013-04-17 NOTE — ED Notes (Signed)
SPOKE TO SHARON AT [facility]. SHE HAS NOT RECEIVED A REFERRAL ON THIS PATIENT. THIS NURSE FAXED INFO TO [facility]. SHE ADVISES SHE MAY HAVE A BED FOR PT TONIGHT. STATES IT WILL BE A FEW HOURS UNTIL SHE IS SURE OF THIS

## 2013-04-17 NOTE — ED Provider Notes (Signed)
3:04 PM Pt no longer wishes to stay for detox and would like to leave. Welcomed her return if she wishes. Return precautions provided.   Clinical Impression 1. Heroin addiction   2. Anemia   3. Dental disease      Junius ArgyleForrest S Sana Tessmer, MD 04/17/13 1505

## 2013-04-17 NOTE — Discharge Instructions (Signed)
Dental Pain Toothache is pain in or around a tooth. It may get worse with chewing or with cold or heat.  HOME CARE  Your dentist may use a numbing medicine during treatment. If so, you may need to avoid eating until the medicine wears off. Ask your dentist about this.  Only take medicine as told by your dentist or doctor.  Avoid chewing food near the painful tooth until after all treatment is done. Ask your dentist about this. GET HELP RIGHT AWAY IF:   The problem gets worse or new problems appear.  You have a fever.  There is redness and puffiness (swelling) of the face, jaw, or neck.  You cannot open your mouth.  There is pain in the jaw.  There is very bad pain that is not helped by medicine. MAKE SURE YOU:   Understand these instructions.  Will watch your condition.  Will get help right away if you are not doing well or get worse. Document Released: 07/17/2007 Document Revised: 04/22/2011 Document Reviewed: 07/17/2007 [facility] Patient Information 2014 [place], [place].  Dental Care and Dentist Visits Dental care supports good overall health. Regular dental visits can also help you avoid dental pain, bleeding, infection, and other more serious health problems in the future. It is important to keep the mouth healthy because diseases in the teeth, gums, and other oral tissues can spread to other areas of the body. Some problems, such as diabetes, heart disease, and pre-term labor have been associated with poor oral health.  See your dentist every 6 months. If you experience emergency problems such as a toothache or broken tooth, go to the dentist right away. If you see your dentist regularly, you may catch problems early. It is easier to be treated for problems in the early stages.  WHAT TO EXPECT AT A DENTIST VISIT  Your dentist will look for many common oral health problems and recommend proper treatment. At your regular dental visit, you can expect:  Gentle cleaning of the  teeth and gums. This includes scraping and polishing. This helps to remove the sticky substance around the teeth and gums (plaque). Plaque forms in the mouth shortly after eating. Over time, plaque hardens on the teeth as tartar. If tartar is not removed regularly, it can cause problems. Cleaning also helps remove stains.  Periodic X-rays. These pictures of the teeth and supporting bone will help your dentist assess the health of your teeth.  Periodic fluoride treatments. Fluoride is a natural mineral shown to help strengthen teeth. Fluoride treatmentinvolves applying a fluoride gel or varnish to the teeth. It is most commonly done in children.  Examination of the mouth, tongue, jaws, teeth, and gums to look for any oral health problems, such as:  Cavities (dental caries). This is decay on the tooth caused by plaque, sugar, and acid in the mouth. It is best to catch a cavity when it is small.  Inflammation of the gums caused by plaque buildup (gingivitis).  Problems with the mouth or malformed or misaligned teeth.  Oral cancer or other diseases of the soft tissues or jaws. KEEP YOUR TEETH AND GUMS HEALTHY For healthy teeth and gums, follow these general guidelines as well as your dentist's specific advice:  Have your teeth professionally cleaned at the dentist every 6 months.  Brush twice daily with a fluoride toothpaste.  Floss your teeth daily.  Ask your dentist if you need fluoride supplements, treatments, or fluoride toothpaste.  Eat a healthy diet. Reduce foods and drinks  with added sugar.  Avoid smoking. TREATMENT FOR ORAL HEALTH PROBLEMS If you have oral health problems, treatment varies depending on the conditions present in your teeth and gums.  Your caregiver will most likely recommend good oral hygiene at each visit.  For cavities, gingivitis, or other oral health disease, your caregiver will perform a procedure to treat the problem. This is typically done at a  separate appointment. Sometimes your caregiver will refer you to another dental specialist for specific tooth problems or for surgery. SEEK IMMEDIATE DENTAL CARE IF:  You have pain, bleeding, or soreness in the gum, tooth, jaw, or mouth area.  A permanent tooth becomes loose or separated from the gum socket.  You experience a blow or injury to the mouth or jaw area. Document Released: 10/10/2010 Document Revised: 04/22/2011 Document Reviewed: 10/10/2010 [facility] Patient Information 2014 [place], [place].  Emergency Department Resource Guide 1) Find a Doctor and Pay Out of Pocket Although you won't have to find out who is covered by your insurance plan, it is a good idea to ask around and get recommendations. You will then need to call the office and see if the doctor you have chosen will accept you as a new patient and what types of options they offer for patients who are self-pay. Some doctors offer discounts or will set up payment plans for their patients who do not have insurance, but you will need to ask so you aren't surprised when you get to your appointment.  2) Contact Your Local Health Department Not all health departments have doctors that can see patients for sick visits, but many do, so it is worth a call to see if yours does. If you don't know where your local health department is, you can check in your phone book. The CDC also has a tool to help you locate your state's health department, and many state websites also have listings of all of their local health departments.  3) Find a Walk-in Clinic If your illness is not likely to be very severe or complicated, you may want to try a walk in clinic. These are popping up all over the country in pharmacies, drugstores, and shopping centers. They're usually staffed by nurse practitioners or physician assistants that have been trained to treat common illnesses and complaints. They're usually fairly quick and inexpensive. However, if you have  serious medical issues or chronic medical problems, these are probably not your best option.  No Primary Care Doctor: - Call Health Connect at  [phone] - they can help you locate a primary care doctor that  accepts your insurance, provides certain services, etc. - Physician Referral Service- [phone]  Chronic Pain Problems: Organization         Address  Phone   Notes  [facility]  [phone] Patients need to be referred by their primary care doctor.   Medication Assistance: Organization         Address  Phone   Notes  [facility] Medication [facility] [address] [place], [place] [address] [phone] --Must be a resident of [facility] -- Must have NO insurance coverage whatsoever (no Medicaid/ Medicare, etc.) -- The pt. MUST have a primary care doctor that directs their care regularly and follows them in the community   [facility]ist  [phone]   [employer]  [phone]    Agencies that provide inexpensive medical care: Organization         Address  Phone                                                                            Notes  [facility]  [phone]   [facility]    [phone]   [facility] [address] [place], [place] [address] [phone]   [facility] [address] [place] [place] [phone]   Planned Parenthood    [phone]   [facility]    [phone]   [facility] and [facility]  [address], [place] Phone:  [phone], Fax:  [phone] Hours of Operation:  9 am - 6 pm, M-F.  Also accepts Medicaid/Medicare and self-pay.  [facility]  [address], [place] Phone: [phone], Fax: [phone]. Hours of Operation:  8:30 am - 5:30 pm, M-F.  Also accepts Medicaid and  self-pay.  [facility] [place] [address] [place] Phone: [phone]   [facility] [address] Natasha Bence [place], [place] [phone], Ext. [phone] Mondays & Thursdays: 7-9 AM.  First 15 patients are seen on a first come, first serve basis.    Medicaid-accepting [facility] Providers:  Organization         Address                                                                       Phone                               Notes  [facility] [address], [place] [phone] Also accepts self-pay patients.  [facility] [address] Laurell Josephs [place], [place]  [phone]   [facility] [address], [place] [phone]   [facility] [address] [place] [phone]   Renaye Rakers [address], [place]   [phone] Only accepts [place] Access [place] patients after they have their name applied to their card.   Self-Pay (no insurance) in [facility]:   Organization         Address                                                     Phone               Notes  Sickle Cell Patients, [facility] [address] [place], [place] [phone]   [facility] [address] [place], [place] [phone]   [facility]  [address], [place] [phone]   [facility]/Dr. Osei-Bonsu  [address], [place] or [address], [place] [phone] Phone number for both [place] and [place] locations is the same.  [facility] and [facility] [address], [place] [phone]   [facility] [address] [place] or [address] [phone]   [facility] [address], [place] [phone], phone; [phone], fax Sees patients 1st and 3rd  Saturday of every month.  Must not qualify for public or private insurance (i.e. Medicaid, Medicare, [place] Health Choice, Veterans' Benefits)  Household income should be no more than 200% of the poverty level The clinic cannot treat you if you are pregnant or think you are pregnant  Sexually transmitted diseases are not treated at the clinic.    _______________Dental Care:___________________________ [employer]         Address                                  Phone                       Notes  [facility] Department of [facility] [address] [place], [place] [phone] Accepts children up to age 53 who are enrolled in [place] or [place] Health Choice; pregnant women with a Medicaid card; and children who have applied for Medicaid or [place] Health Choice, but were declined, whose parents can pay a reduced fee at time of service.  [facility] Department of [facility]  [address], [place] [phone] Accepts children up to age 15 who are enrolled in [place] or [place] Health Choice; pregnant women with a Medicaid card; and children who have applied for Medicaid or [place] Health Choice, but were declined, whose parents can pay a reduced fee at time of service.  [facility]  [address] [place], [place] [phone] Patients are seen by appointment only. Walk-ins are not accepted. [facility] will see patients 27 years of age and older. Monday - Tuesday (8am-5pm) Most Wednesdays (8:30-5pm) $30 per visit, cash only  [facility] Adult [facility] Access [facility]  [address], [facility] [phone] Patients are seen by appointment only. Walk-ins are not accepted. [facility] will see patients 31 years of age and older. One Wednesday Evening (Monthly: Volunteer Based).  $30 per visit, cash only  [employer]  [phone] for adults; Children under age 33, call Graduate Pediatric Dentistry at  [phone]. Children aged 22-14, please call [phone] to request a pediatric application.  Dental services are provided in all areas of dental care including fillings, crowns and bridges, complete and partial dentures, implants, gum treatment, root canals, and extractions. Preventive care is also provided. Treatment is provided to both adults and children. Patients are selected via a lottery and there is often a waiting list.   [facility] [address], [place]  [phone] [email or web address]   [facility] [address] [place], [place] [phone], Ext. [phone] Second and Fourth Thursday of each month, opens at 6:30 AM; Clinic ends at 9 AM.  Patients are seen on a first-come first-served basis, and a limited number are seen during each clinic.   [facility]  [address], [place], [place] [phone]   Eligibility Requirements You must have lived in [place], [place], or [place] counties for at least the last three months.   You cannot be eligible for state or federal sponsored [place]healthcare insurance, including CIGNAVeterans Administration, [place]icaid, or [employer]icare.   You generally cannot be eligible for healthcare insurance through your employer.    How to apply: Eligibility screenings are held every Tuesday and Wednesday afternoon from 1:00 pm until 4:00 pm. You do not need an appointment for the interview!  [facility] [address], [place], [place] [phone]   [facility]  [phone]   [facility]  [phone]   [facility]  [phone]    Behavioral Health Resources in the Community: Intensive Outpatient Programs Organization         Address                                              Phone              Notes  [facility] [address], [place], [place] [phone]   [facility] [address], [place], [place] [phone]   [facility] [address], [place], [place]  [phone]   [facility] [address],  [place], [place] [phone] or [phone]   Substance Abuse Resources Organization         Address                                Phone  Notes  [facility]  [phone]   [facility]  [phone]   [facility]  [phone]   [facility]  [phone]   Residential & Outpatient Substance Abuse Program  [phone]   Psychological Services Organization         Address                                  Phone                Notes  [facility]  [phone]   [facility]  [phone]- [phone]   [facility] [address], [place] [phone] or [phone]    Mobile Crisis Teams Organization         Address  Phone  Notes  [facility], Mobile Crisis Care Unit  [phone]   [facility]  [address] [place], [place] [phone]   Doristine LocksSharon DeEsch [address] [place] [phone]    Self-Help/Support Groups Organization         Address                         Phone             Notes  [facility] [place] - variety of support groups  336- [phone] Call for more information  Narcotics Anonymous (NA), [facility] [address], [place]  2 meetings at this location   Statisticianesidential Treatment Programs Organization  Address                                                    Phone              Notes  [facility] [address]    [place]  [phone]   [facility]  [address] [place] [address], [place], [place] [phone]   [facility] Treatment Facility [address] [place], [place] [phone] Admissions: 8am-3pm M-F  [facility] [address],    [place], [place] [phone]   [facility] [address] [place] [address] [place], [place], [place] [phone]   [facility] [address]  [place], [place] [phone]   [facility] - Intensive Outpatient [address] Laurell Joseph[address], [place], [place] [phone]   [facility] (Addiction Recovery [facility] [address] [place]  [place], [place] [phone] or [phone]   [facility] ([facility]) [address] [place], [place] [phone] Accepts Medicaid  [facility] [address],  [place] [phone] Substance Abuse/Addiction Treatment   [facility] Organization         Address                                                            Phone                    Notes  [facility]  [phone]   Angie Fava, PhD [address] Ervin Knack [place], [place]   [phone] or [phone]   [facility]   [address] [place], [place] [phone]   [facility] [address], [place], [place] [phone] Insurance/Medicaid/sponsorship through [facility] and Families [address]                                    [place], [place] [phone] Therapy/tele-psych/case  [facility] [address], [place] [phone]    Dr. Lolly Mustache  [phone]   [facility]  [employer] [facility] Dept. 1) [address] [place] 2) [address] [place] 3)  [address], [place] [phone]  [phone]   [facility] Child Abuse Hotline [phone] or [phone] (After Hours)

## 2013-04-17 NOTE — ED Notes (Signed)
Patient belonging have been inventory and sent to holding area as well as valuables have been lock up in security.

## 2013-04-17 NOTE — BH Assessment (Signed)
[facility] Nanine MeansJamison Lord NP, patient will be going to [phone]. Drinda ButtsAnnette RN at [facility] made aware.

## 2013-04-17 NOTE — ED Notes (Signed)
Pt given paper scrubs. 

## 2013-04-17 NOTE — ED Notes (Signed)
PER ALAINA AT [facility] PT IS ACCEPTED. SHE IS LOOKING AT ANOTHER REFERRRAL AND WILL CALL BACK WITH TIME TO TRANSPORT

## 2013-04-17 NOTE — Progress Notes (Signed)
[facility], [facility],   [facility]

## 2013-04-17 NOTE — BH Assessment (Signed)
Received a call for a tele-assessment. Spoke with Dr. Norlene Campbelltter who reported that patient presented to the ED earlier today for dental pain and was given a script for Tylenol with codeine. Pt is planning to got to the dentis to get her teeth fix on Monday; however patient is also requesting detox from heroin. Pt reported using heroin daily and denied any cocaine use; however she tested positive for cocaine. Pt also denied alcohol use.  Patient also has a diagnosis of bipolar. Tele-assessment will be initiated.

## 2013-04-17 NOTE — ED Provider Notes (Signed)
CSN: [id number]     Arrival date & time 04/17/13  0032 History   First MD Initiated Contact with Patient 04/17/13 0051     Chief Complaint  Patient presents with  . Dental Pain     (Consider location/radiation/quality/duration/timing/severity/associated sxs/prior Treatment) HPI Pt is a 33yo female with hx of depression, bipolar disorder, and seizures c/o upper and lower bilateral dental pain x3 weeks.  Pain is constant, aching and throbbing, 10/10. Worse with hot and cold temperatures. Reports nausea and vomiting x2 today due to pain.  Denies fevers. Reports being seen by Dr. Mila Palmer, DDS, 02/18/13 and referred to [facility] for complete dental extraction. Pt reports bad experience in past and is requesting another referral. Has taken Tylenol #3 with amoxicillin but is out and it only provided "some" relief.  Pt requesting more pain medication and referral. Denies difficulty breathing or swallowing.    Past Medical History  Diagnosis Date  . Depression   . Bipolar 1 disorder   . Herpes   . Seizures    Past Surgical History  Procedure Laterality Date  . Cesarean section     No family history on file. History  Substance Use Topics  . Smoking status: Current Every Day Smoker    Types: Cigarettes  . Smokeless tobacco: Not on file  . Alcohol Use: No   OB History   Grav Para Term Preterm Abortions TAB SAB Ect Mult Living                 Review of Systems  Constitutional: Negative for fever and chills.  HENT: Positive for dental problem. Negative for sore throat.   Gastrointestinal: Positive for nausea and vomiting ( x2). Negative for abdominal pain.  All other systems reviewed and are negative.      Allergies  Tramadol  Home Medications   Current Outpatient Rx  Name  Route  Sig  Dispense  Refill  . acetaminophen-codeine 120-12 MG/5ML solution   Oral   Take 5 mLs by mouth every 4 (four) hours as needed for moderate pain.   120 mL   0   . amoxicillin-clavulanate  (AUGMENTIN) 875-125 MG per tablet   Oral   Take 1 tablet by mouth every 12 (twelve) hours.   14 tablet   0   . ibuprofen (ADVIL,MOTRIN) 200 MG tablet   Oral   Take 800 mg by mouth every 2 (two) hours as needed. For pain          BP 142/93  Pulse 92  Temp(Src) 97.7 F (36.5 C) (Oral)  Resp 18  Wt 102 lb 2 oz (46.324 kg)  SpO2 100% Physical Exam  Nursing note and vitals reviewed. Constitutional: She is oriented to person, place, and time. She appears well-developed and well-nourished.  HENT:  Head: Normocephalic and atraumatic.  Right Ear: Hearing, tympanic membrane, external ear and ear canal normal.  Left Ear: Hearing, tympanic membrane, external ear and ear canal normal.  Nose: Nose normal.  Mouth/Throat: Uvula is midline, oropharynx is clear and moist and mucous membranes are normal. Abnormal dentition. Dental caries present. No dental abscesses. No oropharyngeal exudate, posterior oropharyngeal edema, posterior oropharyngeal erythema or tonsillar abscesses.  Multiple missing teeth and dental caries into the pulp.  Tenderness along upper and lower jaw line. No dental abscess requiring I&D at this time. Airway clear. No tonsillar abscess.   Eyes: EOM are normal.  Neck: Normal range of motion. Neck supple.  Cardiovascular: Normal rate.   Pulmonary/Chest: Effort normal.  No stridor.  Musculoskeletal: Normal range of motion.  Neurological: She is alert and oriented to person, place, and time.  Skin: Skin is warm and dry.  Psychiatric: She has a normal mood and affect. Her behavior is normal.    ED Course  Procedures (including critical care time) Labs Review Labs Reviewed - No data to display Imaging Review No results found.   EKG Interpretation None      MDM   Final diagnoses:  Dental caries into pulp  Pain due to dental caries    Pt with multiple dental caries requesting referral to dentist for tooth extraction. Pt reports consult by Dr. Mila PalmerMcGee, DDS, on 02/18/13  and [facility], however states she does not want to go back there due to bad experience.  On exam, no respiratory distress. No abscess requiring I&D at this time.  Will tx symptomatically. Rx: Tyelnol #3 and augmentin.  F/u with Dr. Lucky CowboyKnox, DDS, call to make an appointment on Monday, 3/9 for further evaluation and tx.  Resource guide also provided. Return precautions provided. Pt verbalized understanding and agreement with tx plan.     Junius FinnerErin O'Malley, PA-C 04/17/13 0127

## 2013-04-17 NOTE — ED Notes (Signed)
The pt needs her teeth pulled and she came here for a referral and she is c/o pain for 3 weeks
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
## 2013-04-18 NOTE — ED Provider Notes (Signed)
Medical screening examination/treatment/procedure(s) were performed by non-physician practitioner and as supervising physician I was immediately available for consultation/collaboration.   Laurielle Selmon, MD 04/18/13 0753 

## 2013-10-24 ENCOUNTER — Emergency Department (HOSPITAL_COMMUNITY)
Admission: EM | Admit: 2013-10-24 | Discharge: 2013-10-25 | Disposition: A | Discharge status: Home / Self Care | Payer: Medicaid Other | Attending: Emergency Medicine | Admitting: Emergency Medicine

## 2013-10-24 ENCOUNTER — Encounter (HOSPITAL_COMMUNITY): Payer: Self-pay | Admitting: Emergency Medicine

## 2013-10-24 DIAGNOSIS — G40909 Epilepsy, unspecified, not intractable, without status epilepticus: Secondary | ICD-10-CM | POA: Insufficient documentation

## 2013-10-24 DIAGNOSIS — F17200 Nicotine dependence, unspecified, uncomplicated: Secondary | ICD-10-CM | POA: Insufficient documentation

## 2013-10-24 DIAGNOSIS — Z8619 Personal history of other infectious and parasitic diseases: Secondary | ICD-10-CM | POA: Diagnosis not present

## 2013-10-24 DIAGNOSIS — N898 Other specified noninflammatory disorders of vagina: Secondary | ICD-10-CM | POA: Diagnosis not present

## 2013-10-24 DIAGNOSIS — F1310 Sedative, hypnotic or anxiolytic abuse, uncomplicated: Secondary | ICD-10-CM | POA: Insufficient documentation

## 2013-10-24 DIAGNOSIS — F1110 Opioid abuse, uncomplicated: Secondary | ICD-10-CM | POA: Diagnosis not present

## 2013-10-24 DIAGNOSIS — Z3202 Encounter for pregnancy test, result negative: Secondary | ICD-10-CM | POA: Diagnosis not present

## 2013-10-24 DIAGNOSIS — Z008 Encounter for other general examination: Secondary | ICD-10-CM | POA: Insufficient documentation

## 2013-10-24 LAB — URINALYSIS, ROUTINE W REFLEX MICROSCOPIC
Bilirubin Urine: NEGATIVE
GLUCOSE, UA: NEGATIVE mg/dL
HGB URINE DIPSTICK: NEGATIVE
Ketones, ur: NEGATIVE mg/dL
Leukocytes, UA: NEGATIVE
Nitrite: NEGATIVE
Protein, ur: NEGATIVE mg/dL
Specific Gravity, Urine: 1.005 (ref 1.005–1.030)
UROBILINOGEN UA: 0.2 mg/dL (ref 0.0–1.0)
pH: 6.5 (ref 5.0–8.0)

## 2013-10-24 LAB — COMPREHENSIVE METABOLIC PANEL
ALT: 8 U/L (ref 0–35)
ANION GAP: 11 (ref 5–15)
AST: 18 U/L (ref 0–37)
Albumin: 3.7 g/dL (ref 3.5–5.2)
Alkaline Phosphatase: 55 U/L (ref 39–117)
BUN: 11 mg/dL (ref 6–23)
CALCIUM: 8.4 mg/dL (ref 8.4–10.5)
CO2: 25 mEq/L (ref 19–32)
Chloride: 100 mEq/L (ref 96–112)
Creatinine, Ser: 0.83 mg/dL (ref 0.50–1.10)
GFR calc non Af Amer: >90 mL/min (ref >90)
GLUCOSE: 78 mg/dL (ref 70–99)
Potassium: 4.3 mEq/L (ref 3.7–5.3)
Sodium: 136 mEq/L — ABNORMAL LOW (ref 137–147)
TOTAL PROTEIN: 7.2 g/dL (ref 6.0–8.3)
Total Bilirubin: 0.2 mg/dL — ABNORMAL LOW (ref 0.3–1.2)

## 2013-10-24 LAB — RAPID URINE DRUG SCREEN, HOSP PERFORMED
Amphetamines: NOT DETECTED
Barbiturates: NOT DETECTED
Benzodiazepines: NOT DETECTED
Cocaine: NOT DETECTED
OPIATES: NOT DETECTED
Tetrahydrocannabinol: NOT DETECTED

## 2013-10-24 LAB — ACETAMINOPHEN LEVEL: Acetaminophen (Tylenol), Serum: <15 ug/mL (ref 10–30)

## 2013-10-24 LAB — CBC
HEMATOCRIT: 25.6 % — AB (ref 36.0–46.0)
Hemoglobin: 7.5 g/dL — ABNORMAL LOW (ref 12.0–15.0)
MCH: 18.2 pg — AB (ref 26.0–34.0)
MCHC: 29.3 g/dL — AB (ref 30.0–36.0)
MCV: 62 fL — ABNORMAL LOW (ref 78.0–100.0)
Platelets: 193 10*3/uL (ref 150–400)
RBC: 4.13 MIL/uL (ref 3.87–5.11)
RDW: 19.8 % — ABNORMAL HIGH (ref 11.5–15.5)
WBC: 5.4 10*3/uL (ref 4.0–10.5)

## 2013-10-24 LAB — SALICYLATE LEVEL

## 2013-10-24 LAB — ETHANOL: Alcohol, Ethyl (B): <11 mg/dL (ref 0–11)

## 2013-10-24 LAB — PREGNANCY, URINE: PREG TEST UR: NEGATIVE

## 2013-10-24 MED ORDER — LORAZEPAM 1 MG PO TABS
1.0000 mg | ORAL_TABLET | Freq: Four times a day (QID) | ORAL | Status: DC | PRN
  Administered 2013-10-24 – 2013-10-25 (×3): 1 mg via ORAL
  Filled 2013-10-24 (×4): qty 1

## 2013-10-24 MED ORDER — ONDANSETRON 4 MG PO TBDP
4.0000 mg | ORAL_TABLET | Freq: Four times a day (QID) | ORAL | Status: DC | PRN
  Administered 2013-10-24: 4 mg via ORAL
  Filled 2013-10-24: qty 1

## 2013-10-24 MED ORDER — NICOTINE 21 MG/24HR TD PT24
21.0000 mg | MEDICATED_PATCH | Freq: Every day | TRANSDERMAL | Status: DC
  Administered 2013-10-24 – 2013-10-25 (×2): 21 mg via TRANSDERMAL
  Filled 2013-10-24 (×2): qty 1

## 2013-10-24 MED ORDER — LORAZEPAM 1 MG PO TABS
1.0000 mg | ORAL_TABLET | Freq: Three times a day (TID) | ORAL | Status: DC | PRN
  Administered 2013-10-24: 1 mg via ORAL

## 2013-10-24 MED ORDER — LORAZEPAM 2 MG/ML IJ SOLN: 1.0000 mg | Freq: Four times a day (QID) | INTRAMUSCULAR | Status: DC | PRN

## 2013-10-24 MED ORDER — ACETAMINOPHEN 325 MG PO TABS
650.0000 mg | ORAL_TABLET | ORAL | Status: DC | PRN
  Administered 2013-10-24 – 2013-10-25 (×3): 650 mg via ORAL
  Filled 2013-10-24 (×3): qty 2

## 2013-10-24 MED ORDER — ALUM & MAG HYDROXIDE-SIMETH 200-200-20 MG/5ML PO SUSP: 30.0000 mL | ORAL | Status: DC | PRN

## 2013-10-24 MED ORDER — NAPROXEN 250 MG PO TABS
500.0000 mg | ORAL_TABLET | Freq: Two times a day (BID) | ORAL | Status: DC | PRN
  Administered 2013-10-24: 500 mg via ORAL
  Filled 2013-10-24: qty 2

## 2013-10-24 MED ORDER — LORAZEPAM 1 MG PO TABS: 1.0000 mg | ORAL_TABLET | Freq: Three times a day (TID) | ORAL | Status: DC | PRN

## 2013-10-24 MED ORDER — LOPERAMIDE HCL 2 MG PO CAPS: 2.0000 mg | ORAL_CAPSULE | ORAL | Status: DC | PRN

## 2013-10-24 MED ORDER — VITAMIN B-1 100 MG PO TABS
100.0000 mg | ORAL_TABLET | Freq: Every day | ORAL | Status: DC
  Administered 2013-10-24: 100 mg via ORAL
  Filled 2013-10-24: qty 1

## 2013-10-24 MED ORDER — METHOCARBAMOL 500 MG PO TABS
500.0000 mg | ORAL_TABLET | Freq: Three times a day (TID) | ORAL | Status: DC | PRN
  Administered 2013-10-24 – 2013-10-25 (×3): 500 mg via ORAL
  Filled 2013-10-24 (×3): qty 1

## 2013-10-24 MED ORDER — VITAMIN B-1 100 MG PO TABS
100.0000 mg | ORAL_TABLET | Freq: Every day | ORAL | Status: DC
  Administered 2013-10-24 – 2013-10-25 (×2): 100 mg via ORAL
  Filled 2013-10-24 (×2): qty 1

## 2013-10-24 MED ORDER — FOLIC ACID 1 MG PO TABS
1.0000 mg | ORAL_TABLET | Freq: Every day | ORAL | Status: DC
  Administered 2013-10-24 – 2013-10-25 (×2): 1 mg via ORAL
  Filled 2013-10-24 (×2): qty 1

## 2013-10-24 MED ORDER — ADULT MULTIVITAMIN W/MINERALS CH
1.0000 | ORAL_TABLET | Freq: Every day | ORAL | Status: DC
  Administered 2013-10-24 – 2013-10-25 (×2): 1 via ORAL
  Filled 2013-10-24 (×2): qty 1

## 2013-10-24 MED ORDER — ONDANSETRON HCL 4 MG PO TABS
4.0000 mg | ORAL_TABLET | Freq: Three times a day (TID) | ORAL | Status: DC | PRN
  Administered 2013-10-25: 4 mg via ORAL
  Filled 2013-10-24: qty 1

## 2013-10-24 MED ORDER — IBUPROFEN 400 MG PO TABS
600.0000 mg | ORAL_TABLET | Freq: Three times a day (TID) | ORAL | Status: DC | PRN
  Administered 2013-10-24 – 2013-10-25 (×2): 600 mg via ORAL
  Filled 2013-10-24 (×4): qty 1

## 2013-10-24 MED ORDER — HYDROXYZINE HCL 25 MG PO TABS
25.0000 mg | ORAL_TABLET | Freq: Four times a day (QID) | ORAL | Status: DC | PRN
  Administered 2013-10-24 – 2013-10-25 (×3): 25 mg via ORAL
  Filled 2013-10-24 (×3): qty 1

## 2013-10-24 MED ORDER — THIAMINE HCL 100 MG/ML IJ SOLN: 100.0000 mg | Freq: Every day | INTRAMUSCULAR | Status: DC

## 2013-10-24 MED ORDER — FERROUS SULFATE 325 (65 FE) MG PO TABS
325.0000 mg | ORAL_TABLET | Freq: Every day | ORAL | Status: DC
Start: 2013-10-25 — End: 2013-10-25
  Administered 2013-10-25: 325 mg via ORAL
  Filled 2013-10-24 (×2): qty 1

## 2013-10-24 MED ORDER — SODIUM CHLORIDE 0.9 % IV BOLUS (SEPSIS)
1000.0000 mL | Freq: Once | INTRAVENOUS | Status: AC
  Administered 2013-10-24: 1000 mL via INTRAVENOUS

## 2013-10-24 MED ORDER — DICYCLOMINE HCL 20 MG PO TABS
20.0000 mg | ORAL_TABLET | Freq: Four times a day (QID) | ORAL | Status: DC | PRN
  Administered 2013-10-24 – 2013-10-25 (×3): 20 mg via ORAL
  Filled 2013-10-24 (×3): qty 1

## 2013-10-24 NOTE — ED Provider Notes (Signed)
Patient requesting detox from a pan a, Xanax, and methadone. She last used these drugs yesterday at 5 AM. She presently complains of diffuse myalgias. Patient is alert Glasgow Coma Score 15., Moves all extremities well. Skin warm and dry. She has needle marks in bilateral antecubital fossae with no signs of infection. Of note, patient with microcytic anemia which is essentially unchanged from March of 2015. She does report heavy menstrual periods. We will start patient on ferrous sulfate  Doug Sou, MD 10/24/13 1658

## 2013-10-24 NOTE — ED Notes (Signed)
Pt c/o muscle spasms.
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
## 2013-10-24 NOTE — ED Notes (Signed)
No answer in waiting room 

## 2013-10-24 NOTE — ED Provider Notes (Signed)
Medical screening examination/treatment/procedure(s) were conducted as a shared visit with non-physician practitioner(s) and myself.  I personally evaluated the patient during the encounter.   EKG Interpretation None       Doug Sou, MD 10/24/13 1658

## 2013-10-24 NOTE — Progress Notes (Signed)
Pt's referral has been faxed out to [facility].  [facility] per Tionne now at capacity; may have possible beds tomorrow 9.14. [facility] per Joni Reining at capacity

## 2013-10-24 NOTE — ED Notes (Signed)
Patient here with desire for detox. States that about 2 weeks ago she found the motivation and has been looking for a treatment facility since that time. States use of Opana, Benzodiazepines, and Methadone. States that she has not used today. Additionally complaining of vaginal discharge, burning, and itching.

## 2013-10-24 NOTE — BH Assessment (Signed)
[facility] Assessment Progress Note   Called [facility] to speak with PA Ranell Patrick to gain clinical information on the pt @ 0800 and pt's tele assessment scheduled with this clinician for 0810.  Called pt's nurse to schedule tele assessment.  Casimer Lanius, MS, [facility] Licensed Professional Counselor Triage Specialist

## 2013-10-24 NOTE — ED Provider Notes (Signed)
CSN: [id number]     Arrival date & time 10/24/13  0023 History   First MD Initiated Contact with Patient 10/24/13 [phone]     Chief Complaint  Patient presents with  . Addiction Problem  . Medical Clearance  . Vaginal Discharge    HPI Julie Sanford is a 33 y.o. female with PMH of depression, bipolar and seizures presenting for detox. Patient uses IV opana and had her last dose at 0500 yesterday morning. She also uses ativan and takes around 6 daily. Her last dose was around 0500 this morning. She also has used some methadone. Patient is presenting for detox because she has a 32 year old who will have cardiac surgery and wants to be clean for him. Patient denies SI, HI or self-harm or harm to others. Patient denies hallucinations, alcohol use or other drug use. Patient smokes. She has no other medical complaints today.    Past Medical History  Diagnosis Date  . Depression   . Bipolar 1 disorder   . Herpes   . Seizures    Past Surgical History  Procedure Laterality Date  . Cesarean section     No family history on file. History  Substance Use Topics  . Smoking status: Current Every Day Smoker -- 2.00 packs/day    Types: Cigarettes  . Smokeless tobacco: Not on file  . Alcohol Use: No   OB History   Grav Para Term Preterm Abortions TAB SAB Ect Mult Living                 Review of Systems  Constitutional: Negative for fever and chills.  HENT: Negative for congestion and rhinorrhea.   Eyes: Negative for visual disturbance.  Respiratory: Negative for cough and shortness of breath.   Cardiovascular: Negative for chest pain and palpitations.  Gastrointestinal: Negative for nausea, vomiting and diarrhea.  Genitourinary: Negative for dysuria and hematuria.  Musculoskeletal: Negative for back pain and gait problem.  Skin: Negative for rash.  Neurological: Negative for weakness and headaches.      Allergies  Tramadol  Home Medications   Prior to Admission medications   Not  on File   BP 123/57  Pulse 92  Temp(Src) 98.5 F (36.9 C) (Oral)  Resp 16  SpO2 98%  LMP 10/24/2013 Physical Exam  Nursing note and vitals reviewed. Constitutional: She appears well-developed and well-nourished. No distress.  HENT:  Head: Normocephalic and atraumatic.  Eyes: Conjunctivae and EOM are normal. Right eye exhibits no discharge. Left eye exhibits no discharge. No scleral icterus.  Cardiovascular: Normal rate, regular rhythm and normal heart sounds.   Pulmonary/Chest: Effort normal and breath sounds normal. No respiratory distress. She has no wheezes.  Abdominal: Soft. Bowel sounds are normal. She exhibits no distension. There is no tenderness.  Musculoskeletal: Normal range of motion. She exhibits no tenderness.  No midline back pain, crepitus or step off. No right or left sided back pain.  Neurological: She is alert. She exhibits normal muscle tone. Coordination normal.  Walks without assistance  Skin: Skin is warm and dry. She is not diaphoretic.  Needle marks on dorsal and palmar surfaces of bilateral arms that are not infected.  Psychiatric: She has a normal mood and affect. Her speech is normal and behavior is normal. Thought content is not paranoid and not delusional. She expresses no homicidal and no suicidal ideation.    ED Course  Procedures (including critical care time) Labs Review Labs Reviewed  CBC - Abnormal; Notable for  the following:    Hemoglobin 7.5 (*)    HCT 25.6 (*)    MCV 62.0 (*)    MCH 18.2 (*)    MCHC 29.3 (*)    RDW 19.8 (*)    All other components within normal limits  COMPREHENSIVE METABOLIC PANEL - Abnormal; Notable for the following:    Sodium 136 (*)    Total Bilirubin 0.2 (*)    All other components within normal limits  SALICYLATE LEVEL - Abnormal; Notable for the following:    Salicylate Lvl <2.0 (*)    All other components within normal limits  ACETAMINOPHEN LEVEL  ETHANOL  URINALYSIS, ROUTINE W REFLEX MICROSCOPIC   PREGNANCY, URINE  URINE RAPID DRUG SCREEN (HOSP PERFORMED)    Imaging Review No results found.   EKG Interpretation None     Meds given in ED:  Medications  ondansetron (ZOFRAN) tablet 4 mg (not administered)  nicotine (NICODERM CQ - dosed in mg/24 hours) patch 21 mg (21 mg Transdermal Patch Applied 10/24/13 0948)  alum & mag hydroxide-simeth (MAALOX/MYLANTA) 200-200-20 MG/5ML suspension 30 mL (not administered)  ibuprofen (ADVIL,MOTRIN) tablet 600 mg (600 mg Oral Given 10/24/13 0951)  acetaminophen (TYLENOL) tablet 650 mg (not administered)  LORazepam (ATIVAN) tablet 1 mg (not administered)  thiamine (VITAMIN B-1) tablet 100 mg (not administered)    Or  thiamine (B-1) injection 100 mg (not administered)  LORazepam (ATIVAN) tablet 1 mg (1 mg Oral Given 10/24/13 0952)    Or  LORazepam (ATIVAN) injection 1 mg ( Intravenous See Alternative 10/24/13 0952)  thiamine (VITAMIN B-1) tablet 100 mg (100 mg Oral Given 10/24/13 0949)    Or  thiamine (B-1) injection 100 mg ( Intravenous See Alternative 10/24/13 0949)  folic acid (FOLVITE) tablet 1 mg (not administered)  multivitamin with minerals tablet 1 tablet (not administered)  dicyclomine (BENTYL) tablet 20 mg (20 mg Oral Given 10/24/13 0952)  hydrOXYzine (ATARAX/VISTARIL) tablet 25 mg (not administered)  loperamide (IMODIUM) capsule 2-4 mg (not administered)  methocarbamol (ROBAXIN) tablet 500 mg (500 mg Oral Given 10/24/13 0951)  naproxen (NAPROSYN) tablet 500 mg (not administered)  ondansetron (ZOFRAN-ODT) disintegrating tablet 4 mg (4 mg Oral Given 10/24/13 0950)  ferrous sulfate tablet 325 mg (not administered)  sodium chloride 0.9 % bolus 1,000 mL (0 mLs Intravenous Stopped 10/24/13 1101)    New Prescriptions   No medications on file      MDM   Final diagnoses:  Opioid abuse  Benzodiazepine abuse   Patient with opioid and benzodiazepine abuse. Patient interested in detox. Patient has no medical complaints today. Medical  clearance largely unremarkable with mild anemia which is chronic for her. Consult to [facility] and [facility] who agree to admit for observation and potential placement at [facility] or [facility]. Patient moved to POD C.  Discussed all results and patient verbalizes understanding and agrees with plan.  This is a shared patient. This patient was discussed with the physician, Dr. Ethelda Chick who saw and evaluated the patient.       Louann Sjogren, PA-C 10/24/13 1104

## 2013-10-24 NOTE — Progress Notes (Signed)
Case Manager met patient at bedside role of Case Manager explained.Patient verbalizes ED visit is secondary to her request for Detox.Patient reports she has MEDICAID and was  Dropped by her PCP several months ago. Education provided on importance of re establishing care with a primary health provider.Patient provided with a resource for PCP  Provider.Patient given resource card for the [employer] [facility].No further CM needs at this time.Patient receptive to CM help and had no further questions.

## 2013-10-24 NOTE — BH Assessment (Signed)
[facility] Assessment Progress Note  Pt declined at [facility] due to seizure disorder.

## 2013-10-24 NOTE — ED Notes (Signed)
Telepsych machine at the bedside 

## 2013-10-25 ENCOUNTER — Observation Stay (HOSPITAL_COMMUNITY): Admission: AD | Admit: 2013-10-25 | Payer: PRIVATE HEALTH INSURANCE | Source: Intra-hospital | Admitting: Psychiatry

## 2013-10-25 DIAGNOSIS — F1910 Other psychoactive substance abuse, uncomplicated: Secondary | ICD-10-CM

## 2013-10-25 DIAGNOSIS — F1994 Other psychoactive substance use, unspecified with psychoactive substance-induced mood disorder: Secondary | ICD-10-CM

## 2013-10-25 DIAGNOSIS — F332 Major depressive disorder, recurrent severe without psychotic features: Secondary | ICD-10-CM

## 2013-10-25 MED ORDER — CLONIDINE HCL 0.1 MG PO TABS: 0.1000 mg | ORAL_TABLET | ORAL | Status: DC

## 2013-10-25 MED ORDER — DICYCLOMINE HCL 20 MG PO TABS: 20.0000 mg | ORAL_TABLET | Freq: Two times a day (BID) | ORAL | Status: DC

## 2013-10-25 MED ORDER — ONDANSETRON 4 MG PO TBDP: ORAL_TABLET | ORAL | Status: DC

## 2013-10-25 MED ORDER — CLONIDINE HCL 0.1 MG PO TABS
0.1000 mg | ORAL_TABLET | Freq: Four times a day (QID) | ORAL | Status: DC
  Administered 2013-10-25: 0.1 mg via ORAL
  Filled 2013-10-25: qty 1

## 2013-10-25 MED ORDER — CLONIDINE HCL 0.1 MG PO TABS: 0.1000 mg | ORAL_TABLET | Freq: Every day | ORAL | Status: DC

## 2013-10-25 NOTE — ED Provider Notes (Signed)
Behavioral health assessment and initially recommended discharge and then changed disposition to discharge with observation and behavioral health. Patient had no acute issues during my shift. Patient will be discharged and transported to behavior health observation. Filed Vitals:   10/25/13 0012 10/25/13 0652 10/25/13 0808 10/25/13 1024  BP: 134/87 129/83 124/94 128/89  Pulse: 94 68 88   Temp: 98.7 F (37.1 C) 98 F (36.7 C)    TempSrc: Oral Oral    Resp: SpO2: 100% 100% 100%     Heroin  abuse, cocaine abuse  Enid Skeens, MD 10/25/13 1341

## 2013-10-25 NOTE — BH Assessment (Signed)
Referrals faxed over for possible admission [facility]   The Following facilities were at Capacity:   [facility]- Per Eric   [facility]- [facility]   [facility]- Per Kim   [facility]- Per Terri   [facility]- Per Ladarrion Telfair   [facility]- Per Lea   [facility]- Per Missy   [facility]- Per Amber   [facility]- Per Margaret   [facility]- Per George   [facility]- Per Abee   [facility]- Per Angela [facility]   [facility]- Per Kevin   [facility]- Per Pat   [facility]-  Per Melanie   [facility]- Per Peggy   [facility]- Per Crystal   [facility]- Per Chris   [facility]- Per Wilma   [facility]- Per Amy   [facility]- Busy   [facility]- No Answer   [facility]- Per Katelyn  

## 2013-10-25 NOTE — ED Notes (Signed)
Patient has refused to go to [facility] today because she cant smoke at their facility. States she is going to find her own place to go

## 2013-10-25 NOTE — BH Assessment (Signed)
Conrad completed TP consult and is recommending that patient be d/c with outpatient SA referrals and crisis resources as needed.   Spoke with nursing secretary Debbie who transferred this Clinical research associate to [employer] and informed him of Conrad's recommendation and he is in agreement with d/c patient with the following recommendations noted. Pt's ED nurse Drinda Butts notified of disposition.   [name], MS, LCASA Assessment Counselor

## 2013-10-25 NOTE — BH Assessment (Signed)
This Clinical research associate faxed SA referrals and crisis resources to pt's nurse.  [name], MS, LCASA Assessment Counselor
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
## 2013-10-25 NOTE — Discharge Instructions (Signed)
If you were given medicines take as directed.  If you are on coumadin or contraceptives realize their levels and effectiveness is altered by many different medicines.  If you have any reaction (rash, tongues swelling, other) to the medicines stop taking and see a physician.   Please follow up as directed and return to the ER or see a physician for new or worsening symptoms.  Thank you. Filed Vitals:   10/25/13 0012 10/25/13 0652 10/25/13 0808 10/25/13 1024  BP: 134/87 129/83 124/94 128/89  Pulse: 94 68 88   Temp: 98.7 F (37.1 C) 98 F (36.7 C)    TempSrc: Oral Oral    Resp: SpO2: 100% 100% 100%     Emergency Department Resource Guide 1) Find a Doctor and Pay Out of Pocket Although you won't have to find out who is covered by your insurance plan, it is a good idea to ask around and get recommendations. You will then need to call the office and see if the doctor you have chosen will accept you as a new patient and what types of options they offer for patients who are self-pay. Some doctors offer discounts or will set up payment plans for their patients who do not have insurance, but you will need to ask so you aren't surprised when you get to your appointment.  2) Contact Your Local Health Department Not all health departments have doctors that can see patients for sick visits, but many do, so it is worth a call to see if yours does. If you don't know where your local health department is, you can check in your phone book. The CDC also has a tool to help you locate your state's health department, and many state websites also have listings of all of their local health departments.  3) Find a Walk-in Clinic If your illness is not likely to be very severe or complicated, you may want to try a walk in clinic. These are popping up all over the country in pharmacies, drugstores, and shopping centers. They're usually staffed by nurse practitioners or physician assistants that have been  trained to treat common illnesses and complaints. They're usually fairly quick and inexpensive. However, if you have serious medical issues or chronic medical problems, these are probably not your best option.  No Primary Care Doctor: - Call Health Connect at  [phone] - they can help you locate a primary care doctor that  accepts your insurance, provides certain services, etc. - Physician Referral Service- [phone]  Chronic Pain Problems: Organization         Address  Phone   Notes  [facility]  [phone] Patients need to be referred by their primary care doctor.   Medication Assistance: Organization         Address  Phone   Notes  [facility] Medication [facility] [address] [place], [place] [address] [phone] --Must be a resident of [facility] -- Must have NO insurance coverage whatsoever (no Medicaid/ Medicare, etc.) -- The pt. MUST have a primary care doctor that directs their care regularly and follows them in the community   [facility]ist  [phone]   [employer]  [phone]    Agencies that provide inexpensive medical care: Organization         Address  Phone   Notes  [facility]  [phone]   [facility]    (  [phone]   [facility] [address] [place], [place] [address] [phone]   [facility] [address] [place], [place] [phone]   Planned Parenthood    [phone]   [facility]    [phone]   [facility]  [address], [place] Phone:  [phone], Fax:  [phone] Hours of Operation:  9 am - 6 pm, M-F.  Also accepts Medicaid/Medicare and self-pay.  [facility]  [address], [place] Phone: [phone], Fax: [phone]. Hours of Operation:  8:30 am - 5:30 pm, M-F.  Also accepts Medicaid and self-pay.   [facility] [place] [address] [place] Phone: [phone]   [facility] [address] Natasha Bence [place], [place] [phone], Ext. [phone] Mondays & Thursdays: 7-9 AM.  First 15 patients are seen on a first come, first serve basis.    Medicaid-accepting [facility] Providers:  Organization         Address  Phone   Notes  [facility] [address], [place] [phone] Also accepts self-pay patients.  [facility] [address] Laurell Josephs [place], [place]  [phone]   [facility] [address], [place] [phone]   [facility] [address] [place] [phone]   Renaye Rakers [address], [place]   [phone] Only accepts [place] patients after they have their name applied to their card.   Self-Pay (no insurance) in [facility]:  Organization         Address  Phone   Notes  Sickle Cell Patients, [facility] [address] [place], [place] [phone]   [facility] [address] [place], [place] [phone]   [facility] [place]  [address], [place] [phone]   [facility]/Dr. Osei-Bonsu  [address] [place] or [address], [place] [phone] Phone number for both [place] and [place] locations is the same.  [facility] [address] [place] [phone]   [facility] [address] [place] or [address] [phone]   [facility] [address], [place] [phone], phone; [phone], fax Sees patients 1st and 3rd Saturday of every month.  Must not qualify for public or private insurance (i.e. Medicaid, Medicare, [place] Health Choice, Veterans' Benefits)  Household income should be no  more than 200% of the poverty level The clinic cannot treat you if you are pregnant or think you are pregnant  Sexually transmitted diseases are not treated at the clinic.    Dental Care: Organization         Address  Phone  Notes  [facility] [address] [place], [place] [phone] Accepts children up to age 15 who are enrolled in [place] or [place] Health Choice; pregnant women with a Medicaid card; and children who have applied for Medicaid or  Health Choice, but were declined, whose parents can pay a reduced fee at time of service.  [facility]  [address], [place] [phone] Accepts children up  to age 7 who are enrolled in Medicaid or [place] Health Choice; pregnant women with a Medicaid card; and children who have applied for Medicaid or [place] Health Choice, but were declined, whose parents can pay a reduced fee at time of service.  [facility]  [address] [place], [place] [phone] Patients are seen by appointment only. Walk-ins are not accepted. [facility] will see patients 53 years of age and older. Monday - Tuesday (8am-5pm) Most Wednesdays (8:30-5pm) $30 per visit, cash only  [facility]  [address], [facility] [phone] Patients are seen by appointment only. Walk-ins are not accepted. [facility] will see patients 18 years of age and older. One Wednesday Evening (Monthly: Volunteer Based).  $30 per visit, cash only  [employer]  [phone] for adults; Children under age 15, call Graduate Pediatric Dentistry at [phone]. Children aged 49-14, please call [phone] to request a pediatric application.  Dental services are provided in all areas of dental care including fillings, crowns and bridges, complete and partial dentures, implants, gum treatment, root canals,  and extractions. Preventive care is also provided. Treatment is provided to both adults and children. Patients are selected via a lottery and there is often a waiting list.   [facility] [address], [place]  [phone] [email or web address]   [facility] [address] [place], [place] [phone], Ext. [phone] Second and Fourth Thursday of each month, opens at 6:30 AM; Clinic ends at 9 AM.  Patients are seen on a first-come first-served basis, and a limited number are seen during each clinic.   [facility]  [address] Ether Griffins [place], [place] [phone]   Eligibility Requirements You must have lived in [place], [place], or [place] counties for at least the last three months.   You cannot be eligible for state or federal sponsored [place], including [employer], [place], or [employer].   You generally cannot be eligible for healthcare insurance through your employer.    How to apply: Eligibility screenings are held every Tuesday and Wednesday afternoon from 1:00 pm until 4:00 pm. You do not need an appointment for the interview!  [facility] [address], [place], [place] [phone]   [facility] Health Department  [phone]   [facility] Health Department  [phone]   [facility] Health Department  [phone]    Behavioral Health Resources in the Community: Intensive Outpatient Programs Organization         Address  Phone  Notes  [facility] [address], [place], [place] [phone]   [facility] Outpatient [address], [place], [place] [phone]   [facility] [address] [place], [place]  [phone]   [facility] [address],  [place], [place] [phone] or [phone]   Substance Abuse Resources Organization         Address  Phone  Notes  [facility]  [phone]    [facility]  [phone]   [facility]  [phone]   Floydene Flock  [phone]   Residential & Outpatient Substance Abuse Program  [phone]   Psychological Services Organization         Address  Phone  Notes  [facility] Behavioral Health  [phone]   [facility]  [phone]   [facility]  [address] [place] [phone] or [phone]    Mobile Crisis Teams Organization         Address  Phone  Notes  [facility], Mobile Crisis Care Unit  [phone]   [facility]  [address] [place], [place] [phone]   Doristine Locks [address] [place] [phone]    Self-Help/Support Groups Organization         Address  Phone             Notes  Mental Health Assoc. of  - variety of support groups  336- I7437963 Call for more information  Narcotics Anonymous (NA), [facility] [address], [employer] [place]  2 meetings at this location   Statistician         Address  Phone  Notes  [facility] [address],    [place]  [phone]   [facility]  [address] [place] [address], [place], [place] [phone]   [facility] [address] [place], [place] [phone] Admissions: 8am-3pm M-F  [facility] [address],    [place], [place] [phone]   [facility] [address] [place], [place], [place] [phone]   [facility] [address]  [place], [place] [phone]   [facility] - Intensive Outpatient [address] Laurell Josephs [address], [place], [place] [phone]   [facility] ([facility]iction [facility] [address]  [place], [place] [phone] or [phone]   [facility] ([facility]) [address], [place], [place] [phone] Accepts Medicaid  [facility] [address],    [place] [phone] Substance Abuse/Addiction Treatment   [facility] Organization         Address  Phone  Notes  [facility]  [phone]   Angie Fava, PhD [address] Ervin Knack [place], [place]   [phone] or [phone]   [facility] Behavioral   [address] [place], [place] [phone]   [facility] [address] [place], [place] [phone] Insurance/Medicaid/sponsorship through [facility] and Families [address]                                    [place], [place] [phone] Therapy/tele-psych/case  [facility] [address], [place] [phone]    Dr. Lolly Mustache  [phone]   [facility] [place]  [employer] [facility] Dept. 1) [address] [place] 2) [address] [place] 3)  [address], [place] [phone]  [phone]   [facility] Child Abuse Hotline 302-579-3083 or [phone] (After Hours)

## 2013-10-25 NOTE — BH Assessment (Signed)
Per Julie Sanford Psychiatric extender reports that he consulted with Julie Sanford who is recommending that patient be admitted to 23 observation unit for monitoring of seizure and treatment overnight. Spoke with Julie Sanford and informed him that there has been a change in disposition plan  and d/c not recommended at this time. Patient will be admitted to observation unit. ED nurse Julie Sanford has been notified of this update.   Julie Peach, MS, LCASA Assessment Counselor

## 2013-10-25 NOTE — BH Assessment (Signed)
Spoke with pt's ED nurse to inform her that Conrad,NP will complete a Tele-psych on patient between 12pm-1pm.   [name], MS, LCASA Assessment Counselor

## 2016-08-24 ENCOUNTER — Inpatient Hospital Stay (HOSPITAL_COMMUNITY)
Admission: EM | Admit: 2016-08-24 | Discharge: 2016-08-28 | DRG: 558 | Disposition: A | Discharge status: Home / Self Care | Payer: Medicaid Other | Attending: Internal Medicine | Admitting: Internal Medicine

## 2016-08-24 ENCOUNTER — Encounter (HOSPITAL_COMMUNITY): Payer: Self-pay | Admitting: Emergency Medicine

## 2016-08-24 ENCOUNTER — Emergency Department (HOSPITAL_COMMUNITY): Payer: Medicaid Other

## 2016-08-24 DIAGNOSIS — S8001XD Contusion of right knee, subsequent encounter: Secondary | ICD-10-CM

## 2016-08-24 DIAGNOSIS — W01198D Fall on same level from slipping, tripping and stumbling with subsequent striking against other object, subsequent encounter: Secondary | ICD-10-CM | POA: Diagnosis present

## 2016-08-24 DIAGNOSIS — M00861 Arthritis due to other bacteria, right knee: Secondary | ICD-10-CM | POA: Diagnosis not present

## 2016-08-24 DIAGNOSIS — F419 Anxiety disorder, unspecified: Secondary | ICD-10-CM | POA: Diagnosis present

## 2016-08-24 DIAGNOSIS — B009 Herpesviral infection, unspecified: Secondary | ICD-10-CM | POA: Diagnosis present

## 2016-08-24 DIAGNOSIS — L03115 Cellulitis of right lower limb: Secondary | ICD-10-CM

## 2016-08-24 DIAGNOSIS — F1910 Other psychoactive substance abuse, uncomplicated: Secondary | ICD-10-CM | POA: Diagnosis present

## 2016-08-24 DIAGNOSIS — R569 Unspecified convulsions: Secondary | ICD-10-CM | POA: Diagnosis not present

## 2016-08-24 DIAGNOSIS — Z79899 Other long term (current) drug therapy: Secondary | ICD-10-CM | POA: Diagnosis not present

## 2016-08-24 DIAGNOSIS — D509 Iron deficiency anemia, unspecified: Secondary | ICD-10-CM | POA: Diagnosis present

## 2016-08-24 DIAGNOSIS — Z22322 Carrier or suspected carrier of Methicillin resistant Staphylococcus aureus: Secondary | ICD-10-CM | POA: Diagnosis not present

## 2016-08-24 DIAGNOSIS — G40909 Epilepsy, unspecified, not intractable, without status epilepticus: Secondary | ICD-10-CM | POA: Diagnosis present

## 2016-08-24 DIAGNOSIS — M009 Pyogenic arthritis, unspecified: Secondary | ICD-10-CM

## 2016-08-24 DIAGNOSIS — Z880 Allergy status to penicillin: Secondary | ICD-10-CM | POA: Diagnosis not present

## 2016-08-24 DIAGNOSIS — Z885 Allergy status to narcotic agent status: Secondary | ICD-10-CM

## 2016-08-24 DIAGNOSIS — A044 Other intestinal Escherichia coli infections: Secondary | ICD-10-CM | POA: Diagnosis present

## 2016-08-24 DIAGNOSIS — Z888 Allergy status to other drugs, medicaments and biological substances status: Secondary | ICD-10-CM

## 2016-08-24 DIAGNOSIS — Z8614 Personal history of Methicillin resistant Staphylococcus aureus infection: Secondary | ICD-10-CM | POA: Diagnosis not present

## 2016-08-24 DIAGNOSIS — Z833 Family history of diabetes mellitus: Secondary | ICD-10-CM

## 2016-08-24 DIAGNOSIS — B9562 Methicillin resistant Staphylococcus aureus infection as the cause of diseases classified elsewhere: Secondary | ICD-10-CM | POA: Diagnosis present

## 2016-08-24 DIAGNOSIS — M7041 Prepatellar bursitis, right knee: Principal | ICD-10-CM | POA: Diagnosis present

## 2016-08-24 DIAGNOSIS — F319 Bipolar disorder, unspecified: Secondary | ICD-10-CM | POA: Diagnosis present

## 2016-08-24 DIAGNOSIS — G8929 Other chronic pain: Secondary | ICD-10-CM | POA: Diagnosis present

## 2016-08-24 DIAGNOSIS — L0889 Other specified local infections of the skin and subcutaneous tissue: Secondary | ICD-10-CM | POA: Diagnosis present

## 2016-08-24 DIAGNOSIS — F17210 Nicotine dependence, cigarettes, uncomplicated: Secondary | ICD-10-CM | POA: Diagnosis present

## 2016-08-24 DIAGNOSIS — M25561 Pain in right knee: Secondary | ICD-10-CM | POA: Diagnosis present

## 2016-08-24 LAB — CBC WITH DIFFERENTIAL/PLATELET
Basophils Absolute: 0 10*3/uL (ref 0.0–0.1)
Basophils Relative: 0 %
EOS ABS: 0.1 10*3/uL (ref 0.0–0.7)
Eosinophils Relative: 1 %
HEMATOCRIT: 29.6 % — AB (ref 36.0–46.0)
Hemoglobin: 9.4 g/dL — ABNORMAL LOW (ref 12.0–15.0)
LYMPHS PCT: 19 %
Lymphs Abs: 1.7 10*3/uL (ref 0.7–4.0)
MCH: 21.5 pg — ABNORMAL LOW (ref 26.0–34.0)
MCHC: 31.8 g/dL (ref 30.0–36.0)
MCV: 67.6 fL — ABNORMAL LOW (ref 78.0–100.0)
MONOS PCT: 6 %
Monocytes Absolute: 0.5 10*3/uL (ref 0.1–1.0)
Neutro Abs: 6.5 10*3/uL (ref 1.7–7.7)
Neutrophils Relative %: 74 %
Platelets: 222 10*3/uL (ref 150–400)
RBC: 4.38 MIL/uL (ref 3.87–5.11)
RDW: 19.9 % — ABNORMAL HIGH (ref 11.5–15.5)
WBC: 8.8 10*3/uL (ref 4.0–10.5)

## 2016-08-24 LAB — GASTROINTESTINAL PANEL BY PCR, STOOL (REPLACES STOOL CULTURE)
ADENOVIRUS F40/41: NOT DETECTED
ASTROVIRUS: NOT DETECTED
CRYPTOSPORIDIUM: NOT DETECTED
CYCLOSPORA CAYETANENSIS: NOT DETECTED
Campylobacter species: NOT DETECTED
ENTEROAGGREGATIVE E COLI (EAEC): NOT DETECTED
ENTEROPATHOGENIC E COLI (EPEC): DETECTED — AB
ENTEROTOXIGENIC E COLI (ETEC): NOT DETECTED
Entamoeba histolytica: NOT DETECTED
GIARDIA LAMBLIA: NOT DETECTED
Norovirus GI/GII: NOT DETECTED
Plesimonas shigelloides: NOT DETECTED
Rotavirus A: NOT DETECTED
Salmonella species: NOT DETECTED
Sapovirus (I, II, IV, and V): NOT DETECTED
Shiga like toxin producing E coli (STEC): NOT DETECTED
Shigella/Enteroinvasive E coli (EIEC): NOT DETECTED
VIBRIO CHOLERAE: NOT DETECTED
VIBRIO SPECIES: NOT DETECTED
YERSINIA ENTEROCOLITICA: NOT DETECTED

## 2016-08-24 LAB — LACTIC ACID, PLASMA
Lactic Acid, Venous: 0.8 mmol/L (ref 0.5–1.9)
Lactic Acid, Venous: 0.6 mmol/L (ref 0.5–1.9)

## 2016-08-24 LAB — SYNOVIAL CELL COUNT + DIFF, W/ CRYSTALS
Crystals, Fluid: NONE SEEN
Lymphocytes-Synovial Fld: 5 % (ref 0–20)
Monocyte-Macrophage-Synovial Fluid: 10 % — ABNORMAL LOW (ref 50–90)
NEUTROPHIL, SYNOVIAL: 85 % — AB (ref 0–25)
WBC, SYNOVIAL: 410 /mm3 — AB (ref 0–200)

## 2016-08-24 LAB — COMPREHENSIVE METABOLIC PANEL
ALBUMIN: 3.3 g/dL — AB (ref 3.5–5.0)
ALT: 33 U/L (ref 14–54)
AST: 34 U/L (ref 15–41)
Alkaline Phosphatase: 67 U/L (ref 38–126)
Anion gap: 8 (ref 5–15)
BILIRUBIN TOTAL: 0.4 mg/dL (ref 0.3–1.2)
BUN: 22 mg/dL — AB (ref 6–20)
CALCIUM: 8.5 mg/dL — AB (ref 8.9–10.3)
CO2: 33 mmol/L — ABNORMAL HIGH (ref 22–32)
Chloride: 96 mmol/L — ABNORMAL LOW (ref 101–111)
Creatinine, Ser: 0.89 mg/dL (ref 0.44–1.00)
GFR calc Af Amer: >60 mL/min (ref >60)
GFR calc non Af Amer: >60 mL/min (ref >60)
Glucose, Bld: 109 mg/dL — ABNORMAL HIGH (ref 65–99)
Potassium: 3.9 mmol/L (ref 3.5–5.1)
Sodium: 137 mmol/L (ref 135–145)
TOTAL PROTEIN: 7.4 g/dL (ref 6.5–8.1)

## 2016-08-24 MED ORDER — GABAPENTIN 300 MG PO CAPS
600.0000 mg | ORAL_CAPSULE | Freq: Three times a day (TID) | ORAL | Status: DC
  Administered 2016-08-24 – 2016-08-28 (×16): 600 mg via ORAL
  Filled 2016-08-24 (×14): qty 2
  Filled 2016-08-24: qty 6
  Filled 2016-08-24 (×2): qty 2

## 2016-08-24 MED ORDER — TRAZODONE HCL 100 MG PO TABS
400.0000 mg | ORAL_TABLET | Freq: Every day | ORAL | Status: DC
  Administered 2016-08-24 – 2016-08-27 (×4): 400 mg via ORAL
  Filled 2016-08-24 (×4): qty 4

## 2016-08-24 MED ORDER — HYDROXYZINE PAMOATE 50 MG PO CAPS
50.0000 mg | ORAL_CAPSULE | Freq: Three times a day (TID) | ORAL | Status: DC | PRN
  Filled 2016-08-24: qty 2

## 2016-08-24 MED ORDER — HYDROCODONE-ACETAMINOPHEN 5-325 MG PO TABS
1.0000 | ORAL_TABLET | ORAL | Status: DC | PRN
  Administered 2016-08-24 – 2016-08-26 (×5): 1 via ORAL
  Administered 2016-08-27: 2 via ORAL
  Administered 2016-08-27 (×3): 1 via ORAL
  Administered 2016-08-28 (×3): 2 via ORAL
  Filled 2016-08-24: qty 2
  Filled 2016-08-24: qty 1
  Filled 2016-08-24: qty 2
  Filled 2016-08-24 (×6): qty 1
  Filled 2016-08-24: qty 2
  Filled 2016-08-24: qty 1
  Filled 2016-08-24: qty 2

## 2016-08-24 MED ORDER — LIDOCAINE HCL 1 % IJ SOLN
20.0000 mL | Freq: Once | INTRAMUSCULAR | Status: AC
  Administered 2016-08-24: 20 mL

## 2016-08-24 MED ORDER — SERTRALINE HCL 100 MG PO TABS
100.0000 mg | ORAL_TABLET | Freq: Every day | ORAL | Status: DC
  Administered 2016-08-24 – 2016-08-28 (×5): 100 mg via ORAL
  Filled 2016-08-24 (×5): qty 1

## 2016-08-24 MED ORDER — BUPRENORPHINE HCL 8 MG SL SUBL: 8.0000 mg | SUBLINGUAL_TABLET | Freq: Every day | SUBLINGUAL | Status: DC

## 2016-08-24 MED ORDER — METHOCARBAMOL 500 MG PO TABS
1500.0000 mg | ORAL_TABLET | Freq: Four times a day (QID) | ORAL | Status: DC | PRN
  Administered 2016-08-27: 1500 mg via ORAL
  Filled 2016-08-24: qty 3

## 2016-08-24 MED ORDER — KETOROLAC TROMETHAMINE 30 MG/ML IJ SOLN
15.0000 mg | Freq: Once | INTRAMUSCULAR | Status: AC
  Administered 2016-08-24: 15 mg via INTRAVENOUS
  Filled 2016-08-24: qty 1

## 2016-08-24 MED ORDER — ENOXAPARIN SODIUM 40 MG/0.4ML ~~LOC~~ SOLN
40.0000 mg | SUBCUTANEOUS | Status: DC
  Administered 2016-08-24 – 2016-08-26 (×3): 40 mg via SUBCUTANEOUS
  Filled 2016-08-24 (×3): qty 0.4

## 2016-08-24 MED ORDER — VANCOMYCIN HCL 500 MG IV SOLR
500.0000 mg | Freq: Three times a day (TID) | INTRAVENOUS | Status: DC
  Administered 2016-08-24 – 2016-08-26 (×5): 500 mg via INTRAVENOUS
  Filled 2016-08-24 (×6): qty 500

## 2016-08-24 MED ORDER — VANCOMYCIN HCL IN DEXTROSE 1-5 GM/200ML-% IV SOLN
1000.0000 mg | Freq: Once | INTRAVENOUS | Status: AC
  Administered 2016-08-24: 1000 mg via INTRAVENOUS
  Filled 2016-08-24: qty 200

## 2016-08-24 MED ORDER — LIDOCAINE HCL (PF) 1 % IJ SOLN
30.0000 mL | Freq: Once | INTRAMUSCULAR | Status: DC
  Filled 2016-08-24: qty 30

## 2016-08-24 MED ORDER — OXYCODONE-ACETAMINOPHEN 5-325 MG PO TABS
2.0000 | ORAL_TABLET | Freq: Once | ORAL | Status: AC
  Administered 2016-08-24: 2 via ORAL
  Filled 2016-08-24: qty 2

## 2016-08-24 MED ORDER — LIDOCAINE HCL (PF) 1 % IJ SOLN
20.0000 mL | Freq: Once | INTRAMUSCULAR | Status: DC
  Filled 2016-08-24: qty 20

## 2016-08-24 MED ORDER — ACETAMINOPHEN 650 MG RE SUPP: 650.0000 mg | Freq: Four times a day (QID) | RECTAL | Status: DC | PRN

## 2016-08-24 MED ORDER — BUPRENORPHINE HCL-NALOXONE HCL 8-2 MG SL SUBL: 1.0000 | SUBLINGUAL_TABLET | Freq: Every day | SUBLINGUAL | Status: DC

## 2016-08-24 MED ORDER — ACETAMINOPHEN 325 MG PO TABS: 650.0000 mg | ORAL_TABLET | Freq: Four times a day (QID) | ORAL | Status: DC | PRN

## 2016-08-24 MED ORDER — LURASIDONE HCL 40 MG PO TABS
80.0000 mg | ORAL_TABLET | Freq: Every day | ORAL | Status: DC
  Administered 2016-08-24 – 2016-08-27 (×4): 80 mg via ORAL
  Filled 2016-08-24 (×5): qty 2

## 2016-08-24 MED ORDER — LEVETIRACETAM 500 MG PO TABS
1000.0000 mg | ORAL_TABLET | Freq: Two times a day (BID) | ORAL | Status: DC
  Administered 2016-08-24 – 2016-08-28 (×9): 1000 mg via ORAL
  Filled 2016-08-24 (×9): qty 2

## 2016-08-24 MED ORDER — KETOROLAC TROMETHAMINE 30 MG/ML IJ SOLN
30.0000 mg | Freq: Four times a day (QID) | INTRAMUSCULAR | Status: DC | PRN
  Administered 2016-08-25 – 2016-08-28 (×6): 30 mg via INTRAVENOUS
  Filled 2016-08-24 (×6): qty 1

## 2016-08-24 MED ORDER — BUPRENORPHINE HCL 8 MG SL SUBL
8.0000 mg | SUBLINGUAL_TABLET | Freq: Three times a day (TID) | SUBLINGUAL | Status: DC
  Administered 2016-08-24 – 2016-08-28 (×13): 8 mg via SUBLINGUAL
  Filled 2016-08-24 (×12): qty 1

## 2016-08-24 MED ORDER — SODIUM CHLORIDE 0.9 % IV BOLUS (SEPSIS)
1000.0000 mL | Freq: Once | INTRAVENOUS | Status: AC
  Administered 2016-08-24: 1000 mL via INTRAVENOUS

## 2016-08-24 MED ORDER — CLONAZEPAM 0.5 MG PO TABS
0.2500 mg | ORAL_TABLET | Freq: Three times a day (TID) | ORAL | Status: DC
  Administered 2016-08-24 – 2016-08-28 (×13): 0.25 mg via ORAL
  Filled 2016-08-24 (×13): qty 1

## 2016-08-24 MED ORDER — BACLOFEN 10 MG PO TABS
10.0000 mg | ORAL_TABLET | Freq: Three times a day (TID) | ORAL | Status: DC
  Administered 2016-08-24 – 2016-08-28 (×13): 10 mg via ORAL
  Filled 2016-08-24 (×12): qty 1

## 2016-08-24 NOTE — Progress Notes (Signed)
I have reviewed the synovial fluid analysis and gram stain. She has an inflammatory effusion but based on early results it does not appear infectious in etiology. Continue to treat the bursitis with antibiotics and we will follow clinically to make sure she is improving and determine any potential surgical intervention

## 2016-08-24 NOTE — ED Triage Notes (Signed)
Pt brought in by [facility] from home with c/o right knee pain  Pt states she fell a while back and fractured her knee cap  Pt states in the past week her right knee has become red, swollen, and very painful  Pt also c/o pain to her right thumb that has an open wound with swelling noted

## 2016-08-24 NOTE — ED Provider Notes (Signed)
Patient reports she fell going outside to play for children about 5 days ago. She states she fell onto the ground. She states [facility] was told she "broke your knee". And was placed in crutches and started on ibuprofen. She states she did not have any follow-up. She states her knee is getting progressively or painful red and swollen.  Patient's right knee is markedly swollen compared to left with intense redness of the whole knee that extends to the right posterior calf and the right medial thigh. It is warm to touch. There are no lesions in the skin to suggest abrasions.     Medical screening examination/treatment/procedure(s) were conducted as a shared visit with non-physician practitioner(s) and myself.  I personally evaluated the patient during the encounter.   EKG Interpretation None       Devoria AlbeIva Rhapsody Wolven, MD, Concha PyoFACEP   Shelli Portilla, MD 08/24/16 [phone]

## 2016-08-24 NOTE — H&P (Signed)
History and Physical    JEVAEH SHAMS QIO:[id number] DOB: 1980-03-06 DOA: 08/24/2016  PCP: Default, Provider, MD  Patient coming from: Home  Chief Complaint: Right knee swelling and pain   HPI: Julie Sanford is a 36 y.o. female with medical history significant of bipolar disorder, seizures, drug abuse , who presents with chief complaint of right knee swelling and pain. She states that about 5 days ago, she tripped and fell in her driveway landing on her right knee. She was evaluated at [facility] ED and was apparently told that she had an right knee fracture, sent home with ibuprofen and crutches. She states that 3 days ago, her right knee swelling and redness as well as pain has significantly worsened. Denies fever, chills, chest pain, SOB, cough, nausea, vomiting. Has had about 6 days of diarrhea.   ED Course: Xray obtained which showed no acute osseous pathology but did show diffuse soft tissue swelling of the anterior knee with probable small suprapatellar effusion. Patient started on vancomycin for suspected cellulitis overlying possible septic joint.   Review of Systems: As per HPI otherwise 10 point review of systems negative.   Past Medical History:  Diagnosis Date  . Bipolar 1 disorder (HCC)   . Depression   . Herpes   . Seizures (HCC)     Past Surgical History:  Procedure Laterality Date  . CESAREAN SECTION       reports that she has been smoking Cigarettes.  She has been smoking about 2.00 packs per day. She has never used smokeless tobacco. She reports that she does not drink alcohol or use drugs.  Allergies  Allergen Reactions  . Quetiapine Other (See Comments)    Severe muscle spasms and hallucinations  . Tramadol Other (See Comments)    Muscle spasms, seizure and hallucinations  . Penicillins     Family History  Problem Relation Age of Onset  . Diabetes Sister     Prior to Admission medications   Medication Sig Start Date End Date Taking?  Authorizing Provider  baclofen (LIORESAL) 10 MG tablet Take 10 mg by mouth 3 (three) times daily. 07/31/16  Yes [provider]  clonazePAM (KLONOPIN) 0.5 MG tablet Take 0.25 mg by mouth every 8 (eight) hours. 08/17/16  Yes [provider]  gabapentin (NEURONTIN) 300 MG capsule Take 600 mg by mouth 4 (four) times daily - after meals and at bedtime. 08/15/16  Yes [provider]  hydrOXYzine (VISTARIL) 50 MG capsule Take 50-100 mg by mouth 3 (three) times daily as needed for anxiety or itching.  07/13/16  Yes [provider]  ipratropium-albuterol (DUONEB) 0.5-2.5 (3) MG/3ML SOLN Take 3 mLs by nebulization every 6 (six) hours as needed (sob, wheezing).  08/08/16  Yes [provider]  LATUDA 80 MG TABS tablet Take 80 mg by mouth at bedtime. 07/13/16  Yes [provider]  levETIRAcetam (KEPPRA) 500 MG tablet Take 1,000 mg by mouth 2 (two) times daily. 08/16/16  Yes [provider]  methocarbamol (ROBAXIN) 750 MG tablet Take 1,500 mg by mouth 4 (four) times daily as needed for muscle spasms.  08/10/16  Yes [provider]  PROAIR HFA 108 (90 Base) MCG/ACT inhaler Inhale 2 puffs into the lungs every 6 (six) hours as needed for wheezing or shortness of breath.  08/19/16  Yes [provider]  sertraline (ZOLOFT) 100 MG tablet Take 100 mg by mouth daily. 07/31/16  Yes [provider]  SUBOXONE 8-2 MG FILM Place 1  Film under the tongue 3 (three) times daily. 08/21/16  Yes [provider]  traZODone (DESYREL) 100 MG tablet Take 400 mg by mouth at bedtime. 08/13/16  Yes [provider]    Physical Exam: Vitals:   08/24/16 0419 08/24/16 0433  BP:  113/82  Pulse:  (!) 102  Resp:  16  Temp:  98.2 F (36.8 C)  TempSrc:  Oral  SpO2: 94% 92%    Constitutional: NAD, anxious and tearful, very thin  Eyes: PERRL, lids and conjunctivae normal ENMT: Mucous membranes are moist. Posterior pharynx clear of any exudate or  lesions. Neck: normal, supple, no masses, no thyromegaly Respiratory: clear to auscultation bilaterally, no wheezing, no crackles. Normal respiratory effort. No accessory muscle use.  Cardiovascular: Tachycardic rate and regular rhythm, no murmurs / rubs / gallops.  Abdomen: no tenderness, no masses palpated. No hepatosplenomegaly. Bowel sounds positive.  Musculoskeletal: Right knee with large area of erythema extending both proximally and distally, with edema, and tenderness of palpation  Skin: +erythema over right knee  Neurologic: CN 2-12 grossly intact. Strength limited in RLE due to pain  Psychiatric: Normal judgment and insight. Alert and oriented x 3. Normal mood.   Labs on Admission: I have personally reviewed following labs and imaging studies  CBC:  Recent Labs Lab 08/24/16 0605  WBC 8.8  NEUTROABS 6.5  HGB 9.4*  HCT 29.6*  MCV 67.6*  PLT 222   Basic Metabolic Panel:  Recent Labs Lab 08/24/16 0605  NA 137  K 3.9  CL 96*  CO2 33*  GLUCOSE 109*  BUN 22*  CREATININE 0.89  CALCIUM 8.5*   GFR: CrCl cannot be calculated (Unknown ideal weight.). Liver Function Tests:  Recent Labs Lab 08/24/16 0605  AST 34  ALT 33  ALKPHOS 67  BILITOT 0.4  PROT 7.4  ALBUMIN 3.3*   No results for input(s): LIPASE, AMYLASE in the last 168 hours. No results for input(s): AMMONIA in the last 168 hours. Coagulation Profile: No results for input(s): INR, PROTIME in the last 168 hours. Cardiac Enzymes: No results for input(s): CKTOTAL, CKMB, CKMBINDEX, TROPONINI in the last 168 hours. BNP (last 3 results) No results for input(s): PROBNP in the last 8760 hours. HbA1C: No results for input(s): HGBA1C in the last 72 hours. CBG: No results for input(s): GLUCAP in the last 168 hours. Lipid Profile: No results for input(s): CHOL, HDL, LDLCALC, TRIG, CHOLHDL, LDLDIRECT in the last 72 hours. Thyroid Function Tests: No results for input(s): TSH, T4TOTAL, FREET4, T3FREE, THYROIDAB  in the last 72 hours. Anemia Panel: No results for input(s): VITAMINB12, FOLATE, FERRITIN, TIBC, IRON, RETICCTPCT in the last 72 hours. Urine analysis:    Component Value Date/Time   COLORURINE YELLOW 10/24/2013 0120   APPEARANCEUR CLEAR 10/24/2013 0120   LABSPEC 1.005 10/24/2013 0120   PHURINE 6.5 10/24/2013 0120   GLUCOSEU NEGATIVE 10/24/2013 0120   HGBUR NEGATIVE 10/24/2013 0120   BILIRUBINUR NEGATIVE 10/24/2013 0120   KETONESUR NEGATIVE 10/24/2013 0120   PROTEINUR NEGATIVE 10/24/2013 0120   UROBILINOGEN 0.2 10/24/2013 0120   NITRITE NEGATIVE 10/24/2013 0120   LEUKOCYTESUR NEGATIVE 10/24/2013 0120   Sepsis Labs: !!!!!!!!!!!!!!!!!!!!!!!!!!!!!!!!!!!!!!!!!!!! @LABRCNTIP (procalcitonin:4,lacticidven:4) )No results found for this or any previous visit (from the past 240 hour(s)).   Radiological Exams on Admission: Dg Knee Complete 4 Views Right  Result Date: 08/24/2016 CLINICAL DATA:  36 year old female with fall and right knee pain. EXAM: RIGHT KNEE - COMPLETE 4+ VIEW COMPARISON:  Radiograph dated 08/16/2016 FINDINGS: There is no acute fracture  or dislocation. The bones are well mineralized. No arthritic changes. Small suprapatellar effusion. There is diffuse soft tissue swelling over the anterior knee, new compared to the prior study. Correlation with clinical exam is recommended to evaluate for an infectious process. IMPRESSION: 1. No acute osseous pathology. 2. Diffuse soft tissue swelling of the anterior knee with probable small suprapatellar effusion. Clinical correlation is recommended to evaluate for an infectious process. Electronically Signed   By: Elgie Collard M.D.   On: 08/24/2016 05:56    Assessment/Plan Active Problems:   Septic arthritis of knee, right (HCC)   Sepsis secondary to right knee cellulitis with concern for septic joint  -Xray with diffuse soft tissue swelling of the anterior knee with probable small suprapatellar effusion.  -Started on vanco, has PCN  allergy -Check blood culture -Ortho consulted for possible arthrocentesis  Hx drug abuse -Obtain UDS -Patient states she has been clean for 6 months  -Continue home subutex   Microcytic anemia -Obtain iron studies   Diarrhea -Obtain GI PCR   Chronic pain -Continue home baclofen, subutex, robaxin   Bipolar -Continue klonopin, zoloft   Seizure disorder -Continue keppra    DVT prophylaxis: lovenox  Code Status: Full  Family Communication: No family at bedside Disposition Plan: pending improvement Consults called: Orthopedic surgery   Severity of Illness: The appropriate patient status for this patient is INPATIENT. Inpatient status is judged to be reasonable and necessary in order to provide the required intensity of service to ensure the patient's safety. The patient's presenting symptoms, physical exam findings, and initial radiographic and laboratory data in the context of their chronic comorbidities is felt to place them at high risk for further clinical deterioration. Furthermore, it is not anticipated that the patient will be medically stable for discharge from the hospital within 2 midnights of admission. The following factors support the patient status of inpatient.   " The patient's presenting symptoms include right knee pain and swelling. " The worrisome physical exam findings include right knee cellulitis, possible septic joint. " The initial radiographic and laboratory data are worrisome because of joint effusion on xray. " The chronic co-morbidities include drug abuse, seizure, bipolar, anemia.   * I certify that at the point of admission it is my clinical judgment that the patient will require inpatient hospital care spanning beyond 2 midnights from the point of admission due to high intensity of service, high risk for further deterioration and high frequency of surveillance required.*   Noralee Stain, DO [facility] [email or web address] Password  [facility] 08/24/2016, 8:48 AM

## 2016-08-24 NOTE — Progress Notes (Signed)
Patient inquired about antidiarrhea medicine and old finger wound on right hand.  Dr. Alvino Chapelhoi stated that GI cultures need to be finalized prior to giving medication for diarrhea, Wound consult placed for right finger, Dr. Alvino Chapelhoi stated if it came back as MRSA the patient is already covered by Vancomycin.

## 2016-08-24 NOTE — ED Provider Notes (Signed)
[facility]-EMERGENCY DEPT Provider Note   CSN: [id number] Arrival date & time: 08/24/16  0416     History   Chief Complaint Chief Complaint  Patient presents with  . Knee Pain    HPI Julie Sanford is a 36 y.o. female.  The patient presents with complaint of progressive right knee pain and swelling over the last 5 days since a fall. She reports being [facility] and being told she broke the bone in the knee. She also reports she was sent home on ibuprofen and crutches without a brace or orthopedic follow up. The knee has become increasingly red, swollen and painful through the week. No fever, nausea or vomiting. She admits to previous IVDA but reports she is not using now. She takes suboxone daily.   The history is provided by the patient. No language interpreter was used.    Past Medical History:  Diagnosis Date  . Bipolar 1 disorder (HCC)   . Depression   . Herpes   . Seizures [facility](HCC)     Patient Active Problem List   Diagnosis Date Noted  . Abnormal CBC 04/29/2011  . PTSD (post-traumatic stress disorder) 04/25/2011    Class: Chronic  . 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 Anemia 04/23/2011  . Cocaine abuse 04/23/2011    Past Surgical History:  Procedure Laterality Date  . CESAREAN SECTION      OB History    No data available       Home Medications    Prior to Admission medications   Medication Sig Start Date End Date Taking? Authorizing Provider  baclofen (LIORESAL) 10 MG tablet Take 10 mg by mouth 3 (three) times daily. 07/31/16  Yes [provider]  clonazePAM (KLONOPIN) 0.5 MG tablet Take 0.25 mg by mouth every 8 (eight) hours. 08/17/16  Yes [provider]  gabapentin (NEURONTIN) 300 MG capsule Take 600 mg by mouth 4 (four) times daily - after meals and at bedtime. 08/15/16  Yes [provider]  hydrOXYzine (VISTARIL) 50 MG capsule Take 50-100 mg by mouth 3 (three) times daily as needed for anxiety or itching.  07/13/16   Yes [provider]  ipratropium-albuterol (DUONEB) 0.5-2.5 (3) MG/3ML SOLN Take 3 mLs by nebulization every 6 (six) hours as needed (sob, wheezing).  08/08/16  Yes [provider]  LATUDA 80 MG TABS tablet Take 80 mg by mouth at bedtime. 07/13/16  Yes [provider]  levETIRAcetam (KEPPRA) 500 MG tablet Take 1,000 mg by mouth 2 (two) times daily. 08/16/16  Yes [provider]  methocarbamol (ROBAXIN) 750 MG tablet Take 1,500 mg by mouth 4 (four) times daily as needed for muscle spasms.  08/10/16  Yes [provider]  PROAIR HFA 108 (90 Base) MCG/ACT inhaler Inhale 2 puffs into the lungs every 6 (six) hours as needed for wheezing or shortness of breath.  08/19/16  Yes [provider]  sertraline (ZOLOFT) 100 MG tablet Take 100 mg by mouth daily. 07/31/16  Yes [provider]  SUBOXONE 8-2 MG FILM Place 1 Film under the tongue 3 (three) times daily. 08/21/16  Yes [provider]  traZODone (DESYREL) 100 MG tablet Take 400 mg by mouth at bedtime. 08/13/16  Yes [provider]    Family History History reviewed. No pertinent family history.  Social History Social History  Substance Use Topics  . Smoking status: Current Every Day Smoker    Packs/day: 2.00    Types: Cigarettes  . Smokeless tobacco:  Never Used  . Alcohol use No     Allergies   Quetiapine; Tramadol; and Penicillins   Review of Systems Review of Systems  Constitutional: Positive for activity change. Negative for chills and fever.  Respiratory: Negative.   Cardiovascular: Negative.   Gastrointestinal: Negative.   Musculoskeletal:       See HPI.  Skin: Positive for color change.  Neurological: Negative.      Physical Exam Updated Vital Signs BP 113/82 (BP Location: Left Arm)   Pulse (!) 102   Temp 98.2 F (36.8 C) (Oral)   Resp 16   LMP 07/26/2016 (Approximate)   SpO2 92%   Physical Exam  Constitutional: She is oriented to person, place,  and time. She appears well-developed and well-nourished.  HENT:  Head: Normocephalic.  Neck: Normal range of motion. Neck supple.  Cardiovascular: Normal rate, regular rhythm and intact distal pulses.   Pulmonary/Chest: Effort normal and breath sounds normal.  Abdominal: Soft. Bowel sounds are normal. There is no tenderness. There is no rebound and no guarding.  Musculoskeletal:  Right knee has anterior swelling. It is significantly red, also anteriorly, and warm to touch. Redness extends distally and proximally from knee. She resists any movement.   Neurological: She is alert and oriented to person, place, and time.  Skin: Skin is warm and dry. No rash noted.  Psychiatric: She has a normal mood and affect.     ED Treatments / Results  Labs (all labs ordered are listed, but only abnormal results are displayed) Labs Reviewed  CBC WITH DIFFERENTIAL/PLATELET - Abnormal; Notable for the following:       Result Value   Hemoglobin 9.4 (*)    HCT 29.6 (*)    MCV 67.6 (*)    MCH 21.5 (*)    RDW 19.9 (*)    All other components within normal limits  COMPREHENSIVE METABOLIC PANEL - Abnormal; Notable for the following:    Chloride 96 (*)    CO2 33 (*)    Glucose, Bld 109 (*)    BUN 22 (*)    Calcium 8.5 (*)    Albumin 3.3 (*)    All other components within normal limits  LACTIC ACID, PLASMA  LACTIC ACID, PLASMA    EKG  EKG Interpretation None       Radiology Dg Knee Complete 4 Views Right  Result Date: 08/24/2016 CLINICAL DATA:  36 year old female with fall and right knee pain. EXAM: RIGHT KNEE - COMPLETE 4+ VIEW COMPARISON:  Radiograph dated 08/16/2016 FINDINGS: There is no acute fracture or dislocation. The bones are well mineralized. No arthritic changes. Small suprapatellar effusion. There is diffuse soft tissue swelling over the anterior knee, new compared to the prior study. Correlation with clinical exam is recommended to evaluate for an infectious process. IMPRESSION: 1.  No acute osseous pathology. 2. Diffuse soft tissue swelling of the anterior knee with probable small suprapatellar effusion. Clinical correlation is recommended to evaluate for an infectious process. Electronically Signed   By: Elgie Collard M.D.   On: 08/24/2016 05:56    Procedures Procedures (including critical care time)  Medications Ordered in ED Medications  sodium chloride 0.9 % bolus 1,000 mL (not administered)  vancomycin (VANCOCIN) IVPB 1000 mg/200 mL premix (1,000 mg Intravenous New Bag/Given 08/24/16 0703)  oxyCODONE-acetaminophen (PERCOCET/ROXICET) 5-325 MG per tablet 2 tablet (2 tablets Oral Given 08/24/16 0607)  ketorolac (TORADOL) 30 MG/ML injection 15 mg (15 mg Intravenous Given 08/24/16 0701)     Initial Impression /  Assessment and Plan / ED Course  I have reviewed the triage vital signs and the nursing notes.  Pertinent labs & imaging results that were available during my care of the patient were reviewed by me and considered in my medical decision making (see chart for details).     Patient presents with pain and swelling in right knee since fall 5 days ago.   Blood studies are reassuring. She has anemia with history of same. No leukocytosis. Knee appears cellulitic, question septic joint.   The patient continues picking her skin causing multiple ulceration in varying stages of healing. Suspect ongoing drug abuse.   She will need to be admitted for treatment with IV antibiotics. No IV narcotics offered.   Final Clinical Impressions(s) / ED Diagnoses   Final diagnoses:  None   1. Right knee cellulitis vs septic joint 2. History substance abuse  New Prescriptions New Prescriptions   No medications on file     Elpidio Anis, Cordelia Poche 08/24/16 0754    Devoria Albe, MD 08/24/16 [phone]

## 2016-08-24 NOTE — ED Notes (Signed)
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

## 2016-08-24 NOTE — ED Notes (Signed)
Unable to collect labs at this time patient states she is on the bedpan so come back later

## 2016-08-24 NOTE — Progress Notes (Signed)
Pharmacy Antibiotic Note  Julie FriesChristina M Sanford is a 36 y.o. female admitted on 08/24/2016 with cellulitis.  Pharmacy has been consulted for vancomycin dosing.  WBC WNL, Creat WNL, AF. Vanc 1 gm given at 07 am.  Pt with R knee swelling/contusion - r/o septic arthritis and bursitis.  Knee tapped by ortho and fluid sent for analysis.  If positive for infection will need arthroscopic I&D.  Per ortho she also has inflammed suprapatellar bursa which warrants IV abxs.  First dose of vanc given before knee was tapped. Wt 40.9 kg  Plan: Vancomycin 500 mg IV every 8 hours.  Goal trough 15-20 mcg/mL.  Once osteo is ruled out dose can be changed to 750 q12h for trough goal of 10 -15 mcg/ml F/u renal fxn, WBC, temp, culture data Vancomycin trough as needed   Temp (24hrs), Avg:98.2 F (36.8 C), Min:98.2 F (36.8 C), Max:98.2 F (36.8 C)   Recent Labs Lab 08/24/16 0605  WBC 8.8  CREATININE 0.89    CrCl cannot be calculated (Unknown ideal weight.).    Allergies  Allergen Reactions  . Quetiapine Other (See Comments)    Severe muscle spasms and hallucinations  . Tramadol Other (See Comments)    Muscle spasms, seizure and hallucinations  . Penicillins     Antimicrobials this admission: vanc 7/14>>  Dose adjustments this admission:  Microbiology results: 7/14 BCx2>> 7/14 R knee synovial fluid>>  Thank you for allowing pharmacy to be a part of this patient's care.  Herby AbrahamMichelle T. Tatayana Beshears, Pharm.D. [phone] 08/24/2016 9:46 AM

## 2016-08-24 NOTE — Consult Note (Signed)
Reason for Consult:Right knee swelling- Rule out septic arthritis Referring Physician: Dr. Illene Labrador Julie Sanford is an 36 y.o. female.  HPI: 36 yo female who had a fall in her driveway approximately 5 days ago landing on her right knee with immediate knee pain. She was seen in [facility] ED and told she had a fracture and was ent home. Came into [place] ED this morning complaining of severe pain and swelling in her right knee for 2 days. The swelling and redness increased yesterday. She has not had any documented fevers. 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 She complains of significant pain in the right knee.  Past Medical History:  Diagnosis Date  . Bipolar 1 disorder ([place])   . Depression   . Herpes   . Seizures ([place])     Past Surgical History:  Procedure Laterality Date  . CESAREAN SECTION      Family History  Problem Relation Age of Onset  . Diabetes Sister     Social History:  reports that she has been smoking Cigarettes.  She has been smoking about 2.00 packs per day. She has never used smokeless tobacco. She reports that she does not drink alcohol or use drugs.  Allergies:  Allergies  Allergen Reactions  . Quetiapine Other (See Comments)    Severe muscle spasms and hallucinations  . Tramadol Other (See Comments)    Muscle spasms, seizure and hallucinations  . Penicillins     Medications: I have reviewed the patient's current medications.  Results for orders placed or performed during the hospital encounter of 08/24/16 (from the past 48 hour(s))  CBC with Differential     Status: Abnormal   Collection Time: 08/24/16  6:05 AM  Result Value Ref Range   WBC 8.8 4.0 - 10.5 K/uL   RBC 4.38 3.87 - 5.11 MIL/uL   Hemoglobin 9.4 (L) 12.0 - 15.0 g/dL   HCT 29.6 (L) 36.0 - 46.0 %   MCV 67.6 (L) 78.0 - 100.0 fL   MCH 21.5 (L) 26.0 - 34.0 pg   MCHC 31.8 30.0 - 36.0 g/dL   RDW 19.9 (H) 11.5 - 15.5 %   Platelets 222 150 - 400 K/uL   Neutrophils Relative % 74 %    Lymphocytes Relative 19 %   Monocytes Relative 6 %   Eosinophils Relative 1 %   Basophils Relative 0 %   Neutro Abs 6.5 1.7 - 7.7 K/uL   Lymphs Abs 1.7 0.7 - 4.0 K/uL   Monocytes Absolute 0.5 0.1 - 1.0 K/uL   Eosinophils Absolute 0.1 0.0 - 0.7 K/uL   Basophils Absolute 0.0 0.0 - 0.1 K/uL  Comprehensive metabolic panel     Status: Abnormal   Collection Time: 08/24/16  6:05 AM  Result Value Ref Range   Sodium 137 135 - 145 mmol/L   Potassium 3.9 3.5 - 5.1 mmol/L   Chloride 96 (L) 101 - 111 mmol/L   CO2 33 (H) 22 - 32 mmol/L   Glucose, Bld 109 (H) 65 - 99 mg/dL   BUN 22 (H) 6 - 20 mg/dL   Creatinine, Ser 0.89 0.44 - 1.00 mg/dL   Calcium 8.5 (L) 8.9 - 10.3 mg/dL   Total Protein 7.4 6.5 - 8.1 g/dL   Albumin 3.3 (L) 3.5 - 5.0 g/dL   AST 34 15 - 41 U/L   ALT 33 14 - 54 U/L   Alkaline Phosphatase 67 38 - 126 U/L   Total Bilirubin  0.4 0.3 - 1.2 mg/dL   GFR calc non Af Amer >60 >60 mL/min   GFR calc Af Amer >60 >60 mL/min    Comment: (NOTE) The eGFR has been calculated using the CKD EPI equation. This calculation has not been validated in all clinical situations. eGFR's persistently <60 mL/min signify possible Chronic Kidney Disease.    Anion gap 8 5 - 15    Dg Knee Complete 4 Views Right  Result Date: 08/24/2016 CLINICAL DATA:  36 year old female with fall and right knee pain. EXAM: RIGHT KNEE - COMPLETE 4+ VIEW COMPARISON:  Radiograph dated 08/16/2016 FINDINGS: There is no acute fracture or dislocation. The bones are well mineralized. No arthritic changes. Small suprapatellar effusion. There is diffuse soft tissue swelling over the anterior knee, new compared to the prior study. Correlation with clinical exam is recommended to evaluate for an infectious process. IMPRESSION: 1. No acute osseous pathology. 2. Diffuse soft tissue swelling of the anterior knee with probable small suprapatellar effusion. Clinical correlation is recommended to evaluate for an infectious process.  Electronically Signed   By: Anner Crete M.D.   On: 08/24/2016 05:56    ROS Blood pressure 113/82, pulse (!) 102, temperature 98.2 F (36.8 C), temperature source Oral, resp. rate 16, last menstrual period 07/26/2016, SpO2 92 %. Physical Exam Physical Examination: General appearance - alert, well appearing, and in no distress Mental status - alert, oriented to person, place, and time Right knee- prepatellar swelling and erythema; No pre-tibial swelling or erythema; small effusion; pain on attempted range of motion of right knee  Procedure- After a sterile prep with betadine, the subcutaneous tissues lateral to the right knee joint are infiltrated with 3 ml of 1 % Lidocaine and approximately 30 ml of clear synovial fluid is aspirated. The fluid is sent for stat gram stain, culture and sensitivity and cell count with differential   Assessment/Plan: Right knee contusion with possible septic arthritis and bursitis- Fluid sent for analysis; Does not appear on gross exam to be infected but will await lab results and if positive for infection, she will need an arthroscopic irrigation and debridement. She also has an inflammed suprapatellar bursa which should be treated with IV antibiotics. Should not need surgical intervention for the bursa. I will check on lab results as they come in and make a plan based on those results.  Gearlean Alf 08/24/2016, 9:32 AM

## 2016-08-25 LAB — FERRITIN: Ferritin: 40 ng/mL (ref 11–307)

## 2016-08-25 LAB — CBC
HCT: 27.1 % — ABNORMAL LOW (ref 36.0–46.0)
Hemoglobin: 8.2 g/dL — ABNORMAL LOW (ref 12.0–15.0)
MCH: 20.6 pg — AB (ref 26.0–34.0)
MCHC: 30.3 g/dL (ref 30.0–36.0)
MCV: 68.1 fL — AB (ref 78.0–100.0)
PLATELETS: 184 10*3/uL (ref 150–400)
RBC: 3.98 MIL/uL (ref 3.87–5.11)
RDW: 20.1 % — ABNORMAL HIGH (ref 11.5–15.5)
WBC: 7.1 10*3/uL (ref 4.0–10.5)

## 2016-08-25 LAB — BASIC METABOLIC PANEL
Anion gap: 7 (ref 5–15)
BUN: 16 mg/dL (ref 6–20)
CHLORIDE: 101 mmol/L (ref 101–111)
CO2: 30 mmol/L (ref 22–32)
Calcium: 8.1 mg/dL — ABNORMAL LOW (ref 8.9–10.3)
Creatinine, Ser: 0.75 mg/dL (ref 0.44–1.00)
GFR calc Af Amer: >60 mL/min (ref >60)
GLUCOSE: 93 mg/dL (ref 65–99)
POTASSIUM: 4 mmol/L (ref 3.5–5.1)
Sodium: 138 mmol/L (ref 135–145)

## 2016-08-25 LAB — IRON AND TIBC
Iron: 11 ug/dL — ABNORMAL LOW (ref 28–170)
Saturation Ratios: 5 % — ABNORMAL LOW (ref 10.4–31.8)
TIBC: 241 ug/dL — AB (ref 250–450)
UIBC: 230 ug/dL

## 2016-08-25 LAB — RAPID URINE DRUG SCREEN, HOSP PERFORMED
AMPHETAMINES: NOT DETECTED
Barbiturates: NOT DETECTED
Benzodiazepines: NOT DETECTED
COCAINE: NOT DETECTED
OPIATES: POSITIVE — AB
TETRAHYDROCANNABINOL: NOT DETECTED

## 2016-08-25 LAB — MRSA PCR SCREENING: MRSA by PCR: POSITIVE — AB

## 2016-08-25 NOTE — Progress Notes (Signed)
PROGRESS NOTE    Julie FriesChristina M Sanford  ZOX:[id number] DOB: 04/04/1980 DOA: 08/24/2016 PCP: Default, Provider, MD    Brief Narrative:  36 year old female who presented with right knee swelling and pain. Patient is known to have bipolar disorder, seizures and substance abuse. Right knee pain for the last 5 days, started after trauma on the joint, her pain was persistent, worse with movement, over last 3 days, associated with right knee swelling and erythema. No fevers or chills. Positive diarrhea for the last 6 days. On initial physical examination blood pressure 113/82, heart rate 102, respiratory 16, temperature 98.2, oxygen saturation 94%. Her mucous membranes were moist, her lungs were clear to auscultation bilaterally, heart S1-S2 present and rhythmic, no gallops or murmurs, the abdomen was soft nontender, lower extremities with right knee erythema, edema, tenderness to palpation and decreased range of motion. Sodium 137, potassium 3.9, chloride 96, bicarbonate 33, glucose 109, BUN 22, creatinine 0.89, calcium 8.5, white count 8.8, hemoglobin 9.4, hematocrit 39.6, platelets of 222, positive stool Escherichia coli. Right knee arthrocentesis with 410 white cells, 80% polymorphonuclears, no crystals, Gram stain with no organisms seen. Right knee x-ray with no acute osseous pathology, diffuse soft tissue swelling of the anterior knee with probably small suprapatellar effusion.  The patient was admitted to hospital with working diagnosis of sepsis due to right knee cellulitis, to rule out septic arthritis.   Assessment & Plan:   Active Problems:   Septic arthritis of knee, right (HCC)   1. Right knee cellulitis, to rule out septic arthritis. Positive significant erythema at the right knee, will continue antibiotic therapy with IV vancomycin, will follow on cultures, cell count and temperature curve. Gram stain from synovial fluid negative. Will follow with orthopedics recommendations, continue pain  control and IV fluids. Continue baclofen, buprenoprhine, and ketorolac.   2. Escherichia coli diarrhea. Diarrhea has improved, no abdominal pain or fever, non bloody diarrhea, Stool positive for e coli but no clinical signs of dysenteria, will continue close monitoring.   3. History of drug abuse. Neuro checks per unit protocol, no clinical signs of withdrawal.   4. Microcytic anemia, multifactorial. Follow cell count in am, hb and hct stable  5. Bipolar. No agitation or confusion. Continue clonazepam, gabapentin, hydroxyzine, robaxin, trazodone, sertraline  6. Seizure disorder. Neuro checks per unit protocol, no signs of uncontrolled seizures. Continue keppra   DVT prophylaxis: enoxaparin  Code Status: full  Family Communication:  Disposition Plan: home   Consultants:   Othropedics  Procedures:  Right knee arthrocentesis     Antimicrobials:   vancomycin   Subjective: Patient with positive pain on the right knee, worse with movement, 10/10 intensity, no radiation or associated symptoms, improved with pain medications. No abdominal pain or bloody diarrhea. Stools have been more formed since admission.   Objective: Vitals:   08/24/16 0433 08/24/16 1100 08/24/16 2235 08/25/16 0644  BP: 113/82  123/72 118/65  Pulse: (!) 102  94 (!) 108  Resp: 16  16 16   Temp: 98.2 F (36.8 C)  99.4 F (37.4 C) 99.6 F (37.6 C)  TempSrc: Oral  Oral Oral  SpO2: 92%  (!) 89% (!) 81%  Weight:  40.9 kg (90 lb 2.7 oz)    Height:  5\' 1"  (1.549 m)      Intake/Output Summary (Last 24 hours) at 08/25/16 1130 Last data filed at 08/24/16 1820  Gross per 24 hour  Intake              100 ml  Output                0 ml  Net              100 ml   Filed Weights   08/24/16 1100  Weight: 40.9 kg (90 lb 2.7 oz)    Examination:  General exam: deconditioned E ENT: mild pallor, no icterus, oral mucosa moist.   Respiratory system: No wheezing, rales or rhonchi. Respiratory effort  normal. Cardiovascular system: S1 & S2 heard, RRR. No JVD, murmurs, rubs, gallops or clicks. No pedal edema. Gastrointestinal system: Abdomen is nondistended, soft and nontender. No organomegaly or masses felt. Normal bowel sounds heard. Central nervous system: Alert and oriented. No focal neurological deficits. Extremities: Symmetric 5 x 5 power. Decreased mobility on the right. Positive edema and erythema at the right knee, tender to palpation and decreased range of motion.  Skin: erythema on the right knee.    Data Reviewed: I have personally reviewed following labs and imaging studies  CBC:  Recent Labs Lab 08/24/16 0605 08/25/16 0518  WBC 8.8 7.1  NEUTROABS 6.5  --   HGB 9.4* 8.2*  HCT 29.6* 27.1*  MCV 67.6* 68.1*  PLT 222 184   Basic Metabolic Panel:  Recent Labs Lab 08/24/16 0605 08/25/16 0518  NA 137 138  K 3.9 4.0  CL 96* 101  CO2 33* 30  GLUCOSE 109* 93  BUN 22* 16  CREATININE 0.89 0.75  CALCIUM 8.5* 8.1*   GFR: Estimated Creatinine Clearance: 64 mL/min (by C-G formula based on SCr of 0.75 mg/dL). Liver Function Tests:  Recent Labs Lab 08/24/16 0605  AST 34  ALT 33  ALKPHOS 67  BILITOT 0.4  PROT 7.4  ALBUMIN 3.3*   No results for input(s): LIPASE, AMYLASE in the last 168 hours. No results for input(s): AMMONIA in the last 168 hours. Coagulation Profile: No results for input(s): INR, PROTIME in the last 168 hours. Cardiac Enzymes: No results for input(s): CKTOTAL, CKMB, CKMBINDEX, TROPONINI in the last 168 hours. BNP (last 3 results) No results for input(s): PROBNP in the last 8760 hours. HbA1C: No results for input(s): HGBA1C in the last 72 hours. CBG: No results for input(s): GLUCAP in the last 168 hours. Lipid Profile: No results for input(s): CHOL, HDL, LDLCALC, TRIG, CHOLHDL, LDLDIRECT in the last 72 hours. Thyroid Function Tests: No results for input(s): TSH, T4TOTAL, FREET4, T3FREE, THYROIDAB in the last 72 hours. Anemia  Panel:  Recent Labs  08/24/16 1807  FERRITIN 40  TIBC 241*  IRON 11*   Sepsis Labs:  Recent Labs Lab 08/24/16 1807 08/24/16 2026  LATICACIDVEN 0.8 0.6    Recent Results (from the past 240 hour(s))  Gastrointestinal Panel by PCR , Stool     Status: Abnormal   Collection Time: 08/24/16  8:49 AM  Result Value Ref Range Status   Campylobacter species NOT DETECTED NOT DETECTED Final   Plesimonas shigelloides NOT DETECTED NOT DETECTED Final   Salmonella species NOT DETECTED NOT DETECTED Final   Yersinia enterocolitica NOT DETECTED NOT DETECTED Final   Vibrio species NOT DETECTED NOT DETECTED Final   Vibrio cholerae NOT DETECTED NOT DETECTED Final   Enteroaggregative E coli (EAEC) NOT DETECTED NOT DETECTED Final   Enteropathogenic E coli (EPEC) DETECTED (A) NOT DETECTED Final    Comment: RESULT CALLED TO, READ BACK BY AND VERIFIED WITH: Ivan Croft @ 0454 08/24/16 by TCH RESULT CALLED TO, READ BACK BY AND VERIFIED WITH: L.OWEN RN AT 1840 ON  08/24/16 BY S.VANHOORNE MLS    Enterotoxigenic E coli (ETEC) NOT DETECTED NOT DETECTED Final   Shiga like toxin producing E coli (STEC) NOT DETECTED NOT DETECTED Final   Shigella/Enteroinvasive E coli (EIEC) NOT DETECTED NOT DETECTED Final   Cryptosporidium NOT DETECTED NOT DETECTED Final   Cyclospora cayetanensis NOT DETECTED NOT DETECTED Final   Entamoeba histolytica NOT DETECTED NOT DETECTED Final   Giardia lamblia NOT DETECTED NOT DETECTED Final   Adenovirus F40/41 NOT DETECTED NOT DETECTED Final   Astrovirus NOT DETECTED NOT DETECTED Final   Norovirus GI/GII NOT DETECTED NOT DETECTED Final   Rotavirus A NOT DETECTED NOT DETECTED Final   Sapovirus (I, II, IV, and V) NOT DETECTED NOT DETECTED Final  Body fluid culture     Status: None (Preliminary result)   Collection Time: 08/24/16  9:49 AM  Result Value Ref Range Status   Specimen Description KNEE RIGHT  Final   Special Requests ONLY AEROBIC SWAB SENT CANNOT DO ANAEROBIC PLATE   Final   Gram Stain   Final    NO ORGANISMS SEEN WBC PRESENT,BOTH PMN AND MONONUCLEAR CYTOSPIN Gram Stain Report Called to,Read Back By and Verified With: MARY ONEILL AT 1300 ON 07.14.2018 BY NBROOKS    Culture   Final    NO GROWTH 1 DAY Performed at [facility], [address], [place], [place] [address]    Report Status PENDING  Incomplete         Radiology Studies: Dg Knee Complete 4 Views Right  Result Date: 08/24/2016 CLINICAL DATA:  36 year old female with fall and right knee pain. EXAM: RIGHT KNEE - COMPLETE 4+ VIEW COMPARISON:  Radiograph dated 08/16/2016 FINDINGS: There is no acute fracture or dislocation. The bones are well mineralized. No arthritic changes. Small suprapatellar effusion. There is diffuse soft tissue swelling over the anterior knee, new compared to the prior study. Correlation with clinical exam is recommended to evaluate for an infectious process. IMPRESSION: 1. No acute osseous pathology. 2. Diffuse soft tissue swelling of the anterior knee with probable small suprapatellar effusion. Clinical correlation is recommended to evaluate for an infectious process. Electronically Signed   By: Elgie Collard M.D.   On: 08/24/2016 05:56        Scheduled Meds: . baclofen  10 mg Oral TID  . buprenorphine  8 mg Sublingual TID  . clonazePAM  0.25 mg Oral Q8H  . enoxaparin (LOVENOX) injection  40 mg Subcutaneous Q24H  . gabapentin  600 mg Oral TID PC & HS  . levETIRAcetam  1,000 mg Oral BID  . lurasidone  80 mg Oral QHS  . sertraline  100 mg Oral Daily  . traZODone  400 mg Oral QHS   Continuous Infusions: . vancomycin Stopped (08/25/16 0159)     LOS: 1 day        Mauricio Annett Gula, MD [facility] Pager [phone]  If 7PM-7AM, please contact night-coverage [email or web address] Password [email or web address] 08/25/2016, 11:30 AM

## 2016-08-25 NOTE — Progress Notes (Signed)
Subjective: Knee feels a little better but still has pain anteriorly and difficulty bending knee   Objective: Vital signs in last 24 hours: Temp:  [99.4 F (37.4 C)-99.6 F (37.6 C)] 99.6 F (37.6 C) (07/15 0644) Pulse Rate:  [94-108] 108 (07/15 0644) Resp:  [16] 16 (07/15 0644) BP: (118-123)/(65-72) 118/65 (07/15 0644) SpO2:  [81 %-89 %] 81 % (07/15 0644) Weight:  [40.9 kg (90 lb 2.7 oz)] 40.9 kg (90 lb 2.7 oz) (07/14 1100)  Intake/Output from previous day: 07/14 0701 - 07/15 0700 In: 100 [IV Piggyback:100] Out: -  Intake/Output this shift: No intake/output data recorded.   Recent Labs  08/24/16 0605 08/25/16 0518  HGB 9.4* 8.2*    Recent Labs  08/24/16 0605 08/25/16 0518  WBC 8.8 7.1  RBC 4.38 3.98  HCT 29.6* 27.1*  PLT 222 184    Recent Labs  08/24/16 0605 08/25/16 0518  NA 137 138  K 3.9 4.0  CL 96* 101  CO2 33* 30  BUN 22* 16  CREATININE 0.89 0.75  GLUCOSE 109* 93  CALCIUM 8.5* 8.1*   No results for input(s): LABPT, INR in the last 72 hours.  No palpable knee effusion which is an improvement from yesterday. Still with swelling and tenderness pre-patellar bursa but slightly better than yesterday.   WBC remains normal at 7.1  Assessment/Plan: Right prepatellar bursitis- knee gram stain negative. Culture not back yet. Joint improved but bursa still inflamed with erythema, although it looks better than yesterday. Continue IV antibiotics. Will recheck tomorrow. If no better may need to I & D bursa   Doyce Stonehouse V 08/25/2016, 7:14 AM

## 2016-08-25 NOTE — Progress Notes (Signed)
Pt has been checked frequently. Resting at long intervals with eyes closed, resp even and unlabored. Pt arouses to verbal stimuli, has difficulty maintaining concentration with staff. Pt is unable to keep eyes open for extended periods of time to have a conversation with staff.

## 2016-08-26 DIAGNOSIS — M00861 Arthritis due to other bacteria, right knee: Secondary | ICD-10-CM

## 2016-08-26 LAB — CBC WITH DIFFERENTIAL/PLATELET
BASOS PCT: 0 %
Basophils Absolute: 0 10*3/uL (ref 0.0–0.1)
EOS ABS: 0 10*3/uL (ref 0.0–0.7)
EOS PCT: 1 %
HCT: 23.9 % — ABNORMAL LOW (ref 36.0–46.0)
HEMOGLOBIN: 7.4 g/dL — AB (ref 12.0–15.0)
LYMPHS PCT: 26 %
Lymphs Abs: 1.2 10*3/uL (ref 0.7–4.0)
MCH: 21.3 pg — AB (ref 26.0–34.0)
MCHC: 31 g/dL (ref 30.0–36.0)
MCV: 68.9 fL — ABNORMAL LOW (ref 78.0–100.0)
Monocytes Absolute: 0.5 10*3/uL (ref 0.1–1.0)
Monocytes Relative: 10 %
NEUTROS PCT: 63 %
Neutro Abs: 3 10*3/uL (ref 1.7–7.7)
Platelets: 163 10*3/uL (ref 150–400)
RBC: 3.47 MIL/uL — AB (ref 3.87–5.11)
RDW: 20.2 % — ABNORMAL HIGH (ref 11.5–15.5)
WBC: 4.7 10*3/uL (ref 4.0–10.5)

## 2016-08-26 LAB — BASIC METABOLIC PANEL
ANION GAP: 6 (ref 5–15)
BUN: 19 mg/dL (ref 6–20)
CHLORIDE: 103 mmol/L (ref 101–111)
CO2: 30 mmol/L (ref 22–32)
Calcium: 8 mg/dL — ABNORMAL LOW (ref 8.9–10.3)
Creatinine, Ser: 0.75 mg/dL (ref 0.44–1.00)
Glucose, Bld: 93 mg/dL (ref 65–99)
POTASSIUM: 3.8 mmol/L (ref 3.5–5.1)
SODIUM: 139 mmol/L (ref 135–145)

## 2016-08-26 LAB — HIV ANTIBODY (ROUTINE TESTING W REFLEX): HIV SCREEN 4TH GENERATION: NONREACTIVE

## 2016-08-26 LAB — VANCOMYCIN, TROUGH: VANCOMYCIN TR: 23 ug/mL — AB (ref 15–20)

## 2016-08-26 MED ORDER — VANCOMYCIN HCL IN DEXTROSE 750-5 MG/150ML-% IV SOLN
750.0000 mg | Freq: Two times a day (BID) | INTRAVENOUS | Status: DC
  Administered 2016-08-26 – 2016-08-28 (×4): 750 mg via INTRAVENOUS
  Filled 2016-08-26 (×5): qty 150

## 2016-08-26 MED ORDER — MUPIROCIN 2 % EX OINT
1.0000 "application " | TOPICAL_OINTMENT | Freq: Two times a day (BID) | CUTANEOUS | Status: DC
  Administered 2016-08-26 – 2016-08-28 (×5): 1 via NASAL
  Filled 2016-08-26 (×2): qty 22

## 2016-08-26 MED ORDER — CHLORHEXIDINE GLUCONATE CLOTH 2 % EX PADS
6.0000 | MEDICATED_PAD | Freq: Every day | CUTANEOUS | Status: DC
  Administered 2016-08-27 – 2016-08-28 (×2): 6 via TOPICAL

## 2016-08-26 NOTE — Progress Notes (Signed)
Patient ID: Julie Sanford Orourke, female   DOB: 1981/01/06, 36 y.o.   MRN: [id number]  PROGRESS NOTE    Julie Sanford Inga  WUJ:[id number] DOB: 1981/01/06 DOA: 08/24/2016  PCP: Default, Provider, MD   Brief Narrative:  36 year old female with history of bipolar disorder, substance abuse, seizures who presented to ED for worsening right knee swelling for past 5 days prior to the admission associated with redness and tenderness and difficulty bending the knee. Pt also had diarrhea for past 3 days prior to the admission. X ray on admission showed soft tissue swelling and suprapatellar effusion. She was seen by ortho in consultation. For now she is on IV vanco. She also was found to have E.coli diarrhea based on GI pathogen panel.   Assessment & Plan:   Active Problems: Right knee bursitis, septic arthritis  - Ortho is following - May need aspiration if no significant improvement - Continue vanco - Continue pain management efforts  E.Coli diarrhea - Has had E.Coli positive on GI panel - Diarrhea improved - Continue supportive care   History of drug abuse / Bipolar disorder  - Stable - Continue subutex  - Continue Klonopin, Latuda  Seizure disorder - Continue Keppra   Microcytic anemia - Hgb 7.4 this am - Follow up CBC in am - Transfuse if hgb less than 7   DVT prophylaxis: Lovenox subQ Code Status: full code  Family Communication: no family at the bedside  Disposition Plan: home once cleared by ortho    Consultants:   Orthopedic surgery   Procedures:   None  Antimicrobials:   Vanco  7/14 -->   Subjective: Knee swelling still present with erythema.  Objective: Vitals:   08/25/16 0644 08/25/16 1424 08/25/16 2242 08/26/16 0621  BP: 118/65 106/62 107/65 (!) 100/56  Pulse: (!) 108 84 86 73  Resp: 16 16 18 16   Temp: 99.6 F (37.6 C) 99 F (37.2 C) 97.9 F (36.6 C) 98.6 F (37 C)  TempSrc: Oral Axillary Oral Oral  SpO2: (!) 81% 90% (!) 89% (!) 89%  Weight:        Height:        Intake/Output Summary (Last 24 hours) at 08/26/16 0842 Last data filed at 08/26/16 0600  Gross per 24 hour  Intake              320 ml  Output               50 ml  Net              270 ml   Filed Weights   08/24/16 1100  Weight: 40.9 kg (90 lb 2.7 oz)    Examination:  General exam: Appears calm and comfortable  Respiratory system: Clear to auscultation. Respiratory effort normal. Cardiovascular system: S1 & S2 heard, Rate controlled  Gastrointestinal system: Abdomen is nondistended, soft and nontender. No organomegaly or masses felt. Normal bowel sounds heard. Central nervous system: Alert and oriented. No focal neurological deficits. Extremities: right knee swelling and erythema present, difficult to bend the knee Skin: No rashes, lesions or ulcers Psychiatry: Judgement and insight appear normal. Mood & affect appropriate.   Data Reviewed: I have personally reviewed following labs and imaging studies  CBC:  Recent Labs Lab 08/24/16 0605 08/25/16 0518 08/26/16 0520  WBC 8.8 7.1 4.7  NEUTROABS 6.5  --  3.0  HGB 9.4* 8.2* 7.4*  HCT 29.6* 27.1* 23.9*  MCV 67.6* 68.1* 68.9*  PLT 222 184 163  Basic Metabolic Panel:  Recent Labs Lab 08/24/16 0605 08/25/16 0518 08/26/16 0520  NA 137 138 139  K 3.9 4.0 3.8  CL 96* 101 103  CO2 33* 30 30  GLUCOSE 109* 93 93  BUN 22* 16 19  CREATININE 0.89 0.75 0.75  CALCIUM 8.5* 8.1* 8.0*   GFR: Estimated Creatinine Clearance: 64 mL/min (by C-G formula based on SCr of 0.75 mg/dL). Liver Function Tests:  Recent Labs Lab 08/24/16 0605  AST 34  ALT 33  ALKPHOS 67  BILITOT 0.4  PROT 7.4  ALBUMIN 3.3*   No results for input(s): LIPASE, AMYLASE in the last 168 hours. No results for input(s): AMMONIA in the last 168 hours. Coagulation Profile: No results for input(s): INR, PROTIME in the last 168 hours. Cardiac Enzymes: No results for input(s): CKTOTAL, CKMB, CKMBINDEX, TROPONINI in the last 168  hours. BNP (last 3 results) No results for input(s): PROBNP in the last 8760 hours. HbA1C: No results for input(s): HGBA1C in the last 72 hours. CBG: No results for input(s): GLUCAP in the last 168 hours. Lipid Profile: No results for input(s): CHOL, HDL, LDLCALC, TRIG, CHOLHDL, LDLDIRECT in the last 72 hours. Thyroid Function Tests: No results for input(s): TSH, T4TOTAL, FREET4, T3FREE, THYROIDAB in the last 72 hours. Anemia Panel:  Recent Labs  08/24/16 1807  FERRITIN 40  TIBC 241*  IRON 11*    Sepsis Labs: @LABRCNTIP (procalcitonin:4,lacticidven:4)   Gastrointestinal Panel by PCR , Stool     Status: Abnormal   Collection Time: 08/24/16  8:49 AM  Result Value Ref Range Status   Campylobacter species NOT DETECTED NOT DETECTED Final   Plesimonas shigelloides NOT DETECTED NOT DETECTED Final   Salmonella species NOT DETECTED NOT DETECTED Final   Yersinia enterocolitica NOT DETECTED NOT DETECTED Final   Vibrio species NOT DETECTED NOT DETECTED Final   Vibrio cholerae NOT DETECTED NOT DETECTED Final   Enteroaggregative E coli (EAEC) NOT DETECTED NOT DETECTED Final   Enteropathogenic E coli (EPEC) DETECTED (A) NOT DETECTED Final   Enterotoxigenic E coli (ETEC) NOT DETECTED NOT DETECTED Final   Shiga like toxin producing E coli (STEC) NOT DETECTED NOT DETECTED Final   Shigella/Enteroinvasive E coli (EIEC) NOT DETECTED NOT DETECTED Final   Cryptosporidium NOT DETECTED NOT DETECTED Final   Cyclospora cayetanensis NOT DETECTED NOT DETECTED Final   Entamoeba histolytica NOT DETECTED NOT DETECTED Final   Giardia lamblia NOT DETECTED NOT DETECTED Final   Adenovirus F40/41 NOT DETECTED NOT DETECTED Final   Astrovirus NOT DETECTED NOT DETECTED Final   Norovirus GI/GII NOT DETECTED NOT DETECTED Final   Rotavirus A NOT DETECTED NOT DETECTED Final   Sapovirus (I, II, IV, and V) NOT DETECTED NOT DETECTED Final  Body fluid culture     Status: None (Preliminary result)   Collection Time:  08/24/16  9:49 AM  Result Value Ref Range Status   Specimen Description KNEE RIGHT  Final   Special Requests ONLY AEROBIC SWAB SENT CANNOT DO ANAEROBIC PLATE  Final   Gram Stain   Final   Culture   Final    NO GROWTH 2 DAYS Performed at [facility], [address], [place], [place] [address]    Report Status PENDING  Incomplete  Culture, blood (routine x 2)     Status: None (Preliminary result)   Collection Time: 08/24/16 11:29 AM  Result Value Ref Range Status   Specimen Description BLOOD RIGHT ANTECUBITAL  Final   Culture   Final  NO GROWTH 1 DAY Performed at [facility], [address], [place], [place] [address]    Report Status PENDING  Incomplete  Culture, blood (routine x 2)     Status: None (Preliminary result)   Collection Time: 08/24/16 11:29 AM  Result Value Ref Range Status   Specimen Description BLOOD LEFT ANTECUBITAL  Final   Culture   Final    NO GROWTH 1 DAY Performed at [facility], [address], [place], [place] [address]    Report Status PENDING  Incomplete  MRSA PCR Screening     Status: Abnormal   Collection Time: 08/24/16  7:22 PM  Result Value Ref Range Status   MRSA by PCR POSITIVE (A) NEGATIVE Final      Radiology Studies: Dg Knee Complete 4 Views Right Result Date: 08/24/2016  1. No acute osseous pathology. 2. Diffuse soft tissue swelling of the anterior knee with probable small suprapatellar effusion. Clinical correlation is recommended to evaluate for an infectious process.    Scheduled Meds: . baclofen  10 mg Oral TID  . buprenorphine  8 mg Sublingual TID  . clonazePAM  0.25 mg Oral Q8H  . enoxaparin   40 mg Subcutaneous Q24H  . gabapentin  600 mg Oral TID PC & HS  . levETIRAcetam  1,000 mg Oral BID  . lurasidone  80 mg Oral QHS  . sertraline  100 mg Oral Daily  . traZODone  400 mg Oral QHS   Continuous Infusions: . vancomycin 500 mg (08/26/16 0417)     LOS: 2 days    Time spent: 25 minutes  Greater than  50% of the time spent on counseling and coordinating the care.   Manson Passey, MD [facility] Pager [phone]  If 7PM-7AM, please contact night-coverage [email or web address] Password [facility] 08/26/2016, 8:42 AM

## 2016-08-26 NOTE — Consult Note (Signed)
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  Purulence and tender to touch.  Wound type:Infectious Pressure Injury POA: NA Measurement:Thumb:  1 cm x 0.5 cm x 0.2 cm  First finger: Nail bed is fluctutant and boggy Wound ZOX:WRUEAVbed:yellow with purulence noted Drainage (amount, consistency, odor) purulence Periwound:erythema and tenderness Dressing procedure/placement/frequency:Cleanse wounds to right fingers with NS and pat gently dry.  Apply Xeroform gauze and cover with 2x2 and tape.  Change daily.  Will not follow at this time.  Please re-consult if needed.  Maple HudsonKaren Patsy Varma RN BSN CWON Pager [phone]

## 2016-08-26 NOTE — Progress Notes (Signed)
Subjective: Knee is doing a little better   Objective: Vital signs in last 24 hours: Temp:  [97.9 F (36.6 C)-99 F (37.2 C)] 98.6 F (37 C) (07/16 0621) Pulse Rate:  [73-86] 73 (07/16 0621) Resp:  [16-18] 16 (07/16 0621) BP: (100-107)/(56-65) 100/56 (07/16 0621) SpO2:  [89 %-90 %] 89 % (07/16 0621)  Intake/Output from previous day: 07/15 0701 - 07/16 0700 In: 320 [P.O.:120; IV Piggyback:200] Out: 50 [Urine:50] Intake/Output this shift: No intake/output data recorded.   Recent Labs  08/24/16 0605 08/25/16 0518 08/26/16 0520  HGB 9.4* 8.2* 7.4*    Recent Labs  08/25/16 0518 08/26/16 0520  WBC 7.1 4.7  RBC 3.98 3.47*  HCT 27.1* 23.9*  PLT 184 163    Recent Labs  08/25/16 0518 08/26/16 0520  NA 138 139  K 4.0 3.8  CL 101 103  CO2 30 30  BUN 16 19  CREATININE 0.75 0.75  GLUCOSE 93 93  CALCIUM 8.1* 8.0*   No results for input(s): LABPT, INR in the last 72 hours.  No intraarticular effusion. Still with swelling and redness in bursa but it has improved. Cultures remain negative from joint aspiration  Assessment/Plan: Right knee bursitis- Continue antibiotics. Slow improvement. May aspirate bursa tomorrow if not further resolved. No signs of joint involvement   Jarin Cornfield V 08/26/2016, 7:23 AM

## 2016-08-26 NOTE — Progress Notes (Signed)
CSW consulted to assist with medications. CSW is unable to assist with this request.  Cori RazorJamie Ticia Virgo LCSW [phone]

## 2016-08-26 NOTE — Progress Notes (Signed)
Critical vanc trough called to me and before I could get to computer --pharmcist called me with changes to vancomycin. -please see results and orders

## 2016-08-26 NOTE — Progress Notes (Addendum)
Pharmacy Antibiotic Note  Julie FriesChristina M Sanford is a 36 y.o. female admitted on 08/24/2016 with septic bursitis.  Pharmacy has been consulted for vancomycin dosing. First dose of vanc given before knee was tapped.  Today, 08/26/2016:  D3 abx  VT elevated at 23 on current regimen  Afebrile  WBC wnl, stable  SCr stable wnl  Plan:  Reduce vancomycin to 750 mg IV q12 hr; goal trough 10-15 mcg/mL  Measure vancomycin trough levels at steady state as indicated   Height: 5\' 1"  (154.9 cm) Weight: 90 lb 2.7 oz (40.9 kg) IBW/kg (Calculated) : 47.8  Temp (24hrs), Avg:98.5 F (36.9 C), Min:97.9 F (36.6 C), Max:99 F (37.2 C)   Recent Labs Lab 08/24/16 0605 08/24/16 1807 08/24/16 2026 08/25/16 0518 08/26/16 0520 08/26/16 1106  WBC 8.8  --   --  7.1 4.7  --   CREATININE 0.89  --   --  0.75 0.75  --   LATICACIDVEN  --  0.8 0.6  --   --   --   VANCOTROUGH  --   --   --   --   --  23*    Estimated Creatinine Clearance: 64 mL/min (by C-G formula based on SCr of 0.75 mg/dL).    Allergies  Allergen Reactions  . Quetiapine Other (See Comments)    Severe muscle spasms and hallucinations  . Tramadol Other (See Comments)    Muscle spasms, seizure and hallucinations  . Penicillins     Antimicrobials this admission:  7/14 Vanc >>  Dose adjustments this admission: 7/16: 1130 VT = 23, drawn appropriately; reduce vancomycin to 750 q12  Microbiology results:  7/14 BCx: ngtd 7/14 knee synovial fluid cx: ngtd (vanc x1 before drawn) 7/14 GI panel: EPEC 7/14 MRSA PCR: positive  Thank you for allowing pharmacy to be a part of this patient's care.  Bernadene Personrew Camille Dragan, PharmD, BCPS Pager: [phone] 08/26/2016, 12:16 PM

## 2016-08-27 LAB — BASIC METABOLIC PANEL
ANION GAP: 7 (ref 5–15)
BUN: 20 mg/dL (ref 6–20)
CHLORIDE: 104 mmol/L (ref 101–111)
CO2: 30 mmol/L (ref 22–32)
CREATININE: 0.94 mg/dL (ref 0.44–1.00)
Calcium: 8.4 mg/dL — ABNORMAL LOW (ref 8.9–10.3)
GFR calc non Af Amer: >60 mL/min (ref >60)
Glucose, Bld: 115 mg/dL — ABNORMAL HIGH (ref 65–99)
POTASSIUM: 3.9 mmol/L (ref 3.5–5.1)
SODIUM: 141 mmol/L (ref 135–145)

## 2016-08-27 LAB — CBC
HCT: 25.6 % — ABNORMAL LOW (ref 36.0–46.0)
HEMOGLOBIN: 8 g/dL — AB (ref 12.0–15.0)
MCH: 22 pg — ABNORMAL LOW (ref 26.0–34.0)
MCHC: 31.3 g/dL (ref 30.0–36.0)
MCV: 70.3 fL — ABNORMAL LOW (ref 78.0–100.0)
Platelets: 210 10*3/uL (ref 150–400)
RBC: 3.64 MIL/uL — AB (ref 3.87–5.11)
RDW: 20.4 % — ABNORMAL HIGH (ref 11.5–15.5)
WBC: 4.5 10*3/uL (ref 4.0–10.5)

## 2016-08-27 NOTE — Progress Notes (Signed)
Subjective: Feeling a lot better today with decreased knee pain   Objective: Vital signs in last 24 hours: Temp:  [98.1 F (36.7 C)-98.4 F (36.9 C)] 98.4 F (36.9 C) (07/17 0508) Pulse Rate:  [81-85] 84 (07/17 0508) Resp:  [16-18] 16 (07/17 0508) BP: (108-118)/(65-70) 112/70 (07/17 0508) SpO2:  [87 %-97 %] 90 % (07/17 0508)  Intake/Output from previous day: 07/16 0701 - 07/17 0700 In: 940 [P.O.:790; IV Piggyback:150] Out: 1250 [Urine:1100; Stool:150] Intake/Output this shift: Total I/O In: 250 [P.O.:100; IV Piggyback:150] Out: 150 [Urine:150]   Recent Labs  08/25/16 0518 08/26/16 0520 08/27/16 0520  HGB 8.2* 7.4* 8.0*    Recent Labs  08/26/16 0520 08/27/16 0520  WBC 4.7 4.5  RBC 3.47* 3.64*  HCT 23.9* 25.6*  PLT 163 210    Recent Labs  08/26/16 0520 08/27/16 0520  NA 139 141  K 3.8 3.9  CL 103 104  CO2 30 30  BUN 19 20  CREATININE 0.75 0.94  GLUCOSE 93 115*  CALCIUM 8.0* 8.4*   No results for input(s): LABPT, INR in the last 72 hours.  No effusion right knee  Pre-patellar bursal swelling and erythema decreased significantly from yesterday afternoon Synovial cultures negative x 3 days  Assessment/Plan: Right knee prepatellar bursitis- Looks much better today. No need to drain it. Will check again tomorrow and if still looking fine will sign off   Rumor Sun V 08/27/2016, 11:06 AM

## 2016-08-27 NOTE — Progress Notes (Signed)
Patient ID: Julie Sanford, female   DOB: [birth year], 36 y.o.   MRN: [id number]  PROGRESS NOTE    Julie Sanford  WUJ:[id number] DOB: May 17, 1980 DOA: 08/24/2016  PCP: Default, Provider, MD   Brief Narrative:  36 year old female with history of bipolar disorder, substance abuse, seizures who presented to ED for worsening right knee swelling for past 5 days prior to the admission associated with redness and tenderness and difficulty bending the knee. Pt also had diarrhea for past 3 days prior to the admission. X ray on admission showed soft tissue swelling and suprapatellar effusion. She was seen by ortho in consultation. For now she is on IV vanco. She also was found to have E.coli diarrhea based on GI pathogen panel.   Assessment & Plan:   Active Problems: Right knee bursitis, septic arthritis  - Pt continues to have swelling and redness over the right knee - Appreciate ortho input, may need aspiration - Continue vancomycin - Continue pain management efforts  E.Coli diarrhea - E.Coli positive on GI PCR panel - Diarrhea resolved with supportive care   History of drug abuse / Bipolar disorder  - Stable  - Continue subutex, klonopin and latuda   Seizure disorder - Continue Keppra - No reports of seizures in hospital   Microcytic anemia - Hgb 8 - Transfuse for hgb less than 7  Right thumb chronic MRSA infection - Seen by WOC, recommended xeroform gauze and daily dressing change    DVT prophylaxis: Hold Lovenox due to anemia, use SCD's  Code Status: full code  Family Communication: family not at the bedside this am Disposition Plan: not yet stable for d/c, home once cleared by ortho   Consultants:   Orthopedic surgery   WOC  Procedures:   None   Antimicrobials:   Vanco 7/14 -->   Subjective: Knee swelling and redness still present.   Objective: Vitals:   08/26/16 1427 08/26/16 1434 08/26/16 2259 08/27/16 0508  BP: 118/70  108/65 112/70  Pulse: 81  85 84   Resp: 18  18 16   Temp: 98.1 F (36.7 C)  98.1 F (36.7 C) 98.4 F (36.9 C)  TempSrc: Oral  Axillary Oral  SpO2: (!) 87% 95% 97% 90%  Weight:      Height:        Intake/Output Summary (Last 24 hours) at 08/27/16 0745 Last data filed at 08/27/16 0523  Gross per 24 hour  Intake              940 ml  Output             1250 ml  Net             -310 ml   Filed Weights   08/24/16 1100  Weight: 40.9 kg (90 lb 2.7 oz)    Physical Exam  Constitutional: Appears well-developed and well-nourished. No distress.  CVS: RRR, S1/S2 +, no murmurs, no gallops, no carotid bruit.  Pulmonary: Effort and breath sounds normal, no stridor, rhonchi, wheezes, rales.  Abdominal: Soft. BS +,  no distension, tenderness, rebound or guarding.  Musculoskeletal: Normal range of motion. No edema and no tenderness.  Lymphadenopathy: No lymphadenopathy noted, cervical, inguinal. Neuro: Alert. Normal reflexes, muscle tone coordination. No cranial nerve deficit. Skin: right thumb chronic MRSA infections, right knee swelling and erythema  Psychiatric: Normal mood and affect. Behavior, judgment, thought content normal.     Data Reviewed: I have personally reviewed following labs and imaging studies  CBC:  Recent Labs Lab 08/24/16 0605 08/25/16 0518 08/26/16 0520 08/27/16 0520  WBC 8.8 7.1 4.7 4.5  NEUTROABS 6.5  --  3.0  --   HGB 9.4* 8.2* 7.4* 8.0*  HCT 29.6* 27.1* 23.9* 25.6*  MCV 67.6* 68.1* 68.9* 70.3*  PLT 222 184 163 210   Basic Metabolic Panel:  Recent Labs Lab 08/24/16 0605 08/25/16 0518 08/26/16 0520 08/27/16 0520  NA 137 138 139 141  K 3.9 4.0 3.8 3.9  CL 96* 101 103 104  CO2 33* 30 30 30   GLUCOSE 109* 93 93 115*  BUN 22* 16 19 20   CREATININE 0.89 0.75 0.75 0.94  CALCIUM 8.5* 8.1* 8.0* 8.4*   GFR: Estimated Creatinine Clearance: 54.4 mL/min (by C-G formula based on SCr of 0.94 mg/dL). Liver Function Tests:  Recent Labs Lab 08/24/16 0605  AST 34  ALT 33  ALKPHOS 67    BILITOT 0.4  PROT 7.4  ALBUMIN 3.3*   No results for input(s): LIPASE, AMYLASE in the last 168 hours. No results for input(s): AMMONIA in the last 168 hours. Coagulation Profile: No results for input(s): INR, PROTIME in the last 168 hours. Cardiac Enzymes: No results for input(s): CKTOTAL, CKMB, CKMBINDEX, TROPONINI in the last 168 hours. BNP (last 3 results) No results for input(s): PROBNP in the last 8760 hours. HbA1C: No results for input(s): HGBA1C in the last 72 hours. CBG: No results for input(s): GLUCAP in the last 168 hours. Lipid Profile: No results for input(s): CHOL, HDL, LDLCALC, TRIG, CHOLHDL, LDLDIRECT in the last 72 hours. Thyroid Function Tests: No results for input(s): TSH, T4TOTAL, FREET4, T3FREE, THYROIDAB in the last 72 hours. Anemia Panel:  Recent Labs  08/24/16 1807  FERRITIN 40  TIBC 241*  IRON 11*    Sepsis Labs: @LABRCNTIP (procalcitonin:4,lacticidven:4)   Gastrointestinal Panel by PCR , Stool     Status: Abnormal   Collection Time: 08/24/16  8:49 AM  Result Value Ref Range Status   Campylobacter species NOT DETECTED NOT DETECTED Final   Plesimonas shigelloides NOT DETECTED NOT DETECTED Final   Salmonella species NOT DETECTED NOT DETECTED Final   Yersinia enterocolitica NOT DETECTED NOT DETECTED Final   Vibrio species NOT DETECTED NOT DETECTED Final   Vibrio cholerae NOT DETECTED NOT DETECTED Final   Enteroaggregative E coli (EAEC) NOT DETECTED NOT DETECTED Final   Enteropathogenic E coli (EPEC) DETECTED (A) NOT DETECTED Final   Enterotoxigenic E coli (ETEC) NOT DETECTED NOT DETECTED Final   Shiga like toxin producing E coli (STEC) NOT DETECTED NOT DETECTED Final   Shigella/Enteroinvasive E coli (EIEC) NOT DETECTED NOT DETECTED Final   Cryptosporidium NOT DETECTED NOT DETECTED Final   Cyclospora cayetanensis NOT DETECTED NOT DETECTED Final   Entamoeba histolytica NOT DETECTED NOT DETECTED Final   Giardia lamblia NOT DETECTED NOT DETECTED  Final   Adenovirus F40/41 NOT DETECTED NOT DETECTED Final   Astrovirus NOT DETECTED NOT DETECTED Final   Norovirus GI/GII NOT DETECTED NOT DETECTED Final   Rotavirus A NOT DETECTED NOT DETECTED Final   Sapovirus (I, II, IV, and V) NOT DETECTED NOT DETECTED Final  Body fluid culture     Status: None (Preliminary result)   Collection Time: 08/24/16  9:49 AM  Result Value Ref Range Status   Specimen Description KNEE RIGHT  Final   Special Requests ONLY AEROBIC SWAB SENT CANNOT DO ANAEROBIC PLATE  Final   Gram Stain   Final   Culture   Final    NO GROWTH 2 DAYS Performed at  [facility], [address] [place], [place] [address]    Report Status PENDING  Incomplete  Culture, blood (routine x 2)     Status: None (Preliminary result)   Collection Time: 08/24/16 11:29 AM  Result Value Ref Range Status   Specimen Description BLOOD RIGHT ANTECUBITAL  Final   Culture   Final    NO GROWTH 1 DAY Performed at [facility], [address] [place], [place] [address]    Report Status PENDING  Incomplete  Culture, blood (routine x 2)     Status: None (Preliminary result)   Collection Time: 08/24/16 11:29 AM  Result Value Ref Range Status   Specimen Description BLOOD LEFT ANTECUBITAL  Final   Culture   Final    NO GROWTH 1 DAY Performed at [facility], [address] [place], [place] [address]    Report Status PENDING  Incomplete  MRSA PCR Screening     Status: Abnormal   Collection Time: 08/24/16  7:22 PM  Result Value Ref Range Status   MRSA by PCR POSITIVE (A) NEGATIVE Final      Radiology Studies: Dg Knee Complete 4 Views Right Result Date: 08/24/2016  1. No acute osseous pathology. 2. Diffuse soft tissue swelling of the anterior knee with probable small suprapatellar effusion. Clinical correlation is recommended to evaluate for an infectious process.    Scheduled Meds: . baclofen  10 mg Oral TID  . buprenorphine  8 mg Sublingual TID  . clonazePAM  0.25 mg  Oral Q8H  . enoxaparin   40 mg Subcutaneous Q24H  . gabapentin  600 mg Oral TID PC & HS  . levETIRAcetam  1,000 mg Oral BID  . lurasidone  80 mg Oral QHS  . sertraline  100 mg Oral Daily  . traZODone  400 mg Oral QHS   Continuous Infusions: . vancomycin Stopped (08/26/16 2046)     LOS: 3 days    Time spent: 25 minutes  Greater than 50% of the time spent on counseling and coordinating the care.   Manson PasseyAlma Jshawn Hurta, MD [facility] Pager [phone]  If 7PM-7AM, please contact night-coverage [email or web address] Password [email or web address] 08/27/2016, 7:45 AM

## 2016-08-27 NOTE — Progress Notes (Signed)
Spoke with patient at bedside. States she lives at home with multiple family members, a friend takes her to her appts, she has no issues getting medications. She states she has a nebulizer at home, was wanting oxygen, patient does not qualify and is a current smoker. Patient states she uses crutches to ambulate. She currently does not have a PCP, provided her with a list of area practices that take Medicaid. She will contact one of them for an appointment. No further d/c needs identified. Will continue to follow for d/c needs.

## 2016-08-28 DIAGNOSIS — R569 Unspecified convulsions: Secondary | ICD-10-CM

## 2016-08-28 DIAGNOSIS — L03115 Cellulitis of right lower limb: Secondary | ICD-10-CM

## 2016-08-28 DIAGNOSIS — M7041 Prepatellar bursitis, right knee: Principal | ICD-10-CM

## 2016-08-28 DIAGNOSIS — F1910 Other psychoactive substance abuse, uncomplicated: Secondary | ICD-10-CM

## 2016-08-28 LAB — CBC
HEMATOCRIT: 25.8 % — AB (ref 36.0–46.0)
HEMOGLOBIN: 7.9 g/dL — AB (ref 12.0–15.0)
MCH: 21.4 pg — ABNORMAL LOW (ref 26.0–34.0)
MCHC: 30.6 g/dL (ref 30.0–36.0)
MCV: 69.7 fL — AB (ref 78.0–100.0)
Platelets: 241 10*3/uL (ref 150–400)
RBC: 3.7 MIL/uL — ABNORMAL LOW (ref 3.87–5.11)
RDW: 20.3 % — AB (ref 11.5–15.5)
WBC: 5 10*3/uL (ref 4.0–10.5)

## 2016-08-28 LAB — BASIC METABOLIC PANEL
Anion gap: 8 (ref 5–15)
BUN: 25 mg/dL — AB (ref 6–20)
CHLORIDE: 105 mmol/L (ref 101–111)
CO2: 29 mmol/L (ref 22–32)
CREATININE: 0.88 mg/dL (ref 0.44–1.00)
Calcium: 8.6 mg/dL — ABNORMAL LOW (ref 8.9–10.3)
GFR calc Af Amer: >60 mL/min (ref >60)
GFR calc non Af Amer: >60 mL/min (ref >60)
GLUCOSE: 119 mg/dL — AB (ref 65–99)
Potassium: 3.9 mmol/L (ref 3.5–5.1)
Sodium: 142 mmol/L (ref 135–145)

## 2016-08-28 LAB — BODY FLUID CULTURE
Culture: NO GROWTH
Gram Stain: NONE SEEN

## 2016-08-28 MED ORDER — SULFAMETHOXAZOLE-TRIMETHOPRIM 800-160 MG PO TABS: 1.0000 | ORAL_TABLET | Freq: Two times a day (BID) | ORAL | 0 refills | Status: AC

## 2016-08-28 MED ORDER — MUPIROCIN 2 % EX OINT: 1.0000 "application " | TOPICAL_OINTMENT | Freq: Two times a day (BID) | CUTANEOUS | 0 refills | Status: AC

## 2016-08-28 MED ORDER — HYDROCODONE-ACETAMINOPHEN 5-325 MG PO TABS: 1.0000 | ORAL_TABLET | Freq: Four times a day (QID) | ORAL | 0 refills | Status: DC | PRN

## 2016-08-28 NOTE — Progress Notes (Signed)
Subjective: Doing better. Wants to go home. Requesting walker to use at home   Objective: Vital signs in last 24 hours: Temp:  [97.7 F (36.5 C)-97.9 F (36.6 C)] 97.9 F (36.6 C) (07/18 1500) Pulse Rate:  [63-85] 72 (07/18 1516) Resp:  [16] 16 (07/18 1500) BP: (110-125)/(65-102) 110/80 (07/18 1516) SpO2:  [90 %-93 %] 93 % (07/18 1500)  Intake/Output from previous day: 07/17 0701 - 07/18 0700 In: 1420 [P.O.:1120; IV Piggyback:300] Out: 1100 [Urine:1100] Intake/Output this shift: Total I/O In: 670 [P.O.:670] Out: 240 [Urine:240]   Recent Labs  08/26/16 0520 08/27/16 0520 08/28/16 0507  HGB 7.4* 8.0* 7.9*    Recent Labs  08/27/16 0520 08/28/16 0507  WBC 4.5 5.0  RBC 3.64* 3.70*  HCT 25.6* 25.8*  PLT 210 241    Recent Labs  08/27/16 0520 08/28/16 0507  NA 141 142  K 3.9 3.9  CL 104 105  CO2 30 29  BUN 20 25*  CREATININE 0.94 0.88  GLUCOSE 115* 119*  CALCIUM 8.4* 8.6*   No results for input(s): LABPT, INR in the last 72 hours.  Bursa still swollen and inflamed but improved. Was up walking in rrom when I came to see her  Assessment/Plan: Right knee bursitis- Improved. OK to go home from my standpoint. I have ordered a walker for her as she is needing it to get around.   Julie Sanford,Julie Sanford V 08/28/2016, 3:46 PM

## 2016-08-28 NOTE — Discharge Summary (Signed)
Physician Discharge Summary  Julie Sanford WUJ:[id number] DOB: July 29, 1980 DOA: 08/24/2016  PCP: Default, Provider, MD  Admit date: 08/24/2016 Discharge date: 08/28/2016  Admitted From: Home Disposition:  Home  Recommendations for Outpatient Follow-up:  1. Follow up with PCP in 1-2 weeks 2. Follow up with Orthopedic Surgery Dr. Lequita Halt in 2 weeks 3. Follow up with Infectious Diseases Dr. Ninetta Lights in 2 weeks 4. Please obtain CMP/CBC, Mag, Phos in one week  Home Health: No  Equipment/Devices: Agricultural consultant with 5" Wheels  Discharge Condition: Stable  CODE STATUS: FULL CODE  Diet recommendation:   Brief/Interim Summary: The patient is a 36 year old female with history of bipolar disorder, substance abuse, seizures who presented to ED for worsening right knee swelling for past 5 days prior to the admission associated with redness and tenderness and difficulty bending the knee. Pt also had diarrhea for past 3 days prior to the admission. X ray on admission showed soft tissue swelling and suprapatellar effusion. She was seen by ortho in consultation. She was on IV Vancomycin and it was transitioned to po Bactrim at D/C. She also was found to have E.coli diarrhea based on GI pathogen panel. Ortho was consulted and aspirated some synovial fluid that was sent for analysis She improved and Ortho recommended not draining the bursa. Fluid analysis results were not consistent with Septic Arthritis (WBC were 410) and after Discussion with ID Abx were changed to po Bactrim. Patient improved and will be D/C'd and need to follow up with PCP, Orthopedics, and Infectious Diseases as an outpatient.   Discharge Diagnoses:  Active Problems:   Septic arthritis of knee, right (HCC)  Right knee Prepatellar Bursitis -Pt continues to have swelling and redness over the right knee but much improved -Appreciate ortho input; Synovial Fluid not consistent with Septic Arthrititis; Per Ortho no need to drain  it -Changed Vancomycin to po Bactrim x 14 days per ID Recc's -Continue pain management efforts with Home pain meds -PT recommends no follow up but recommends Walker  E.Coli Diarrhea -E.Coli positive on GI PCR panel -Diarrhea resolved with supportive care  -Continue to Monitor as an outpatient   History of drug abuse / Bipolar disorder  - Stable  - Continue subutex, klonopin and latuda and follow up with Regularly scheduled Pain Clinic Doctor  Seizure disorder - Continue Keppra - No reports of seizures in hospital   Microcytic Anemia -Hgb around  -Transfuse for hgb less than 7 -Follow Up and establish with PCP to have CBC rechecked  Right thumb chronic MRSA infection - Seen by WOC, recommended xeroform gauze and daily dressing change  -Discussed Case with Dr. Ninetta Lights and he does not believe that Right Thumb seeded Right Knee -Will continue po Bactrim for 2 weeks and follow up with ID Clinic as an outpatient.    Discharge Instructions  Discharge Instructions    Call MD for:  difficulty breathing, headache or visual disturbances    Complete by:  As directed    Call MD for:  extreme fatigue    Complete by:  As directed    Call MD for:  hives    Complete by:  As directed    Call MD for:  persistant dizziness or light-headedness    Complete by:  As directed    Call MD for:  persistant nausea and vomiting    Complete by:  As directed    Call MD for:  redness, tenderness, or signs of infection (pain, swelling, redness, odor or  green/yellow discharge around incision site)    Complete by:  As directed    Call MD for:  severe uncontrolled pain    Complete by:  As directed    Call MD for:  temperature >100.4    Complete by:  As directed    Diet - low sodium heart healthy    Complete by:  As directed    Discharge instructions    Complete by:  As directed    Follow up with PCP, Orthopedics, and Infectious Diseases in the outpatient setting. Take all medications as  prescribed. If symptoms change or worsen please return to the ED   Increase activity slowly    Complete by:  As directed      Allergies as of 08/28/2016      Reactions   Quetiapine Other (See Comments)   Severe muscle spasms and hallucinations   Tramadol Other (See Comments)   Muscle spasms, seizure and hallucinations   Penicillins       Medication List    TAKE these medications   baclofen 10 MG tablet Commonly known as:  LIORESAL Take 10 mg by mouth 3 (three) times daily.   clonazePAM 0.5 MG tablet Commonly known as:  KLONOPIN Take 0.25 mg by mouth every 8 (eight) hours.   gabapentin 300 MG capsule Commonly known as:  NEURONTIN Take 600 mg by mouth 4 (four) times daily - after meals and at bedtime.   hydrOXYzine 50 MG capsule Commonly known as:  VISTARIL Take 50-100 mg by mouth 3 (three) times daily as needed for anxiety or itching.   ipratropium-albuterol 0.5-2.5 (3) MG/3ML Soln Commonly known as:  DUONEB Take 3 mLs by nebulization every 6 (six) hours as needed (sob, wheezing).   LATUDA 80 MG Tabs tablet Generic drug:  lurasidone Take 80 mg by mouth at bedtime.   levETIRAcetam 500 MG tablet Commonly known as:  KEPPRA Take 1,000 mg by mouth 2 (two) times daily.   methocarbamol 750 MG tablet Commonly known as:  ROBAXIN Take 1,500 mg by mouth 4 (four) times daily as needed for muscle spasms.   mupirocin ointment 2 % Commonly known as:  BACTROBAN Place 1 application into the nose 2 (two) times daily.   PROAIR HFA 108 (90 Base) MCG/ACT inhaler Generic drug:  albuterol Inhale 2 puffs into the lungs every 6 (six) hours as needed for wheezing or shortness of breath.   sertraline 100 MG tablet Commonly known as:  ZOLOFT Take 100 mg by mouth daily.   SUBOXONE 8-2 MG Film Generic drug:  Buprenorphine HCl-Naloxone HCl Place 1 Film under the tongue 3 (three) times daily.   sulfamethoxazole-trimethoprim 800-160 MG tablet Commonly known as:  BACTRIM DS,SEPTRA  DS Take 1 tablet by mouth 2 (two) times daily.   traZODone 100 MG tablet Commonly known as:  DESYREL Take 400 mg by mouth at bedtime.            Durable Medical Equipment        Start     Ordered   08/28/16 1645  For home use only DME Walker rolling  [facility](Walkers)  Once    Question:  Patient needs a walker to treat with the following condition  Answer:  Gait instability   08/28/16 1646   08/28/16 1549  For home use only DME Walker  Once    Question:  Patient needs a walker to treat with the following condition  Answer:  Patellar bursitis of right knee   08/28/16 1548  Allergies  Allergen Reactions  . Quetiapine Other (See Comments)    Severe muscle spasms and hallucinations  . Tramadol Other (See Comments)    Muscle spasms, seizure and hallucinations  . Penicillins     Consultations:  Orthopedics Dr. Lequita Halt   Discussed Case with Infectious Diease Physician Dr. Enedina Finner  Procedures/Studies: Dg Knee Complete 4 Views Right  Result Date: 08/24/2016 CLINICAL DATA:  36 year old female with fall and right knee pain. EXAM: RIGHT KNEE - COMPLETE 4+ VIEW COMPARISON:  Radiograph dated 08/16/2016 FINDINGS: There is no acute fracture or dislocation. The bones are well mineralized. No arthritic changes. Small suprapatellar effusion. There is diffuse soft tissue swelling over the anterior knee, new compared to the prior study. Correlation with clinical exam is recommended to evaluate for an infectious process. IMPRESSION: 1. No acute osseous pathology. 2. Diffuse soft tissue swelling of the anterior knee with probable small suprapatellar effusion. Clinical correlation is recommended to evaluate for an infectious process. Electronically Signed   By: Elgie Collard M.D.   On: 08/24/2016 05:56     Subjective: Seen and examined at bedside and knee was improved. No nausea or vomiting. No CP or SOB. Felt better and was able to ambulate with a Rolling Walker. No other complaints or  concerns and was ready to go home.  Discharge Exam: Vitals:   08/28/16 1500 08/28/16 1516  BP: (!) 119/102 110/80  Pulse: 73 72  Resp: 16   Temp: 97.9 F (36.6 C)    Vitals:   08/28/16 0530 08/28/16 0542 08/28/16 1500 08/28/16 1516  BP: 112/75 110/70 (!) 119/102 110/80  Pulse: 85 83 73 72  Resp:  16 16   Temp:  97.8 F (36.6 C) 97.9 F (36.6 C)   TempSrc:  Oral Oral   SpO2:  90% 93%   Weight:      Height:       General: Pt is alert, awake, not in acute distress Cardiovascular: RRR, S1/S2 +, no rubs, no gallops Respiratory: CTA bilaterally, no wheezing, no rhonchi Abdominal: Soft, NT, ND, bowel sounds + Extremities: Right Knee very erythematous and slightly swollen, no cyanosis; Right Thumb wrapped.   The results of significant diagnostics from this hospitalization (including imaging, microbiology, ancillary and laboratory) are listed below for reference.    Microbiology: Recent Results (from the past 240 hour(s))  Gastrointestinal Panel by PCR , Stool     Status: Abnormal   Collection Time: 08/24/16  8:49 AM  Result Value Ref Range Status   Campylobacter species NOT DETECTED NOT DETECTED Final   Plesimonas shigelloides NOT DETECTED NOT DETECTED Final   Salmonella species NOT DETECTED NOT DETECTED Final   Yersinia enterocolitica NOT DETECTED NOT DETECTED Final   Vibrio species NOT DETECTED NOT DETECTED Final   Vibrio cholerae NOT DETECTED NOT DETECTED Final   Enteroaggregative E coli (EAEC) NOT DETECTED NOT DETECTED Final   Enteropathogenic E coli (EPEC) DETECTED (A) NOT DETECTED Final    Comment: RESULT CALLED TO, READ BACK BY AND VERIFIED WITH: Ivan Croft @ 2956 08/24/16 by TCH RESULT CALLED TO, READ BACK BY AND VERIFIED WITH: L.OWEN RN AT 1840 ON 08/24/16 BY S.VANHOORNE MLS    Enterotoxigenic E coli (ETEC) NOT DETECTED NOT DETECTED Final   Shiga like toxin producing E coli (STEC) NOT DETECTED NOT DETECTED Final   Shigella/Enteroinvasive E coli (EIEC) NOT  DETECTED NOT DETECTED Final   Cryptosporidium NOT DETECTED NOT DETECTED Final   Cyclospora cayetanensis NOT DETECTED NOT DETECTED Final  Entamoeba histolytica NOT DETECTED NOT DETECTED Final   Giardia lamblia NOT DETECTED NOT DETECTED Final   Adenovirus F40/41 NOT DETECTED NOT DETECTED Final   Astrovirus NOT DETECTED NOT DETECTED Final   Norovirus GI/GII NOT DETECTED NOT DETECTED Final   Rotavirus A NOT DETECTED NOT DETECTED Final   Sapovirus (I, II, IV, and V) NOT DETECTED NOT DETECTED Final  Body fluid culture     Status: None   Collection Time: 08/24/16  9:49 AM  Result Value Ref Range Status   Specimen Description KNEE RIGHT  Final   Special Requests ONLY AEROBIC SWAB SENT CANNOT DO ANAEROBIC PLATE  Final   Gram Stain   Final    NO ORGANISMS SEEN WBC PRESENT,BOTH PMN AND MONONUCLEAR CYTOSPIN Gram Stain Report Called to,Read Back By and Verified With: MARY ONEILL AT 1300 ON 07.14.2018 BY NBROOKS    Culture   Final    NO GROWTH 3 DAYS Performed at [facility], [address] [place], [place] [address]    Report Status 08/28/2016 FINAL  Final  Culture, blood (routine x 2)     Status: None (Preliminary result)   Collection Time: 08/24/16 11:29 AM  Result Value Ref Range Status   Specimen Description BLOOD RIGHT ANTECUBITAL  Final   Special Requests IN PEDIATRIC BOTTLE Blood Culture adequate volume  Final   Culture   Final    NO GROWTH 4 DAYS Performed at [facility], [address] [place], [place] [address]    Report Status PENDING  Incomplete  Culture, blood (routine x 2)     Status: None (Preliminary result)   Collection Time: 08/24/16 11:29 AM  Result Value Ref Range Status   Specimen Description BLOOD LEFT ANTECUBITAL  Final   Special Requests IN PEDIATRIC BOTTLE Blood Culture adequate volume  Final   Culture   Final    NO GROWTH 4 DAYS Performed at [facility], [address] [place], [place] [address]    Report Status PENDING  Incomplete   MRSA PCR Screening     Status: Abnormal   Collection Time: 08/24/16  7:22 PM  Result Value Ref Range Status   MRSA by PCR POSITIVE (A) NEGATIVE Final    Comment:        The GeneXpert MRSA Assay (FDA approved for NASAL specimens only), is one component of a comprehensive MRSA colonization surveillance program. It is not intended to diagnose MRSA infection nor to guide or monitor treatment for MRSA infections. RESULT CALLED TO, READ BACK BY AND VERIFIED WITH: MULLER,CINDY RN ON 08/25/16 AT 1615 EINGLE     Labs: BNP (last 3 results) No results for input(s): BNP in the last 8760 hours. Basic Metabolic Panel:  Recent Labs Lab 08/24/16 0605 08/25/16 0518 08/26/16 0520 08/27/16 0520 08/28/16 0507  NA 137 138 139 141 142  K 3.9 4.0 3.8 3.9 3.9  CL 96* 101 103 104 105  CO2 33* 30 30 30 29   GLUCOSE 109* 93 93 115* 119*  BUN 22* 16 19 20  25*  CREATININE 0.89 0.75 0.75 0.94 0.88  CALCIUM 8.5* 8.1* 8.0* 8.4* 8.6*   Liver Function Tests:  Recent Labs Lab 08/24/16 0605  AST 34  ALT 33  ALKPHOS 67  BILITOT 0.4  PROT 7.4  ALBUMIN 3.3*   No results for input(s): LIPASE, AMYLASE in the last 168 hours. No results for input(s): AMMONIA in the last 168 hours. CBC:  Recent Labs Lab 08/24/16 [phone] 08/25/16 0518  08/26/16 0520 08/27/16 0520 08/28/16 0507  WBC 8.8 7.1 4.7 4.5 5.0  NEUTROABS 6.5  --  3.0  --   --   HGB 9.4* 8.2* 7.4* 8.0* 7.9*  HCT 29.6* 27.1* 23.9* 25.6* 25.8*  MCV 67.6* 68.1* 68.9* 70.3* 69.7*  PLT 222 184 163 210 241   Cardiac Enzymes: No results for input(s): CKTOTAL, CKMB, CKMBINDEX, TROPONINI in the last 168 hours. BNP: Invalid input(s): POCBNP CBG: No results for input(s): GLUCAP in the last 168 hours. D-Dimer No results for input(s): DDIMER in the last 72 hours. Hgb A1c No results for input(s): HGBA1C in the last 72 hours. Lipid Profile No results for input(s): CHOL, HDL, LDLCALC, TRIG, CHOLHDL, LDLDIRECT in the last 72 hours. Thyroid  function studies No results for input(s): TSH, T4TOTAL, T3FREE, THYROIDAB in the last 72 hours.  Invalid input(s): FREET3 Anemia work up No results for input(s): VITAMINB12, FOLATE, FERRITIN, TIBC, IRON, RETICCTPCT in the last 72 hours. Urinalysis    Component Value Date/Time   COLORURINE YELLOW 10/24/2013 0120   APPEARANCEUR CLEAR 10/24/2013 0120   LABSPEC 1.005 10/24/2013 0120   PHURINE 6.5 10/24/2013 0120   GLUCOSEU NEGATIVE 10/24/2013 0120   HGBUR NEGATIVE 10/24/2013 0120   BILIRUBINUR NEGATIVE 10/24/2013 0120   KETONESUR NEGATIVE 10/24/2013 0120   PROTEINUR NEGATIVE 10/24/2013 0120   UROBILINOGEN 0.2 10/24/2013 0120   NITRITE NEGATIVE 10/24/2013 0120   LEUKOCYTESUR NEGATIVE 10/24/2013 0120   Sepsis Labs Invalid input(s): PROCALCITONIN,  WBC,  LACTICIDVEN Microbiology Recent Results (from the past 240 hour(s))  Gastrointestinal Panel by PCR , Stool     Status: Abnormal   Collection Time: 08/24/16  8:49 AM  Result Value Ref Range Status   Campylobacter species NOT DETECTED NOT DETECTED Final   Plesimonas shigelloides NOT DETECTED NOT DETECTED Final   Salmonella species NOT DETECTED NOT DETECTED Final   Yersinia enterocolitica NOT DETECTED NOT DETECTED Final   Vibrio species NOT DETECTED NOT DETECTED Final   Vibrio cholerae NOT DETECTED NOT DETECTED Final   Enteroaggregative E coli (EAEC) NOT DETECTED NOT DETECTED Final   Enteropathogenic E coli (EPEC) DETECTED (A) NOT DETECTED Final    Comment: RESULT CALLED TO, READ BACK BY AND VERIFIED WITH: Ivan Croft @ 1610 08/24/16 by TCH RESULT CALLED TO, READ BACK BY AND VERIFIED WITH: L.OWEN RN AT 1840 ON 08/24/16 BY S.VANHOORNE MLS    Enterotoxigenic E coli (ETEC) NOT DETECTED NOT DETECTED Final   Shiga like toxin producing E coli (STEC) NOT DETECTED NOT DETECTED Final   Shigella/Enteroinvasive E coli (EIEC) NOT DETECTED NOT DETECTED Final   Cryptosporidium NOT DETECTED NOT DETECTED Final   Cyclospora cayetanensis NOT  DETECTED NOT DETECTED Final   Entamoeba histolytica NOT DETECTED NOT DETECTED Final   Giardia lamblia NOT DETECTED NOT DETECTED Final   Adenovirus F40/41 NOT DETECTED NOT DETECTED Final   Astrovirus NOT DETECTED NOT DETECTED Final   Norovirus GI/GII NOT DETECTED NOT DETECTED Final   Rotavirus A NOT DETECTED NOT DETECTED Final   Sapovirus (I, II, IV, and V) NOT DETECTED NOT DETECTED Final  Body fluid culture     Status: None   Collection Time: 08/24/16  9:49 AM  Result Value Ref Range Status   Specimen Description KNEE RIGHT  Final   Special Requests ONLY AEROBIC SWAB SENT CANNOT DO ANAEROBIC PLATE  Final   Gram Stain   Final    NO ORGANISMS SEEN WBC PRESENT,BOTH PMN AND MONONUCLEAR CYTOSPIN Gram Stain Report Called to,Read Back By and Verified With:  MARY ONEILL AT 1300 ON 07.14.2018 BY NBROOKS    Culture   Final    NO GROWTH 3 DAYS Performed at [facility], [address] [place], [place] [address]    Report Status 08/28/2016 FINAL  Final  Culture, blood (routine x 2)     Status: None (Preliminary result)   Collection Time: 08/24/16 11:29 AM  Result Value Ref Range Status   Specimen Description BLOOD RIGHT ANTECUBITAL  Final   Special Requests IN PEDIATRIC BOTTLE Blood Culture adequate volume  Final   Culture   Final    NO GROWTH 4 DAYS Performed at [facility], [address] [name], [place] [address]    Report Status PENDING  Incomplete  Culture, blood (routine x 2)     Status: None (Preliminary result)   Collection Time: 08/24/16 11:29 AM  Result Value Ref Range Status   Specimen Description BLOOD LEFT ANTECUBITAL  Final   Special Requests IN PEDIATRIC BOTTLE Blood Culture adequate volume  Final   Culture   Final    NO GROWTH 4 DAYS Performed at [facility], [address] [place], [place] [address]    Report Status PENDING  Incomplete  MRSA PCR Screening     Status: Abnormal   Collection Time: 08/24/16  7:22 PM  Result Value Ref Range Status    MRSA by PCR POSITIVE (A) NEGATIVE Final    Comment:        The GeneXpert MRSA Assay (FDA approved for NASAL specimens only), is one component of a comprehensive MRSA colonization surveillance program. It is not intended to diagnose MRSA infection nor to guide or monitor treatment for MRSA infections. RESULT CALLED TO, READ BACK BY AND VERIFIED WITH: MULLER,CINDY RN ON 08/25/16 AT 1615 EINGLE    Time coordinating discharge: 35 minutes  SIGNED:  Merlene Laughter, DO [facility] 08/28/2016, 4:46 PM Pager [phone]  If 7PM-7AM, please contact night-coverage [email or web address] Password [email or web address]

## 2016-08-28 NOTE — Evaluation (Signed)
Physical Therapy Evaluation Patient Details Name: SOO STEELMAN MRN: [id number] DOB: May 12, 1980 Today's Date: 08/28/2016   History of Present Illness  admitted for pain knee due to Right knee prepatellar bursitis-   Clinical Impression  Patient is ambulating With Rw modified independent.  To get a  RW. No further PT needs. To DC today.  Follow Up Recommendations No PT follow up    Equipment Recommendations  Rolling walker with 5" wheels    Recommendations for Other Services       Precautions / Restrictions Precautions Precautions: Fall      Mobility  Bed Mobility                  Transfers Overall transfer level: Modified independent                  Ambulation/Gait Ambulation/Gait assistance: Modified independent (Device/Increase time) Ambulation Distance (Feet): 250 Feet Assistive device: Rolling walker (2 wheeled) Gait Pattern/deviations: Step-to pattern;Step-through pattern        Stairs            Wheelchair Mobility    Modified Rankin (Stroke Patients Only)       Balance                                             Pertinent Vitals/Pain Pain Assessment: 0-10 Pain Score: 6  Pain Location: right knee Pain Descriptors / Indicators: Aching Pain Intervention(s): Patient requesting pain meds-RN notified    Home Living Family/patient expects to be discharged to:: Private residence Living Arrangements: Other relatives   Type of Home: House Home Access: Ramped entrance       Home Equipment: None      Prior Function Level of Independence: Independent               Hand Dominance        Extremity/Trunk Assessment   Upper Extremity Assessment Upper Extremity Assessment: Overall WFL for tasks assessed    Lower Extremity Assessment Lower Extremity Assessment: RLE deficits/detail RLE Deficits / Details: bears weight    Cervical / Trunk Assessment Cervical / Trunk Assessment: Normal   Communication   Communication: No difficulties  Cognition Arousal/Alertness: Awake/alert Behavior During Therapy: WFL for tasks assessed/performed Overall Cognitive Status: Within Functional Limits for tasks assessed                                        General Comments      Exercises     Assessment/Plan    PT Assessment Patent does not need any further PT services  PT Problem List         PT Treatment Interventions      PT Goals (Current goals can be found in the Care Plan section)  Acute Rehab PT Goals Patient Stated Goal: go home PT Goal Formulation: All assessment and education complete, DC therapy    Frequency     Barriers to discharge        Co-evaluation               AM-PAC PT "6 Clicks" Daily Activity  Outcome Measure Difficulty turning over in bed (including adjusting bedclothes, sheets and blankets)?: None Difficulty moving from lying on back to sitting on the side of the bed? :  None Difficulty sitting down on and standing up from a chair with arms (e.g., wheelchair, bedside commode, etc,.)?: Total Help needed moving to and from a bed to chair (including a wheelchair)?: A Little Help needed walking in hospital room?: A Little   6 Click Score: 15    End of Session   Activity Tolerance: Patient tolerated treatment well Patient left: in bed Nurse Communication: Mobility status PT Visit Diagnosis: Difficulty in walking, not elsewhere classified (R26.2);Pain Pain - Right/Left: Right Pain - part of body: Knee    Time: [id number] PT Time Calculation (min) (ACUTE ONLY): 17 min   Charges:   PT Evaluation $PT Eval Low Complexity: 1 Procedure     PT G CodesRada Hay:          Jamilett Ferrante Elizabeth 08/28/2016, 4:58 PM

## 2016-08-29 LAB — CULTURE, BLOOD (ROUTINE X 2)
Culture: NO GROWTH
Culture: NO GROWTH
Special Requests: ADEQUATE
Special Requests: ADEQUATE

## 2016-09-04 ENCOUNTER — Inpatient Hospital Stay (HOSPITAL_COMMUNITY): Payer: Medicaid Other

## 2016-09-04 ENCOUNTER — Emergency Department (HOSPITAL_COMMUNITY): Admit: 2016-09-04 | Discharge: 2016-09-04 | Disposition: A | Discharge status: Home / Self Care | Payer: Medicaid Other

## 2016-09-04 ENCOUNTER — Emergency Department (HOSPITAL_COMMUNITY): Payer: Medicaid Other

## 2016-09-04 ENCOUNTER — Inpatient Hospital Stay (HOSPITAL_COMMUNITY)
Admission: EM | Admit: 2016-09-04 | Discharge: 2016-09-06 | DRG: 871 | Disposition: A | Discharge status: Home / Self Care | Payer: Medicaid Other | Attending: Internal Medicine | Admitting: Internal Medicine

## 2016-09-04 ENCOUNTER — Encounter (HOSPITAL_COMMUNITY): Payer: Self-pay

## 2016-09-04 DIAGNOSIS — A419 Sepsis, unspecified organism: Secondary | ICD-10-CM | POA: Diagnosis present

## 2016-09-04 DIAGNOSIS — Z888 Allergy status to other drugs, medicaments and biological substances status: Secondary | ICD-10-CM

## 2016-09-04 DIAGNOSIS — D638 Anemia in other chronic diseases classified elsewhere: Secondary | ICD-10-CM | POA: Diagnosis present

## 2016-09-04 DIAGNOSIS — F17210 Nicotine dependence, cigarettes, uncomplicated: Secondary | ICD-10-CM | POA: Diagnosis present

## 2016-09-04 DIAGNOSIS — L539 Erythematous condition, unspecified: Secondary | ICD-10-CM | POA: Diagnosis present

## 2016-09-04 DIAGNOSIS — M009 Pyogenic arthritis, unspecified: Secondary | ICD-10-CM | POA: Diagnosis not present

## 2016-09-04 DIAGNOSIS — I959 Hypotension, unspecified: Secondary | ICD-10-CM | POA: Diagnosis present

## 2016-09-04 DIAGNOSIS — K7200 Acute and subacute hepatic failure without coma: Secondary | ICD-10-CM | POA: Diagnosis present

## 2016-09-04 DIAGNOSIS — G8929 Other chronic pain: Secondary | ICD-10-CM | POA: Diagnosis present

## 2016-09-04 DIAGNOSIS — E43 Unspecified severe protein-calorie malnutrition: Secondary | ICD-10-CM | POA: Diagnosis present

## 2016-09-04 DIAGNOSIS — F319 Bipolar disorder, unspecified: Secondary | ICD-10-CM | POA: Diagnosis present

## 2016-09-04 DIAGNOSIS — G9349 Other encephalopathy: Secondary | ICD-10-CM | POA: Diagnosis present

## 2016-09-04 DIAGNOSIS — I82409 Acute embolism and thrombosis of unspecified deep veins of unspecified lower extremity: Secondary | ICD-10-CM | POA: Diagnosis not present

## 2016-09-04 DIAGNOSIS — T68XXXA Hypothermia, initial encounter: Secondary | ICD-10-CM | POA: Diagnosis present

## 2016-09-04 DIAGNOSIS — L03115 Cellulitis of right lower limb: Secondary | ICD-10-CM | POA: Diagnosis not present

## 2016-09-04 DIAGNOSIS — Z881 Allergy status to other antibiotic agents status: Secondary | ICD-10-CM | POA: Diagnosis not present

## 2016-09-04 DIAGNOSIS — I824Z1 Acute embolism and thrombosis of unspecified deep veins of right distal lower extremity: Secondary | ICD-10-CM | POA: Diagnosis present

## 2016-09-04 DIAGNOSIS — R569 Unspecified convulsions: Secondary | ICD-10-CM | POA: Diagnosis present

## 2016-09-04 DIAGNOSIS — N179 Acute kidney failure, unspecified: Secondary | ICD-10-CM | POA: Diagnosis present

## 2016-09-04 DIAGNOSIS — M1990 Unspecified osteoarthritis, unspecified site: Secondary | ICD-10-CM | POA: Diagnosis present

## 2016-09-04 DIAGNOSIS — F1120 Opioid dependence, uncomplicated: Secondary | ICD-10-CM | POA: Diagnosis present

## 2016-09-04 DIAGNOSIS — K7682 Hepatic encephalopathy: Secondary | ICD-10-CM

## 2016-09-04 DIAGNOSIS — M79609 Pain in unspecified limb: Secondary | ICD-10-CM

## 2016-09-04 DIAGNOSIS — B9562 Methicillin resistant Staphylococcus aureus infection as the cause of diseases classified elsewhere: Secondary | ICD-10-CM | POA: Diagnosis present

## 2016-09-04 DIAGNOSIS — F329 Major depressive disorder, single episode, unspecified: Secondary | ICD-10-CM | POA: Diagnosis present

## 2016-09-04 DIAGNOSIS — F1410 Cocaine abuse, uncomplicated: Secondary | ICD-10-CM | POA: Diagnosis present

## 2016-09-04 DIAGNOSIS — M7989 Other specified soft tissue disorders: Secondary | ICD-10-CM

## 2016-09-04 DIAGNOSIS — Z880 Allergy status to penicillin: Secondary | ICD-10-CM

## 2016-09-04 DIAGNOSIS — L8990 Pressure ulcer of unspecified site, unspecified stage: Secondary | ICD-10-CM | POA: Insufficient documentation

## 2016-09-04 DIAGNOSIS — Z79899 Other long term (current) drug therapy: Secondary | ICD-10-CM

## 2016-09-04 LAB — COMPREHENSIVE METABOLIC PANEL
ALBUMIN: 3.4 g/dL — AB (ref 3.5–5.0)
ALT: 12 U/L — ABNORMAL LOW (ref 14–54)
ANION GAP: 8 (ref 5–15)
AST: 17 U/L (ref 15–41)
Alkaline Phosphatase: 62 U/L (ref 38–126)
BUN: 20 mg/dL (ref 6–20)
CHLORIDE: 99 mmol/L — AB (ref 101–111)
CO2: 29 mmol/L (ref 22–32)
Calcium: 9.3 mg/dL (ref 8.9–10.3)
Creatinine, Ser: 1.06 mg/dL — ABNORMAL HIGH (ref 0.44–1.00)
GFR calc Af Amer: >60 mL/min (ref >60)
GFR calc non Af Amer: >60 mL/min (ref >60)
GLUCOSE: 84 mg/dL (ref 65–99)
POTASSIUM: 4.9 mmol/L (ref 3.5–5.1)
Sodium: 136 mmol/L (ref 135–145)
TOTAL PROTEIN: 7.3 g/dL (ref 6.5–8.1)
Total Bilirubin: 0.3 mg/dL (ref 0.3–1.2)

## 2016-09-04 LAB — CBC WITH DIFFERENTIAL/PLATELET
BASOS PCT: 1 %
Basophils Absolute: 0.1 10*3/uL (ref 0.0–0.1)
Eosinophils Absolute: 0 10*3/uL (ref 0.0–0.7)
Eosinophils Relative: 0 %
HCT: 29.9 % — ABNORMAL LOW (ref 36.0–46.0)
HEMOGLOBIN: 9.5 g/dL — AB (ref 12.0–15.0)
LYMPHS PCT: 31 %
Lymphs Abs: 1.6 10*3/uL (ref 0.7–4.0)
MCH: 21.2 pg — AB (ref 26.0–34.0)
MCHC: 31.8 g/dL (ref 30.0–36.0)
MCV: 66.6 fL — AB (ref 78.0–100.0)
Monocytes Absolute: 0.2 10*3/uL (ref 0.1–1.0)
Monocytes Relative: 4 %
NEUTROS PCT: 64 %
Neutro Abs: 3.4 10*3/uL (ref 1.7–7.7)
Platelets: 332 10*3/uL (ref 150–400)
RBC: 4.49 MIL/uL (ref 3.87–5.11)
RDW: 19.8 % — ABNORMAL HIGH (ref 11.5–15.5)
WBC: 5.3 10*3/uL (ref 4.0–10.5)

## 2016-09-04 LAB — POC URINE PREG, ED: Preg Test, Ur: NEGATIVE

## 2016-09-04 LAB — URINALYSIS, ROUTINE W REFLEX MICROSCOPIC
BACTERIA UA: NONE SEEN
BILIRUBIN URINE: NEGATIVE
Glucose, UA: NEGATIVE mg/dL
Hgb urine dipstick: NEGATIVE
KETONES UR: NEGATIVE mg/dL
NITRITE: NEGATIVE
PROTEIN: NEGATIVE mg/dL
Specific Gravity, Urine: 1.005 (ref 1.005–1.030)
pH: 8 (ref 5.0–8.0)

## 2016-09-04 LAB — RAPID URINE DRUG SCREEN, HOSP PERFORMED
AMPHETAMINES: NOT DETECTED
BENZODIAZEPINES: NOT DETECTED
Barbiturates: NOT DETECTED
Cocaine: NOT DETECTED
OPIATES: POSITIVE — AB
TETRAHYDROCANNABINOL: NOT DETECTED

## 2016-09-04 LAB — I-STAT CHEM 8, ED
BUN: 19 mg/dL (ref 6–20)
CALCIUM ION: 1.2 mmol/L (ref 1.15–1.40)
CHLORIDE: 98 mmol/L — AB (ref 101–111)
Creatinine, Ser: 1.1 mg/dL — ABNORMAL HIGH (ref 0.44–1.00)
GLUCOSE: 81 mg/dL (ref 65–99)
HCT: 30 % — ABNORMAL LOW (ref 36.0–46.0)
Hemoglobin: 10.2 g/dL — ABNORMAL LOW (ref 12.0–15.0)
Potassium: 4.9 mmol/L (ref 3.5–5.1)
Sodium: 135 mmol/L (ref 135–145)
TCO2: 27 mmol/L (ref 0–100)

## 2016-09-04 LAB — I-STAT CG4 LACTIC ACID, ED: Lactic Acid, Venous: 0.68 mmol/L (ref 0.5–1.9)

## 2016-09-04 MED ORDER — SODIUM CHLORIDE 0.9 % IV BOLUS (SEPSIS)
1000.0000 mL | Freq: Once | INTRAVENOUS | Status: AC
  Administered 2016-09-04: 1000 mL via INTRAVENOUS

## 2016-09-04 MED ORDER — SODIUM CHLORIDE 0.9% FLUSH
3.0000 mL | Freq: Two times a day (BID) | INTRAVENOUS | Status: DC
  Administered 2016-09-04 (×2): 3 mL via INTRAVENOUS

## 2016-09-04 MED ORDER — PIPERACILLIN-TAZOBACTAM 3.375 G IVPB
3.3750 g | Freq: Three times a day (TID) | INTRAVENOUS | Status: DC
  Administered 2016-09-04 – 2016-09-05 (×3): 3.375 g via INTRAVENOUS
  Filled 2016-09-04 (×6): qty 50

## 2016-09-04 MED ORDER — BUPRENORPHINE HCL-NALOXONE HCL 8-2 MG SL SUBL
1.0000 | SUBLINGUAL_TABLET | Freq: Three times a day (TID) | SUBLINGUAL | Status: DC
  Administered 2016-09-04 – 2016-09-06 (×5): 1 via SUBLINGUAL
  Filled 2016-09-04 (×5): qty 1

## 2016-09-04 MED ORDER — CLONAZEPAM 0.5 MG PO TABS
0.2500 mg | ORAL_TABLET | Freq: Three times a day (TID) | ORAL | Status: DC | PRN
  Administered 2016-09-04 – 2016-09-06 (×6): 0.25 mg via ORAL
  Filled 2016-09-04 (×6): qty 1

## 2016-09-04 MED ORDER — OXYCODONE HCL 5 MG PO TABS
5.0000 mg | ORAL_TABLET | Freq: Four times a day (QID) | ORAL | Status: DC | PRN
  Administered 2016-09-04 (×2): 5 mg via ORAL
  Filled 2016-09-04 (×2): qty 1

## 2016-09-04 MED ORDER — OXYCODONE HCL 5 MG PO TABS
10.0000 mg | ORAL_TABLET | ORAL | Status: AC
  Administered 2016-09-04: 10 mg via ORAL
  Filled 2016-09-04: qty 2

## 2016-09-04 MED ORDER — LEVETIRACETAM 500 MG PO TABS
1000.0000 mg | ORAL_TABLET | Freq: Two times a day (BID) | ORAL | Status: DC
  Administered 2016-09-04 – 2016-09-06 (×4): 1000 mg via ORAL
  Filled 2016-09-04 (×4): qty 2

## 2016-09-04 MED ORDER — OXYCODONE HCL 5 MG PO TABS
5.0000 mg | ORAL_TABLET | ORAL | Status: AC | PRN
  Administered 2016-09-04: 5 mg via ORAL
  Administered 2016-09-05: 10 mg via ORAL
  Filled 2016-09-04: qty 1
  Filled 2016-09-04: qty 2

## 2016-09-04 MED ORDER — ENOXAPARIN SODIUM 30 MG/0.3ML ~~LOC~~ SOLN: 30.0000 mg | SUBCUTANEOUS | Status: DC

## 2016-09-04 MED ORDER — SODIUM CHLORIDE 0.9 % IV SOLN
INTRAVENOUS | Status: DC
  Administered 2016-09-05 (×2): via INTRAVENOUS

## 2016-09-04 MED ORDER — SODIUM CHLORIDE 0.9 % IV SOLN
INTRAVENOUS | Status: DC
  Administered 2016-09-04: 13:00:00 via INTRAVENOUS

## 2016-09-04 MED ORDER — VANCOMYCIN HCL IN DEXTROSE 750-5 MG/150ML-% IV SOLN
750.0000 mg | Freq: Two times a day (BID) | INTRAVENOUS | Status: DC
  Administered 2016-09-04 – 2016-09-05 (×2): 750 mg via INTRAVENOUS
  Filled 2016-09-04 (×3): qty 150

## 2016-09-04 MED ORDER — ONDANSETRON HCL 4 MG/2ML IJ SOLN: 4.0000 mg | Freq: Four times a day (QID) | INTRAMUSCULAR | Status: DC | PRN

## 2016-09-04 MED ORDER — ONDANSETRON HCL 4 MG PO TABS: 4.0000 mg | ORAL_TABLET | Freq: Four times a day (QID) | ORAL | Status: DC | PRN

## 2016-09-04 MED ORDER — VANCOMYCIN HCL IN DEXTROSE 1-5 GM/200ML-% IV SOLN
1000.0000 mg | Freq: Once | INTRAVENOUS | Status: AC
  Administered 2016-09-04: 1000 mg via INTRAVENOUS
  Filled 2016-09-04: qty 200

## 2016-09-04 MED ORDER — DOCUSATE SODIUM 100 MG PO CAPS
100.0000 mg | ORAL_CAPSULE | Freq: Two times a day (BID) | ORAL | Status: DC
  Administered 2016-09-04: 100 mg via ORAL
  Filled 2016-09-04 (×2): qty 1

## 2016-09-04 MED ORDER — ENOXAPARIN SODIUM 40 MG/0.4ML ~~LOC~~ SOLN
1.0000 mg/kg | SUBCUTANEOUS | Status: AC
  Filled 2016-09-04: qty 0.4

## 2016-09-04 MED ORDER — IPRATROPIUM-ALBUTEROL 0.5-2.5 (3) MG/3ML IN SOLN: 3.0000 mL | Freq: Four times a day (QID) | RESPIRATORY_TRACT | Status: DC | PRN

## 2016-09-04 MED ORDER — ENOXAPARIN SODIUM 40 MG/0.4ML ~~LOC~~ SOLN
1.0000 mg/kg | Freq: Two times a day (BID) | SUBCUTANEOUS | Status: DC
  Administered 2016-09-04: 40 mg via SUBCUTANEOUS
  Filled 2016-09-04 (×2): qty 0.4

## 2016-09-04 MED ORDER — PIPERACILLIN-TAZOBACTAM 3.375 G IVPB 30 MIN
3.3750 g | Freq: Once | INTRAVENOUS | Status: AC
  Administered 2016-09-04: 3.375 g via INTRAVENOUS
  Filled 2016-09-04: qty 50

## 2016-09-04 MED ORDER — KETOROLAC TROMETHAMINE 15 MG/ML IJ SOLN
15.0000 mg | Freq: Four times a day (QID) | INTRAMUSCULAR | Status: DC | PRN
  Administered 2016-09-04 – 2016-09-06 (×7): 15 mg via INTRAVENOUS
  Filled 2016-09-04 (×8): qty 1

## 2016-09-04 MED ORDER — BISACODYL 5 MG PO TBEC: 5.0000 mg | DELAYED_RELEASE_TABLET | Freq: Every day | ORAL | Status: DC | PRN

## 2016-09-04 NOTE — Consult Note (Signed)
Reason for Consult: Right leg swelling  Referring Physician: EDP  Chestine Belknap Laakso is an 36 y.o. female.  HPI: 36yo d/c'd last week on Bactrim for prepatellar bursitis septic.Joint tap negative. Readmitted for leg swelling.   Past Medical History:  Diagnosis Date  . Bipolar 1 disorder ([place])   . Depression   . Herpes   . Seizures ([place])     Past Surgical History:  Procedure Laterality Date  . CESAREAN SECTION      Family History  Problem Relation Age of Onset  . Diabetes Sister     Social History:  reports that she has been smoking Cigarettes.  She has been smoking about 2.00 packs per day. She has never used smokeless tobacco. She reports that she does not drink alcohol or use drugs.  Allergies:  Allergies  Allergen Reactions  . Quetiapine Other (See Comments)    Severe muscle spasms and hallucinations  . Tramadol Other (See Comments)    Muscle spasms, seizure and hallucinations  . Penicillins     Medications: I have reviewed the patient's current medications.  Results for orders placed or performed during the hospital encounter of 09/04/16 (from the past 48 hour(s))  CBC with Differential/Platelet     Status: Abnormal   Collection Time: 09/04/16  6:35 AM  Result Value Ref Range   WBC 5.3 4.0 - 10.5 K/uL   RBC 4.49 3.87 - 5.11 MIL/uL   Hemoglobin 9.5 (L) 12.0 - 15.0 g/dL   HCT 29.9 (L) 36.0 - 46.0 %   MCV 66.6 (L) 78.0 - 100.0 fL   MCH 21.2 (L) 26.0 - 34.0 pg   MCHC 31.8 30.0 - 36.0 g/dL   RDW 19.8 (H) 11.5 - 15.5 %   Platelets 332 150 - 400 K/uL   Neutrophils Relative % 64 %   Lymphocytes Relative 31 %   Monocytes Relative 4 %   Eosinophils Relative 0 %   Basophils Relative 1 %   Neutro Abs 3.4 1.7 - 7.7 K/uL   Lymphs Abs 1.6 0.7 - 4.0 K/uL   Monocytes Absolute 0.2 0.1 - 1.0 K/uL   Eosinophils Absolute 0.0 0.0 - 0.7 K/uL   Basophils Absolute 0.1 0.0 - 0.1 K/uL   RBC Morphology TARGET CELLS   Comprehensive metabolic panel     Status: Abnormal   Collection  Time: 09/04/16  6:35 AM  Result Value Ref Range   Sodium 136 135 - 145 mmol/L   Potassium 4.9 3.5 - 5.1 mmol/L   Chloride 99 (L) 101 - 111 mmol/L   CO2 29 22 - 32 mmol/L   Glucose, Bld 84 65 - 99 mg/dL   BUN 20 6 - 20 mg/dL   Creatinine, Ser 1.06 (H) 0.44 - 1.00 mg/dL   Calcium 9.3 8.9 - 10.3 mg/dL   Total Protein 7.3 6.5 - 8.1 g/dL   Albumin 3.4 (L) 3.5 - 5.0 g/dL   AST 17 15 - 41 U/L   ALT 12 (L) 14 - 54 U/L   Alkaline Phosphatase 62 38 - 126 U/L   Total Bilirubin 0.3 0.3 - 1.2 mg/dL   GFR calc non Af Amer >60 >60 mL/min   GFR calc Af Amer >60 >60 mL/min    Comment: (NOTE) The eGFR has been calculated using the CKD EPI equation. This calculation has not been validated in all clinical situations. eGFR's persistently <60 mL/min signify possible Chronic Kidney Disease.    Anion gap 8 5 - 15  I-Stat Chem 8, ED  Status: Abnormal   Collection Time: 09/04/16  6:48 AM  Result Value Ref Range   Sodium 135 135 - 145 mmol/L   Potassium 4.9 3.5 - 5.1 mmol/L   Chloride 98 (L) 101 - 111 mmol/L   BUN 19 6 - 20 mg/dL   Creatinine, Ser 1.10 (H) 0.44 - 1.00 mg/dL   Glucose, Bld 81 65 - 99 mg/dL   Calcium, Ion 1.20 1.15 - 1.40 mmol/L   TCO2 27 0 - 100 mmol/L   Hemoglobin 10.2 (L) 12.0 - 15.0 g/dL   HCT 30.0 (L) 36.0 - 46.0 %  I-Stat CG4 Lactic Acid, ED  (not at  [facility])     Status: None   Collection Time: 09/04/16  6:49 AM  Result Value Ref Range   Lactic Acid, Venous 0.68 0.5 - 1.9 mmol/L    Dg Chest 2 View  Result Date: 09/04/2016 CLINICAL DATA:  Mild hypoxia. EXAM: CHEST  2 VIEW COMPARISON:  04/08/2016 FINDINGS: The lungs are clear. The pulmonary vasculature is normal. Heart size is normal. Hilar and mediastinal contours are unremarkable. There is no pleural effusion. IMPRESSION: No active cardiopulmonary disease. Electronically Signed   By: Andreas Newport M.D.   On: 09/04/2016 07:03   Dg Knee Complete 4 Views Right  Result Date: 09/04/2016 CLINICAL DATA:  Continued right knee  swelling and pain. EXAM: RIGHT KNEE - COMPLETE 4+ VIEW COMPARISON:  08/24/2016. FINDINGS: Persistent soft tissue swelling over the anterior knee. Tiny knee joint effusion cannot be excluded. No associated bony abnormality identified. No evidence fracture dislocation . IMPRESSION: Persistent soft tissue swelling over the anterior knee. Tiny knee joint effusion cannot be excluded. No acute or focal bony abnormality identified. MRI of the right knee may prove useful for further evaluation . Electronically Signed   By: Marcello Moores  Register   On: 09/04/2016 06:13    Review of Systems  Constitutional: Positive for malaise/fatigue.  Cardiovascular: Positive for leg swelling.  Musculoskeletal: Positive for joint pain.  All other systems reviewed and are negative.  Blood pressure 90/68, pulse 70, temperature (!) 96.9 F (36.1 C), temperature source Rectal, resp. rate 16, last menstrual period 08/28/2016, SpO2 93 %. Physical Exam  HENT:  Head: Normocephalic and atraumatic.  Eyes: Pupils are equal, round, and reactive to light.  Neck: Neck supple.  Cardiovascular: Normal rate.   Respiratory: Effort normal.  GI: Soft.  Musculoskeletal:  Prepatellar swelling mild erythema min fluid. Full range knee minimal pain. No effusion. Leg swelling. Calf pain. Compartments soft.  Neurological: She is alert.  Skin: Skin is warm and dry. There is erythema.  Psychiatric:  Affect blunt    Assessment/Plan:  Discussed with Dr. Maureen Ralphs Prepatellar bursa improved from last week. No evidence of septic knee. Has sx DVT and probable cellulitis. Agree with admit, Ab and tx for DVT. No indication for surgery. Plan F/U with Dr. Elmyra Ricks as scheduled. Please contact us if knee bursa changes. Thank You     Shanetra Blumenstock C 09/04/2016, 10:11 AM  [phone]

## 2016-09-04 NOTE — H&P (Signed)
History and Physical    Julie Sanford HQI:[id number] DOB: October 29, 1980 DOA: 09/04/2016  PCP: Default, Provider, MD Patient coming from: Home  I have personally briefly reviewed patient's old medical records in [facility]  Chief Complaint: Worsening right knee redness and pain  HPI: Julie FriesChristina M Sanford is a 36 y.o. female with medical history significant of Bipolar, seizure, Substance  abuse, recently discharge from hospital on 7-18 with diagnosis of Right knee patellar bursitis. She underwent arthrocentesis, which was not consistent with septic arthritis. She was treated with IV antibiotics initially and discharge home on Bactrim. She was evaluated on that admission by ortho and ID. Patient presents today complaining of worsening right knee pain edema, redness, this started 2 days after discharge. She has been taking bactrim, she have 2 left. She notice edema and redness spreading to her leg.  On my evaluation she is very sleepy, she keep eyes close, she answer some questions. She denies using drugs recently.   ED Course: Patient was found to have blood pressure at 88 /57, temp 96.9, white blood cell 5.3, hemoglobin 9.5 creatinine 1.06, lactic acid 0.68. Chest x-ray; no active cardiopulmonary disease. Right knee x-ray; Persistent soft tissue swelling over the anterior knee. Tiny knee joint effusion cannot be excluded. No acute or focal bony abnormality identified. MRI of the right knee may prove useful for further evaluation .  Review of Systems: Limited due to alter mental status   Past Medical History:  Diagnosis Date  . Bipolar 1 disorder ([facility])   . Depression   . Herpes   . Seizures ([facility])     Past Surgical History:  Procedure Laterality Date  . CESAREAN SECTION       reports that she has been smoking Cigarettes.  She has been smoking about 2.00 packs per day. She has never used smokeless tobacco. She reports that she does not drink alcohol or use drugs.  Allergies  Allergen  Reactions  . Quetiapine Other (See Comments)    Severe muscle spasms and hallucinations  . Tramadol Other (See Comments)    Muscle spasms, seizure and hallucinations  . Penicillins     Family History  Problem Relation Age of Onset  . Diabetes Sister      Prior to Admission medications   Medication Sig Start Date End Date Taking? Authorizing Provider  baclofen (LIORESAL) 10 MG tablet Take 10 mg by mouth 3 (three) times daily. 07/31/16  Yes [provider]  clonazePAM (KLONOPIN) 0.5 MG tablet Take 0.25 mg by mouth every 8 (eight) hours. 08/17/16  Yes [provider]  gabapentin (NEURONTIN) 300 MG capsule Take 600 mg by mouth 4 (four) times daily - after meals and at bedtime. 08/15/16  Yes [provider]  hydrOXYzine (VISTARIL) 50 MG capsule Take 50-100 mg by mouth 3 (three) times daily as needed for anxiety or itching.  07/13/16  Yes [provider]  ipratropium-albuterol (DUONEB) 0.5-2.5 (3) MG/3ML SOLN Take 3 mLs by nebulization every 6 (six) hours as needed (sob, wheezing).  08/08/16  Yes [provider]  LATUDA 80 MG TABS tablet Take 80 mg by mouth at bedtime. 07/13/16  Yes [provider]  levETIRAcetam (KEPPRA) 500 MG tablet Take 1,000 mg by mouth 2 (two) times daily. 08/16/16  Yes [provider]  methocarbamol (ROBAXIN) 750 MG tablet Take 1,500 mg by mouth 4 (four) times daily as needed for muscle spasms.  08/10/16  Yes [provider]  PROAIR HFA 108 [phone](90 Base) MCG/ACT  inhaler Inhale 2 puffs into the lungs every 6 (six) hours as needed for wheezing or shortness of breath.  08/19/16  Yes [provider]  sertraline (ZOLOFT) 100 MG tablet Take 100 mg by mouth daily. 07/31/16  Yes [provider]  SUBOXONE 8-2 MG FILM Place 1 Film under the tongue 3 (three) times daily. 08/21/16  Yes [provider]  sulfamethoxazole-trimethoprim (BACTRIM DS,SEPTRA DS) 800-160 MG tablet Take 1 tablet by mouth 2 (two)  times daily. 08/28/16 09/11/16 Yes Sheikh, Omair Latif, DO  traZODone (DESYREL) 100 MG tablet Take 400 mg by mouth at bedtime. 08/13/16  Yes [provider]    Physical Exam: Vitals:   09/04/16 0541 09/04/16 0628 09/04/16 0701 09/04/16 0800  BP: 93/65 (!) 86/57 90/68   Pulse: 73 72 70   Resp: 18 16 16    Temp:    (!) 96.9 F (36.1 C)  TempSrc:    Rectal  SpO2: 92% 90% 93%     Constitutional: NAD, lethargic, cachetic Vitals:   09/04/16 0541 09/04/16 0628 09/04/16 0701 09/04/16 0800  BP: 93/65 (!) 86/57 90/68   Pulse: 73 72 70   Resp: 18 16 16    Temp:    (!) 96.9 F (36.1 C)  TempSrc:    Rectal  SpO2: 92% 90% 93%    Eyes: PERRL, lids and conjunctivae normal, periorbital edema ENMT: Mucous membranes are moist. Posterior pharynx clear of any exudate or lesions.Normal dentition.  Neck: normal, supple, no masses, no thyromegaly Respiratory: clear to auscultation bilaterally, no wheezing, no crackles. Normal respiratory effort. No accessory muscle use.  Cardiovascular: Regular rate and rhythm, no murmurs / rubs / gallops. No extremity edema. 2+ pedal pulses. No carotid bruits.  Abdomen: no tenderness, no masses palpated. No hepatosplenomegaly. Bowel sounds positive.  Musculoskeletal: no clubbing / cyanosis.  Skin: Right knee with edema, redness, round scar with yellow tissue, redness extend to right leg,  Neurologic: lethargic, keep eyes close, she sat t in the bed, with eyes close.  Psychiatric: unable to evaluate./     Labs on Admission: I have personally reviewed following labs and imaging studies  CBC:  Recent Labs Lab 09/04/16 0635 09/04/16 0648  WBC 5.3  --   NEUTROABS 3.4  --   HGB 9.5* 10.2*  HCT 29.9* 30.0*  MCV 66.6*  --   PLT 332  --    Basic Metabolic Panel:  Recent Labs Lab 09/04/16 0635 09/04/16 0648  NA 136 135  K 4.9 4.9  CL 99* 98*  CO2 29  --   GLUCOSE 84 81  BUN 20 19  CREATININE 1.06* 1.10*  CALCIUM 9.3  --    GFR: Estimated  Creatinine Clearance: 46.5 mL/min (A) (by C-G formula based on SCr of 1.1 mg/dL (H)). Liver Function Tests:  Recent Labs Lab 09/04/16 0635  AST 17  ALT 12*  ALKPHOS 62  BILITOT 0.3  PROT 7.3  ALBUMIN 3.4*   No results for input(s): LIPASE, AMYLASE in the last 168 hours. No results for input(s): AMMONIA in the last 168 hours. Coagulation Profile: No results for input(s): INR, PROTIME in the last 168 hours. Cardiac Enzymes: No results for input(s): CKTOTAL, CKMB, CKMBINDEX, TROPONINI in the last 168 hours. BNP (last 3 results) No results for input(s): PROBNP in the last 8760 hours. HbA1C: No results for input(s): HGBA1C in the last 72 hours. CBG: No results for input(s): GLUCAP in the last 168 hours. Lipid Profile: No results for input(s): CHOL, HDL, LDLCALC, TRIG,  CHOLHDL, LDLDIRECT in the last 72 hours. Thyroid Function Tests: No results for input(s): TSH, T4TOTAL, FREET4, T3FREE, THYROIDAB in the last 72 hours. Anemia Panel: No results for input(s): VITAMINB12, FOLATE, FERRITIN, TIBC, IRON, RETICCTPCT in the last 72 hours. Urine analysis:    Component Value Date/Time   COLORURINE YELLOW 10/24/2013 0120   APPEARANCEUR CLEAR 10/24/2013 0120   LABSPEC 1.005 10/24/2013 0120   PHURINE 6.5 10/24/2013 0120   GLUCOSEU NEGATIVE 10/24/2013 0120   HGBUR NEGATIVE 10/24/2013 0120   BILIRUBINUR NEGATIVE 10/24/2013 0120   KETONESUR NEGATIVE 10/24/2013 0120   PROTEINUR NEGATIVE 10/24/2013 0120   UROBILINOGEN 0.2 10/24/2013 0120   NITRITE NEGATIVE 10/24/2013 0120   LEUKOCYTESUR NEGATIVE 10/24/2013 0120    Radiological Exams on Admission: Dg Chest 2 View  Result Date: 09/04/2016 CLINICAL DATA:  Mild hypoxia. EXAM: CHEST  2 VIEW COMPARISON:  04/08/2016 FINDINGS: The lungs are clear. The pulmonary vasculature is normal. Heart size is normal. Hilar and mediastinal contours are unremarkable. There is no pleural effusion. IMPRESSION: No active cardiopulmonary disease. Electronically  Signed   By: Ellery Plunk M.D.   On: 09/04/2016 07:03   Dg Knee Complete 4 Views Right  Result Date: 09/04/2016 CLINICAL DATA:  Continued right knee swelling and pain. EXAM: RIGHT KNEE - COMPLETE 4+ VIEW COMPARISON:  08/24/2016. FINDINGS: Persistent soft tissue swelling over the anterior knee. Tiny knee joint effusion cannot be excluded. No associated bony abnormality identified. No evidence fracture dislocation . IMPRESSION: Persistent soft tissue swelling over the anterior knee. Tiny knee joint effusion cannot be excluded. No acute or focal bony abnormality identified. MRI of the right knee may prove useful for further evaluation . Electronically Signed   By: Maisie Fus  Register   On: 09/04/2016 06:13    EKG: Independently reviewed. 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 Fail Bactrim.  Admit to step down unit due to hypotension, knee infection.  IV fluids.  Continue with IV vancomycin and Zosyn started in the ED.  Will order MRI right knee.  Ortho consulted By ED PA. Await evaluation.   2-Early sepsis; presents with knee infection, hypotension, hypothermia;  Trend lactic acid.  IV fluids.  IV antibiotics.   3-Acute encephalopathy; patient lethargic, protecting airway.  Unclear if she was using drugs. Or might be related to infection.  Hold sedatives; Baclofen, Klonopin, latuda.   4-Chronic pain; hold sedatives due to AMS>  IV Toradol.   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  Wound care consulted.Marland Kitchen   6-Chronic anemia. Stable. Follow trend.    DVT prophylaxis: Lovenox Code Status: Full Code Family Communication: Care discussed with patient Disposition Plan: Suspect discharged home unless she requires IV antibiotics at discharge.  Consults called: Orthopedics will be consulted  by ED PA Admission status: Inpatient status, stepdown admission. She might require orthopedic procedure, if joint knee is considered to be infected   Hartley Barefoot A MD [facility] Pager [phone]  If 7PM-7AM, please contact night-coverage [email or web address] Password [facility]  09/04/2016, 10:32 AM

## 2016-09-04 NOTE — ED Notes (Signed)
Report given to ICU RN

## 2016-09-04 NOTE — Progress Notes (Signed)
**  Preliminary report by tech**  Right lower extremity venous duplex complete. There is evidence of age-indeterminate, non-occlusive deep vein thrombosis involving the gastrocnemius veins of the right lower extremity. There is no evidence of superficial vein thrombosis involving the right lower extremity.  There is no evidence of a Baker's cyst on the right. Results were given to Sharen Hecklaudia Gibbons PA.  09/04/16 8:50 AM Olen CordialGreg Ellinor Test RVT

## 2016-09-04 NOTE — ED Provider Notes (Signed)
[facility]-EMERGENCY DEPT Provider Note   CSN: [id number] Arrival date & time: 09/04/16  0221     History   Chief Complaint Chief Complaint  Patient presents with  . Cellulitis    HPI Julie Sanford is a 36 y.o. female with history of bipolar disorder, substance abuse, seizures presents to the ED for gradually worsening right lower leg swelling, redness, warmth and pain 2 days associated with night sweats and chills. No fever. Patient was discharged from hospital one week ago for septic arthritis and E. Coli diarrhea. She received IV antibiotics and was discharged with PO Bactrim which she has been compliant with at home. Patient states that pain worsened a couple of days after discharge, swelling, redness and warmth started 2 days ago.  Denies recent IV drug use, states last time was 6 weeks ago. No trauma to right lower extremity. No history of DVT/PE, states that she's been ambulating with her rolling walker at home and while she was admitted in the hospital. Denies focal numbness or weakness to distal extremity.  HPI  Past Medical History:  Diagnosis Date  . Bipolar 1 disorder (HCC)   . Depression   . Herpes   . Seizures [facility])     Patient Active Problem List   Diagnosis Date Noted  . Septic joint (HCC) 09/04/2016  . Septic arthritis of knee, right (HCC) 08/24/2016  . Abnormal CBC 04/29/2011  . PTSD (post-traumatic stress disorder) 04/25/2011    Class: Chronic  . 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 Anemia 04/23/2011  . Cocaine abuse 04/23/2011    Past Surgical History:  Procedure Laterality Date  . CESAREAN SECTION      OB History    No data available       Home Medications    Prior to Admission medications   Medication Sig Start Date End Date Taking? Authorizing Provider  baclofen (LIORESAL) 10 MG tablet Take 10 mg by mouth 3 (three) times daily. 07/31/16  Yes [provider]  clonazePAM (KLONOPIN) 0.5 MG tablet Take 0.25 mg by mouth every 8 (eight)  hours. 08/17/16  Yes [provider]  gabapentin (NEURONTIN) 300 MG capsule Take 600 mg by mouth 4 (four) times daily - after meals and at bedtime. 08/15/16  Yes [provider]  hydrOXYzine (VISTARIL) 50 MG capsule Take 50-100 mg by mouth 3 (three) times daily as needed for anxiety or itching.  07/13/16  Yes [provider]  ipratropium-albuterol (DUONEB) 0.5-2.5 (3) MG/3ML SOLN Take 3 mLs by nebulization every 6 (six) hours as needed (sob, wheezing).  08/08/16  Yes [provider]  LATUDA 80 MG TABS tablet Take 80 mg by mouth at bedtime. 07/13/16  Yes [provider]  levETIRAcetam (KEPPRA) 500 MG tablet Take 1,000 mg by mouth 2 (two) times daily. 08/16/16  Yes [provider]  methocarbamol (ROBAXIN) 750 MG tablet Take 1,500 mg by mouth 4 (four) times daily as needed for muscle spasms.  08/10/16  Yes [provider]  PROAIR HFA 108 (90 Base) MCG/ACT inhaler Inhale 2 puffs into the lungs every 6 (six) hours as needed for wheezing or shortness of breath.  08/19/16  Yes [provider]  sertraline (ZOLOFT) 100 MG tablet Take 100 mg by mouth daily. 07/31/16  Yes [provider]  SUBOXONE 8-2 MG FILM Place 1 Film under the tongue 3 (three) times daily. 08/21/16  Yes [provider]  sulfamethoxazole-trimethoprim (BACTRIM DS,SEPTRA DS) 800-160 MG tablet Take 1 tablet by mouth 2 (two)  times daily. 08/28/16 09/11/16 Yes Sheikh, Omair Latif, DO  traZODone (DESYREL) 100 MG tablet Take 400 mg by mouth at bedtime. 08/13/16  Yes [provider]    Family History Family History  Problem Relation Age of Onset  . Diabetes Sister     Social History Social History  Substance Use Topics  . Smoking status: Current Every Day Smoker    Packs/day: 2.00    Types: Cigarettes  . Smokeless tobacco: Never Used  . Alcohol use No     Allergies   Quetiapine; Tramadol; and Penicillins   Review of Systems Review of Systems   Constitutional: Positive for chills and diaphoresis. Negative for fever.  HENT: Negative for congestion and sore throat.   Respiratory: Negative for cough and shortness of breath.   Cardiovascular: Positive for leg swelling. Negative for chest pain and palpitations.  Gastrointestinal: Negative for abdominal pain, diarrhea, nausea and vomiting.  Genitourinary: Negative for difficulty urinating, dysuria and hematuria.  Musculoskeletal: Positive for arthralgias, gait problem, joint swelling and myalgias.  Skin: Positive for color change. Negative for pallor and rash.  Neurological: Negative for headaches.     Physical Exam Updated Vital Signs BP 90/68 (BP Location: Right Arm)   Pulse 70   Temp (!) 96.9 F (36.1 C) (Rectal)   Resp 16   LMP 08/28/2016 (Approximate)   SpO2 93%   Physical Exam  Constitutional: She is oriented to person, place, and time. She appears well-developed and well-nourished. No distress.  HENT:  Head: Normocephalic and atraumatic.  Nose: Nose normal.  Mouth/Throat: Oropharynx is clear and moist. No oropharyngeal exudate.  Eyes: Pupils are equal, round, and reactive to light. Conjunctivae and EOM are normal.  Neck: Normal range of motion. Neck supple.  Cardiovascular: Normal rate, regular rhythm, normal heart sounds and intact distal pulses.   No murmur heard. DP, PT and popliteal pulses 2+ bilaterally   Pulmonary/Chest: Effort normal and breath sounds normal. No respiratory distress. She has no wheezes. She has no rales. She exhibits no tenderness.  Abdominal: Soft. Bowel sounds are normal. There is no tenderness.  No suprapubic or CVA tenderness   Musculoskeletal: Normal range of motion. She exhibits edema, tenderness and deformity.  Full but painful PROM of right knee and ankle Right calf and popliteal space tenderness Calf pain with ankle dorsiflexion    Lymphadenopathy:    She has no cervical adenopathy.  Neurological: She is alert and oriented to  person, place, and time.  Skin: Skin is warm and dry. Capillary refill takes less than 2 seconds. There is erythema.  RLE is erythematous, edematous and tender Scan to anterior right knee with tenderness and circumferential erythema, no active drainage or fluctuance  No new skin abrasions or cuts to RLE  Psychiatric: She has a normal mood and affect. Her behavior is normal. Judgment and thought content normal.  Nursing note and vitals reviewed.      ED Treatments / Results  Labs (all labs ordered are listed, but only abnormal results are displayed) Labs Reviewed  CBC WITH DIFFERENTIAL/PLATELET - Abnormal; Notable for the following:       Result Value   Hemoglobin 9.5 (*)    HCT 29.9 (*)    MCV 66.6 (*)    MCH 21.2 (*)    RDW 19.8 (*)    All other components within normal limits  COMPREHENSIVE METABOLIC PANEL - Abnormal; Notable for the following:    Chloride 99 (*)    Creatinine, Ser 1.06 (*)  Albumin 3.4 (*)    ALT 12 (*)    All other components within normal limits  I-STAT CHEM 8, ED - Abnormal; Notable for the following:    Chloride 98 (*)    Creatinine, Ser 1.10 (*)    Hemoglobin 10.2 (*)    HCT 30.0 (*)    All other components within normal limits  CULTURE, BLOOD (ROUTINE X 2)  CULTURE, BLOOD (ROUTINE X 2)  URINE CULTURE  URINALYSIS, ROUTINE W REFLEX MICROSCOPIC  RAPID URINE DRUG SCREEN, HOSP PERFORMED  I-STAT CG4 LACTIC ACID, ED  POC URINE PREG, ED    EKG  EKG Interpretation None       Radiology Dg Chest 2 View  Result Date: 09/04/2016 CLINICAL DATA:  Mild hypoxia. EXAM: CHEST  2 VIEW COMPARISON:  04/08/2016 FINDINGS: The lungs are clear. The pulmonary vasculature is normal. Heart size is normal. Hilar and mediastinal contours are unremarkable. There is no pleural effusion. IMPRESSION: No active cardiopulmonary disease. Electronically Signed   By: Ellery Plunk M.D.   On: 09/04/2016 07:03   Dg Knee Complete 4 Views Right  Result Date:  09/04/2016 CLINICAL DATA:  Continued right knee swelling and pain. EXAM: RIGHT KNEE - COMPLETE 4+ VIEW COMPARISON:  08/24/2016. FINDINGS: Persistent soft tissue swelling over the anterior knee. Tiny knee joint effusion cannot be excluded. No associated bony abnormality identified. No evidence fracture dislocation . IMPRESSION: Persistent soft tissue swelling over the anterior knee. Tiny knee joint effusion cannot be excluded. No acute or focal bony abnormality identified. MRI of the right knee may prove useful for further evaluation . Electronically Signed   By: Maisie Fus  Register   On: 09/04/2016 06:13    Procedures Procedures (including critical care time)  Medications Ordered in ED Medications  vancomycin (VANCOCIN) IVPB 1000 mg/200 mL premix (not administered)  vancomycin (VANCOCIN) IVPB 750 mg/150 ml premix (not administered)  piperacillin-tazobactam (ZOSYN) IVPB 3.375 g (not administered)  sodium chloride 0.9 % bolus 1,000 mL (1,000 mLs Intravenous New Bag/Given 09/04/16 0710)  sodium chloride 0.9 % bolus 1,000 mL (1,000 mLs Intravenous New Bag/Given 09/04/16 0710)  piperacillin-tazobactam (ZOSYN) IVPB 3.375 g (3.375 g Intravenous New Bag/Given 09/04/16 0708)     Initial Impression / Assessment and Plan / ED Course  I have reviewed the triage vital signs and the nursing notes.  Pertinent labs & imaging results that were available during my care of the patient were reviewed by me and considered in my medical decision making (see chart for details).    History and exam concerning for septic arthritis versus cellulitis versus DVT. Extremity is neurovascularly intact. Patient is afebrile, blood pressure have been soft. She is somnolent and falls asleep during conversation, desaturating to 90-93 while falling asleep. Questioning possible illicit drug use PTA?  Lab work without leukocytosis. Lactic acid is normal. Stable anemia. Right knee x-ray shows small joint effusion, recommending MRI. No  evidence of pneumonia on chest x-ray. Pending urinalysis although patient has no urinary symptoms to suggest UTI.  Final Clinical Impressions(s) / ED Diagnoses   Final diagnoses:  Cellulitis of right leg   Spoke to hospitalist who will admit patient for IV antibiotics. I have placed a consult to orthopedics, pending recommendations. Patient was given IV antibiotics, 2 L IV fluids in the emergency department. Blood cultures, U/A, Ucx, UDS, LE venous ultrasound pending.   Spoke to ortho who will evaluate patient. LE venous showed non occlussive U/S.   New Prescriptions New Prescriptions   No medications on file  Liberty HandyGibbons, Claudia J, PA-C 09/04/16 [id number]    Eber HongMiller, Brian, MD 09/16/16 [phone]

## 2016-09-04 NOTE — ED Triage Notes (Signed)
Pt was admitted on the 5th floor last week for 4 days, she was sent home with antibiotics, she states that it's getting worse and now her lower leg is swollen and red

## 2016-09-04 NOTE — Progress Notes (Signed)
Pharmacy Antibiotic Note  Julie FriesChristina M Sanford is a 36 y.o. female admitted on 09/04/2016 with cellulitis.  Pharmacy has been consulted for vancomycin and zosyn dosing.  She was on vancomycin on recent admission and vancomycin trough 23 on vanc 500 mg IV q8h for r/o osteo and dose changed to 750 q12.    Plan: vancomycin 1 gm x 1 then Vancomycin 750 mg IV every 12 hours.  Goal trough 10-15 mcg/mL. Zosyn 3.375g IV q8h (4 hour infusion).  F/u renal fxn, wbc, temp Vancomycin levels as needed    Temp (24hrs), Avg:98.3 F (36.8 C), Min:98.3 F (36.8 C), Max:98.3 F (36.8 C)  No results for input(s): WBC, CREATININE, LATICACIDVEN, VANCOTROUGH, VANCOPEAK, VANCORANDOM, GENTTROUGH, GENTPEAK, GENTRANDOM, TOBRATROUGH, TOBRAPEAK, TOBRARND, AMIKACINPEAK, AMIKACINTROU, AMIKACIN in the last 168 hours.  Estimated Creatinine Clearance: 58.2 mL/min (by C-G formula based on SCr of 0.88 mg/dL).    Allergies  Allergen Reactions  . Quetiapine Other (See Comments)    Severe muscle spasms and hallucinations  . Tramadol Other (See Comments)    Muscle spasms, seizure and hallucinations  . Penicillins      Thank you for allowing pharmacy to be a part of this patient's care.  Herby AbrahamMichelle T. Nizar Sanford, Pharm.D. [phone] 09/04/2016 6:48 AM

## 2016-09-04 NOTE — Progress Notes (Signed)
ANTICOAGULATION CONSULT NOTE - Initial Consult  Pharmacy Consult for Lovenox Indication: DVT  Allergies  Allergen Reactions  . Quetiapine Other (See Comments)    Severe muscle spasms and hallucinations  . Tramadol Other (See Comments)    Muscle spasms, seizure and hallucinations  . Penicillins     Patient Measurements:     Vital Signs: Temp: 97.5 F (36.4 C) (07/25 1221) Temp Source: Oral (07/25 1221) BP: 106/76 (07/25 1221) Pulse Rate: 64 (07/25 1221)  Labs:  Recent Labs  09/04/16 0635 09/04/16 0648  HGB 9.5* 10.2*  HCT 29.9* 30.0*  PLT 332  --   CREATININE 1.06* 1.10*    Estimated Creatinine Clearance: 46.5 mL/min (A) (by C-G formula based on SCr of 1.1 mg/dL (H)).   Medical History: Past Medical History:  Diagnosis Date  . Bipolar 1 disorder (HCC)   . Depression   . Herpes   . Seizures (HCC)      Assessment: [age] yoF recently admitted for right knee patellar bursitis readmitted for leg swelling.  Preliminary report of right lower extremity venous duplex shows age-indeterminate, non-occlusive deep vein thrombosis involving the gastrocnemius veins of the right lower extremity.  Lovenox per pharmacy ordered.  Hgb low but improved since recent admission. Platelets WNL. Low weight. SCr slightly elevated from baseline but CrCl>30 ml/min  Goal of Therapy:  Anti-Xa level 0.6-1 units/ml 4hrs after LMWH dose given Monitor platelets by anticoagulation protocol: Yes   Plan:  Lovenox 1 mg/kg (40 mg) SQ q12h. Check CBC q72h.  Clance Bollunyon, Ece Cumberland 09/04/2016,12:25 PM

## 2016-09-05 DIAGNOSIS — N179 Acute kidney failure, unspecified: Secondary | ICD-10-CM

## 2016-09-05 DIAGNOSIS — Z880 Allergy status to penicillin: Secondary | ICD-10-CM

## 2016-09-05 DIAGNOSIS — E43 Unspecified severe protein-calorie malnutrition: Secondary | ICD-10-CM | POA: Insufficient documentation

## 2016-09-05 DIAGNOSIS — F1410 Cocaine abuse, uncomplicated: Secondary | ICD-10-CM

## 2016-09-05 DIAGNOSIS — F1120 Opioid dependence, uncomplicated: Secondary | ICD-10-CM

## 2016-09-05 DIAGNOSIS — L03115 Cellulitis of right lower limb: Secondary | ICD-10-CM

## 2016-09-05 DIAGNOSIS — Z881 Allergy status to other antibiotic agents status: Secondary | ICD-10-CM

## 2016-09-05 LAB — COMPREHENSIVE METABOLIC PANEL
ALK PHOS: 55 U/L (ref 38–126)
ALT: 11 U/L — ABNORMAL LOW (ref 14–54)
ANION GAP: 6 (ref 5–15)
AST: 15 U/L (ref 15–41)
Albumin: 2.9 g/dL — ABNORMAL LOW (ref 3.5–5.0)
BILIRUBIN TOTAL: 0.2 mg/dL — AB (ref 0.3–1.2)
BUN: 24 mg/dL — AB (ref 6–20)
CALCIUM: 8.5 mg/dL — AB (ref 8.9–10.3)
CO2: 25 mmol/L (ref 22–32)
Chloride: 105 mmol/L (ref 101–111)
Creatinine, Ser: 1.34 mg/dL — ABNORMAL HIGH (ref 0.44–1.00)
GFR calc Af Amer: 59 mL/min — ABNORMAL LOW (ref >60)
GFR, EST NON AFRICAN AMERICAN: 51 mL/min — AB (ref >60)
Glucose, Bld: 87 mg/dL (ref 65–99)
POTASSIUM: 4.3 mmol/L (ref 3.5–5.1)
Sodium: 136 mmol/L (ref 135–145)
TOTAL PROTEIN: 6.5 g/dL (ref 6.5–8.1)

## 2016-09-05 LAB — CBC
HEMATOCRIT: 27.8 % — AB (ref 36.0–46.0)
Hemoglobin: 8.5 g/dL — ABNORMAL LOW (ref 12.0–15.0)
MCH: 20.9 pg — ABNORMAL LOW (ref 26.0–34.0)
MCHC: 30.6 g/dL (ref 30.0–36.0)
MCV: 68.3 fL — ABNORMAL LOW (ref 78.0–100.0)
Platelets: 279 10*3/uL (ref 150–400)
RBC: 4.07 MIL/uL (ref 3.87–5.11)
RDW: 20 % — AB (ref 11.5–15.5)
WBC: 3.8 10*3/uL — AB (ref 4.0–10.5)

## 2016-09-05 MED ORDER — ADULT MULTIVITAMIN W/MINERALS CH
1.0000 | ORAL_TABLET | Freq: Every day | ORAL | Status: DC
  Administered 2016-09-05 – 2016-09-06 (×2): 1 via ORAL
  Filled 2016-09-05 (×2): qty 1

## 2016-09-05 MED ORDER — GABAPENTIN 300 MG PO CAPS
300.0000 mg | ORAL_CAPSULE | Freq: Three times a day (TID) | ORAL | Status: DC
  Administered 2016-09-05 – 2016-09-06 (×4): 300 mg via ORAL
  Filled 2016-09-05 (×4): qty 1

## 2016-09-05 MED ORDER — DEXTROSE 5 % IV SOLN
2.0000 g | Freq: Two times a day (BID) | INTRAVENOUS | Status: DC
  Filled 2016-09-05: qty 2

## 2016-09-05 MED ORDER — TRAZODONE HCL 100 MG PO TABS
100.0000 mg | ORAL_TABLET | Freq: Every evening | ORAL | Status: DC | PRN
  Administered 2016-09-05: 100 mg via ORAL
  Filled 2016-09-05: qty 1

## 2016-09-05 MED ORDER — VANCOMYCIN HCL IN DEXTROSE 750-5 MG/150ML-% IV SOLN
750.0000 mg | INTRAVENOUS | Status: DC
  Administered 2016-09-06: 750 mg via INTRAVENOUS
  Filled 2016-09-05: qty 150

## 2016-09-05 MED ORDER — RIVAROXABAN 15 MG PO TABS
15.0000 mg | ORAL_TABLET | Freq: Two times a day (BID) | ORAL | Status: DC
  Administered 2016-09-05 – 2016-09-06 (×2): 15 mg via ORAL
  Filled 2016-09-05 (×2): qty 1

## 2016-09-05 MED ORDER — BOOST PLUS PO LIQD
237.0000 mL | Freq: Three times a day (TID) | ORAL | Status: DC
  Filled 2016-09-05 (×4): qty 237

## 2016-09-05 MED ORDER — RIVAROXABAN 15 MG PO TABS: 15.0000 mg | ORAL_TABLET | Freq: Two times a day (BID) | ORAL | Status: DC

## 2016-09-05 MED ORDER — DEXTROSE 5 % IV SOLN
2.0000 g | INTRAVENOUS | Status: DC
  Administered 2016-09-05: 2 g via INTRAVENOUS
  Filled 2016-09-05 (×2): qty 2

## 2016-09-05 MED ORDER — OXYCODONE HCL 5 MG PO TABS
5.0000 mg | ORAL_TABLET | Freq: Four times a day (QID) | ORAL | Status: DC | PRN
  Administered 2016-09-05: 5 mg via ORAL
  Administered 2016-09-05 – 2016-09-06 (×4): 10 mg via ORAL
  Filled 2016-09-05 (×6): qty 2

## 2016-09-05 NOTE — Consult Note (Addendum)
[facility]  Date of Admission:  09/04/2016  Date of Consult:  09/05/2016  Reason for Consult: Arthritis, IVDA Referring Physician: Cruzita Lederer  Impression/Recommendation Arthritis  AKI Cr has increased from 1.06 to 1.34.   DVT  acute hep panel Change anbx to vanco/cefepime Not clear anaerobes are involved in this process.  Tdap- states she had Tet vax 2 years ago at [facility].   When d/c could consider home with doxy.   Her chart notes PEN allergy- she can't remember this.   She has tolerated zosyn.   Thank you so much for this interesting consult,   Bobby Rumpf (pager) [phone] [email or web address]  Christel Bai Hausler is an 36 y.o. female.  HPI: 36 yo F with hx of bipolar, was seen 7-14 in ED with R knee swelling. She had fallen on knee 5 days PTA. She developed worsening swelling and pain and came to ED. Plain films showed soft tissue swelling, she was treated with vancomycin.  Sh had arthrocentesis showing 410 WBC (85% N), no crystals).  She was d/c home on 7-18 on bactrim.  She returns on 7-25 with worsening pain and erythema extending to her foot. She was hypotensive, drowsy and urine tox was + for opioids.  She was started on vanco/zosyn. Seen by ortho and not felt to have a septic knee. She had MRI of her knee showing: 1. Mild diffuse soft tissue swelling about the knee, nonspecific, but can be seen in the setting of cellulitis. 2. Small joint effusion, also nonspecific. If there is clinical concern for septic arthritis, recommend arthrocentesis for further evaluation. 3. Diffusely increased marrow signal on all fat suppressed images, which can be seen in the setting of gelatinous marrow transformation. 4. Mild proximal patellar tendinosis. She was found to have a DVT in her RLE/gastrocnemius.  HIV (-) 7-14.   Past Medical History:  Diagnosis Date  . Bipolar 1 disorder ([place])   . Depression   . Herpes   .  Seizures ([place])     Past Surgical History:  Procedure Laterality Date  . CESAREAN SECTION       Allergies  Allergen Reactions  . Quetiapine Other (See Comments)    Severe muscle spasms and hallucinations  . Tramadol Other (See Comments)    Muscle spasms, seizure and hallucinations  . Penicillins     Medications:  Scheduled: . buprenorphine-naloxone  1 tablet Sublingual TID  . docusate sodium  100 mg Oral BID  . enoxaparin (LOVENOX) injection  1 mg/kg Subcutaneous NOW  . enoxaparin (LOVENOX) injection  1 mg/kg Subcutaneous Q12H  . gabapentin  300 mg Oral TID WC & HS  . levETIRAcetam  1,000 mg Oral BID  . sodium chloride flush  3 mL Intravenous Q12H    Abtx:  Anti-infectives    Start     Dose/Rate Route Frequency Ordered Stop   09/04/16 2000  vancomycin (VANCOCIN) IVPB 750 mg/150 ml premix     750 mg 150 mL/hr over 60 Minutes Intravenous Every 12 hours 09/04/16 0650     09/04/16 1400  piperacillin-tazobactam (ZOSYN) IVPB 3.375 g     3.375 g 12.5 mL/hr over 240 Minutes Intravenous Every 8 hours 09/04/16 0650     09/04/16 0700  vancomycin (VANCOCIN) IVPB 1000 mg/200 mL premix     1,000 mg 200 mL/hr over 60 Minutes Intravenous  Once 09/04/16 0646 09/04/16 0933   09/04/16 0700  piperacillin-tazobactam (ZOSYN) IVPB 3.375 g     3.375 g  100 mL/hr over 30 Minutes Intravenous  Once 09/04/16 0646 09/04/16 0906      Total days of antibiotics: 1 vanco/zosyn         Social History:  reports that she has been smoking Cigarettes.  She has been smoking about 2.00 packs per day. She has never used smokeless tobacco. She reports that she does not drink alcohol or use drugs.  Family History  Problem Relation Age of Onset  . Diabetes Sister     History obtained from chart review and the patient General ROS: no SOB, no cough, no dysuria, no constipation, no f/c.  Please see HPI. 12 point ROS o/w (-)  Blood pressure 105/74, pulse 68, temperature 97.6 F (36.4 C), temperature source  Oral, resp. rate 11, height '5\' 1"'  (1.549 m), weight 44.1 kg (97 lb 3.6 oz), last menstrual period 08/28/2016, SpO2 92 %. General appearance: alert, cooperative and no distress Eyes: negative findings: conjunctivae and sclerae normal and pupils equal, round, reactive to light and accomodation Throat: normal findings: oropharynx pink & moist without lesions or evidence of thrush Neck: no adenopathy and supple, symmetrical, trachea midline Lungs: clear to auscultation bilaterally Heart: regular rate and rhythm Abdomen: normal findings: bowel sounds normal and soft, non-tender Extremities: edema none and quarter sized superifical wound on R patella. no d/c. non-tender, no increase in heat. no RLE calf cordis felt.    Results for orders placed or performed during the hospital encounter of 09/04/16 (from the past 48 hour(s))  CBC with Differential/Platelet     Status: Abnormal   Collection Time: 09/04/16  6:35 AM  Result Value Ref Range   WBC 5.3 4.0 - 10.5 K/uL   RBC 4.49 3.87 - 5.11 MIL/uL   Hemoglobin 9.5 (L) 12.0 - 15.0 g/dL   HCT 29.9 (L) 36.0 - 46.0 %   MCV 66.6 (L) 78.0 - 100.0 fL   MCH 21.2 (L) 26.0 - 34.0 pg   MCHC 31.8 30.0 - 36.0 g/dL   RDW 19.8 (H) 11.5 - 15.5 %   Platelets 332 150 - 400 K/uL   Neutrophils Relative % 64 %   Lymphocytes Relative 31 %   Monocytes Relative 4 %   Eosinophils Relative 0 %   Basophils Relative 1 %   Neutro Abs 3.4 1.7 - 7.7 K/uL   Lymphs Abs 1.6 0.7 - 4.0 K/uL   Monocytes Absolute 0.2 0.1 - 1.0 K/uL   Eosinophils Absolute 0.0 0.0 - 0.7 K/uL   Basophils Absolute 0.1 0.0 - 0.1 K/uL   RBC Morphology TARGET CELLS   Comprehensive metabolic panel     Status: Abnormal   Collection Time: 09/04/16  6:35 AM  Result Value Ref Range   Sodium 136 135 - 145 mmol/L   Potassium 4.9 3.5 - 5.1 mmol/L   Chloride 99 (L) 101 - 111 mmol/L   CO2 29 22 - 32 mmol/L   Glucose, Bld 84 65 - 99 mg/dL   BUN 20 6 - 20 mg/dL   Creatinine, Ser 1.06 (H) 0.44 - 1.00 mg/dL     Calcium 9.3 8.9 - 10.3 mg/dL   Total Protein 7.3 6.5 - 8.1 g/dL   Albumin 3.4 (L) 3.5 - 5.0 g/dL   AST 17 15 - 41 U/L   ALT 12 (L) 14 - 54 U/L   Alkaline Phosphatase 62 38 - 126 U/L   Total Bilirubin 0.3 0.3 - 1.2 mg/dL   GFR calc non Af Amer >60 >60 mL/min   GFR calc  Af Amer >60 >60 mL/min    Comment: (NOTE) The eGFR has been calculated using the CKD EPI equation. This calculation has not been validated in all clinical situations. eGFR's persistently <60 mL/min signify possible Chronic Kidney Disease.    Anion gap 8 5 - 15  I-Stat Chem 8, ED     Status: Abnormal   Collection Time: 09/04/16  6:48 AM  Result Value Ref Range   Sodium 135 135 - 145 mmol/L   Potassium 4.9 3.5 - 5.1 mmol/L   Chloride 98 (L) 101 - 111 mmol/L   BUN 19 6 - 20 mg/dL   Creatinine, Ser 1.10 (H) 0.44 - 1.00 mg/dL   Glucose, Bld 81 65 - 99 mg/dL   Calcium, Ion 1.20 1.15 - 1.40 mmol/L   TCO2 27 0 - 100 mmol/L   Hemoglobin 10.2 (L) 12.0 - 15.0 g/dL   HCT 30.0 (L) 36.0 - 46.0 %  I-Stat CG4 Lactic Acid, ED  (not at  [facility])     Status: None   Collection Time: 09/04/16  6:49 AM  Result Value Ref Range   Lactic Acid, Venous 0.68 0.5 - 1.9 mmol/L  Urinalysis, Routine w reflex microscopic     Status: Abnormal   Collection Time: 09/04/16 10:41 AM  Result Value Ref Range   Color, Urine COLORLESS (A) YELLOW   APPearance CLEAR CLEAR   Specific Gravity, Urine 1.005 1.005 - 1.030   pH 8.0 5.0 - 8.0   Glucose, UA NEGATIVE NEGATIVE mg/dL   Hgb urine dipstick NEGATIVE NEGATIVE   Bilirubin Urine NEGATIVE NEGATIVE   Ketones, ur NEGATIVE NEGATIVE mg/dL   Protein, ur NEGATIVE NEGATIVE mg/dL   Nitrite NEGATIVE NEGATIVE   Leukocytes, UA TRACE (A) NEGATIVE   RBC / HPF 0-5 0 - 5 RBC/hpf   WBC, UA 0-5 0 - 5 WBC/hpf   Bacteria, UA NONE SEEN NONE SEEN   Squamous Epithelial / LPF 0-5 (A) NONE SEEN  Rapid urine drug screen (hospital performed)     Status: Abnormal   Collection Time: 09/04/16 10:41 AM  Result Value Ref  Range   Opiates POSITIVE (A) NONE DETECTED   Cocaine NONE DETECTED NONE DETECTED   Benzodiazepines NONE DETECTED NONE DETECTED   Amphetamines NONE DETECTED NONE DETECTED   Tetrahydrocannabinol NONE DETECTED NONE DETECTED   Barbiturates NONE DETECTED NONE DETECTED    Comment:        DRUG SCREEN FOR MEDICAL PURPOSES ONLY.  IF CONFIRMATION IS NEEDED FOR ANY PURPOSE, NOTIFY LAB WITHIN 5 DAYS.        LOWEST DETECTABLE LIMITS FOR URINE DRUG SCREEN Drug Class       Cutoff (ng/mL) Amphetamine      1000 Barbiturate      200 Benzodiazepine   993 Tricyclics       716 Opiates          300 Cocaine          300 THC              50   POC Urine Pregnancy, ED (do NOT order at [facility])     Status: None   Collection Time: 09/04/16 10:47 AM  Result Value Ref Range   Preg Test, Ur NEGATIVE NEGATIVE    Comment:        THE SENSITIVITY OF THIS METHODOLOGY IS >24 mIU/mL   I-Stat CG4 Lactic Acid, ED  (not at  [facility])     Status: Abnormal   Collection Time: 09/04/16 10:54 AM  Result Value Ref Range   Lactic Acid, Venous <0.30 (L) 0.5 - 1.9 mmol/L  Comprehensive metabolic panel     Status: Abnormal   Collection Time: 09/05/16  8:31 AM  Result Value Ref Range   Sodium 136 135 - 145 mmol/L   Potassium 4.3 3.5 - 5.1 mmol/L   Chloride 105 101 - 111 mmol/L   CO2 25 22 - 32 mmol/L   Glucose, Bld 87 65 - 99 mg/dL   BUN 24 (H) 6 - 20 mg/dL   Creatinine, Ser 1.34 (H) 0.44 - 1.00 mg/dL   Calcium 8.5 (L) 8.9 - 10.3 mg/dL   Total Protein 6.5 6.5 - 8.1 g/dL   Albumin 2.9 (L) 3.5 - 5.0 g/dL   AST 15 15 - 41 U/L   ALT 11 (L) 14 - 54 U/L   Alkaline Phosphatase 55 38 - 126 U/L   Total Bilirubin 0.2 (L) 0.3 - 1.2 mg/dL   GFR calc non Af Amer 51 (L) >60 mL/min   GFR calc Af Amer 59 (L) >60 mL/min    Comment: (NOTE) The eGFR has been calculated using the CKD EPI equation. This calculation has not been validated in all clinical situations. eGFR's persistently <60 mL/min signify possible Chronic Kidney Disease.     Anion gap 6 5 - 15  CBC     Status: Abnormal   Collection Time: 09/05/16  8:31 AM  Result Value Ref Range   WBC 3.8 (L) 4.0 - 10.5 K/uL   RBC 4.07 3.87 - 5.11 MIL/uL   Hemoglobin 8.5 (L) 12.0 - 15.0 g/dL   HCT 27.8 (L) 36.0 - 46.0 %   MCV 68.3 (L) 78.0 - 100.0 fL   MCH 20.9 (L) 26.0 - 34.0 pg   MCHC 30.6 30.0 - 36.0 g/dL   RDW 20.0 (H) 11.5 - 15.5 %   Platelets 279 150 - 400 K/uL      Component Value Date/Time   SDES BLOOD RIGHT ANTECUBITAL 08/24/2016 1129   SDES BLOOD LEFT ANTECUBITAL 08/24/2016 1129   SPECREQUEST IN PEDIATRIC BOTTLE Blood Culture adequate volume 08/24/2016 1129   SPECREQUEST IN PEDIATRIC BOTTLE Blood Culture adequate volume 08/24/2016 1129   CULT  08/24/2016 1129    NO GROWTH 5 DAYS Performed at [facility], [place] [address], [place], [place] [address]    CULT  08/24/2016 1129    NO GROWTH 5 DAYS Performed at [facility], [place] [address], [place], [place] [address]    REPTSTATUS 08/29/2016 FINAL 08/24/2016 1129   REPTSTATUS 08/29/2016 FINAL 08/24/2016 1129   Dg Chest 2 View  Result Date: 09/04/2016 CLINICAL DATA:  Mild hypoxia. EXAM: CHEST  2 VIEW COMPARISON:  04/08/2016 FINDINGS: The lungs are clear. The pulmonary vasculature is normal. Heart size is normal. Hilar and mediastinal contours are unremarkable. There is no pleural effusion. IMPRESSION: No active cardiopulmonary disease. Electronically Signed   By: Andreas Newport M.D.   On: 09/04/2016 07:03   Mr Knee Right W Wo Contrast  Result Date: 09/04/2016 CLINICAL DATA:  Recent admission for right knee prepatellar bursitis. Arthrocentesis was negative for septic arthritis at that time. Currently on antibiotics. Presenting with worsening right knee pain and edema. EXAM: MRI OF THE RIGHT KNEE WITHOUT CONTRAST TECHNIQUE: Multiplanar, multisequence MR imaging of the right knee was performed without intravenous contrast. COMPARISON:  Right knee x-rays dated September 04, 2016. FINDINGS: MENISCI Medial  meniscus:  Intact with normal morphology. Lateral meniscus:  Intact with normal morphology. LIGAMENTS Cruciates:  Intact. Collaterals:  Intact. CARTILAGE Patellofemoral:  Preserved. Medial:  Preserved. Lateral:  Preserved. MISCELLANEOUS Joint:  Small joint effusion. Popliteal Fossa:  Unremarkable. No significant Baker's cyst. Extensor Mechanism: Intact. Mild increased intermediate signal within the proximal patellar tendon. Bones: Faintly decreased T1 marrow signal with diffusely increased marrow signal on all fat suppressed images. Other:  Mild diffuse soft tissue swelling about the knee. IMPRESSION: 1. Mild diffuse soft tissue swelling about the knee, nonspecific, but can be seen in the setting of cellulitis. 2. Small joint effusion, also nonspecific. If there is clinical concern for septic arthritis, recommend arthrocentesis for further evaluation. 3. Diffusely increased marrow signal on all fat suppressed images, which can be seen in the setting of gelatinous marrow transformation. 4. Mild proximal patellar tendinosis. Electronically Signed   By: Titus Dubin M.D.   On: 09/04/2016 12:33   Dg Knee Complete 4 Views Right  Result Date: 09/04/2016 CLINICAL DATA:  Continued right knee swelling and pain. EXAM: RIGHT KNEE - COMPLETE 4+ VIEW COMPARISON:  08/24/2016. FINDINGS: Persistent soft tissue swelling over the anterior knee. Tiny knee joint effusion cannot be excluded. No associated bony abnormality identified. No evidence fracture dislocation . IMPRESSION: Persistent soft tissue swelling over the anterior knee. Tiny knee joint effusion cannot be excluded. No acute or focal bony abnormality identified. MRI of the right knee may prove useful for further evaluation . Electronically Signed   By: Marcello Moores  Register   On: 09/04/2016 06:13   No results found for this or any previous visit (from the past 240 hour(s)).    09/05/2016, 10:59 AM     LOS: 1 day    Records and images were personally reviewed  where available.  Bobby Rumpf, MD [facility] [phone] 09/05/2016, 10:59 AM

## 2016-09-05 NOTE — Discharge Instructions (Addendum)
Follow with PCP in 1 week  Please get a complete blood count and chemistry panel checked by your Primary MD at your next visit, and again as instructed by your Primary MD. Please get your medications reviewed and adjusted by your Primary MD.  Please request your Primary MD to go over all Hospital Tests and Procedure/Radiological results at the follow up, please get all Hospital records sent to your Prim MD by signing hospital release before you go home.  If you had Pneumonia of Lung problems at the Hospital: Please get a 2 view Chest X ray done in 6-8 weeks after hospital discharge or sooner if instructed by your Primary MD.  If you have Congestive Heart Failure: Please call your Cardiologist or Primary MD anytime you have any of the following symptoms:  1) 3 pound weight gain in 24 hours or 5 pounds in 1 week  2) shortness of breath, with or without a dry hacking cough  3) swelling in the hands, feet or stomach  4) if you have to sleep on extra pillows at night in order to breathe  Follow cardiac low salt diet and 1.5 lit/day fluid restriction.  If you have diabetes Accuchecks 4 times/day, Once in AM empty stomach and then before each meal. Log in all results and show them to your primary doctor at your next visit. If any glucose reading is under 80 or above 300 call your primary MD immediately.  If you have Seizure/Convulsions/Epilepsy: Please do not drive, operate heavy machinery, participate in activities at heights or participate in high speed sports until you have seen by Primary MD or a Neurologist and advised to do so again.  If you had Gastrointestinal Bleeding: Please ask your Primary MD to check a complete blood count within one week of discharge or at your next visit. Your endoscopic/colonoscopic biopsies that are pending at the time of discharge, will also need to followed by your Primary MD.  Get Medicines reviewed and adjusted. Please take all your medications with you  for your next visit with your Primary MD  Please request your Primary MD to go over all hospital tests and procedure/radiological results at the follow up, please ask your Primary MD to get all Hospital records sent to his/her office.  If you experience worsening of your admission symptoms, develop shortness of breath, life threatening emergency, suicidal or homicidal thoughts you must seek medical attention immediately by calling 911 or calling your MD immediately  if symptoms less severe.  You must read complete instructions/literature along with all the possible adverse reactions/side effects for all the Medicines you take and that have been prescribed to you. Take any new Medicines after you have completely understood and accpet all the possible adverse reactions/side effects.   Do not drive or operate heavy machinery when taking Pain medications.   Do not take more than prescribed Pain, Sleep and Anxiety Medications  Special Instructions: If you have smoked or chewed Tobacco  in the last 2 yrs please stop smoking, stop any regular Alcohol  and or any Recreational drug use.  Wear Seat belts while driving.  Please note You were cared for by a hospitalist during your hospital stay. If you have any questions about your discharge medications or the care you received while you were in the hospital after you are discharged, you can call the unit and asked to speak with the hospitalist on call if the hospitalist that took care of you is not available. Once you are  discharged, your primary care physician will handle any further medical issues. Please note that NO REFILLS for any discharge medications will be authorized once you are discharged, as it is imperative that you return to your primary care physician (or establish a relationship with a primary care physician if you do not have one) for your aftercare needs so that they can reassess your need for medications and monitor your lab values.  You  can reach the hospitalist office at phone [phone] or fax [phone]   If you do not have a primary care physician, you can call [phone] for a physician referral.  Activity: As tolerated with Full fall precautions use walker/cane & assistance as needed  Diet: regular  Disposition Home    Information on my medicine - XARELTO (rivaroxaban)  This medication education was reviewed with me or my healthcare representative as part of my discharge preparation.  The pharmacist that spoke with me during my hospital stay was:  Absher, Ky Barbanandall K, RPH  WHY WAS XARELTO PRESCRIBED FOR YOU? Xarelto was prescribed to treat blood clots that may have been found in the veins of your legs (deep vein thrombosis) or in your lungs (pulmonary embolism) and to reduce the risk of them occurring again.  What do you need to know about Xarelto? The starting dose is one 15 mg tablet taken TWICE daily with food for the FIRST 21 DAYS then  the dose is changed to one 20 mg tablet taken ONCE A DAY with your evening meal.  DO NOT stop taking Xarelto without talking to the health care provider who prescribed the medication.  Refill your prescription for 20 mg tablets before you run out.  After discharge, you should have regular check-up appointments with your healthcare provider that is prescribing your Xarelto.  In the future your dose may need to be changed if your kidney function changes by a significant amount.  What do you do if you miss a dose? If you are taking Xarelto TWICE DAILY and you miss a dose, take it as soon as you remember. You may take two 15 mg tablets (total 30 mg) at the same time then resume your regularly scheduled 15 mg twice daily the next day.  If you are taking Xarelto ONCE DAILY and you miss a dose, take it as soon as you remember on the same day then continue your regularly scheduled once daily regimen the next day. Do not take two doses of Xarelto at the same time.   Important  Safety Information Xarelto is a blood thinner medicine that can cause bleeding. You should call your healthcare provider right away if you experience any of the following: ? Bleeding from an injury or your nose that does not stop. ? Unusual colored urine (red or dark brown) or unusual colored stools (red or black). ? Unusual bruising for unknown reasons. ? A serious fall or if you hit your head (even if there is no bleeding).  Some medicines may interact with Xarelto and might increase your risk of bleeding while on Xarelto. To help avoid this, consult your healthcare provider or pharmacist prior to using any new prescription or non-prescription medications, including herbals, vitamins, non-steroidal anti-inflammatory drugs (NSAIDs) and supplements.  This website has more information on Xarelto: [email or web address].

## 2016-09-05 NOTE — Progress Notes (Signed)
ANTICOAGULATION CONSULT NOTE  Pharmacy Consult for Xarelto Indication: DVT  Allergies  Allergen Reactions  . Quetiapine Other (See Comments)    Severe muscle spasms and hallucinations  . Tramadol Other (See Comments)    Muscle spasms, seizure and hallucinations  . Penicillins     Patient Measurements: Height: 5\' 1"  (154.9 cm) Weight: 97 lb 3.6 oz (44.1 kg) IBW/kg (Calculated) : 47.8   Vital Signs: Temp: 97.6 F (36.4 C) (07/26 0800) Temp Source: Oral (07/26 0800) BP: 105/74 (07/26 0800) Pulse Rate: 68 (07/26 0800)  Labs:  Recent Labs  09/04/16 0635 09/04/16 0648 09/05/16 0831  HGB 9.5* 10.2* 8.5*  HCT 29.9* 30.0* 27.8*  PLT 332  --  279  CREATININE 1.06* 1.10* 1.34*    Estimated Creatinine Clearance: 41.2 mL/min (A) (by C-G formula based on SCr of 1.34 mg/dL (H)).   Medical History: Past Medical History:  Diagnosis Date  . Bipolar 1 disorder ([facility])   . Depression   . Herpes   . Seizures ([facility])     Medications:  Scheduled:  . buprenorphine-naloxone  1 tablet Sublingual TID  . docusate sodium  100 mg Oral BID  . enoxaparin (LOVENOX) injection  1 mg/kg Subcutaneous NOW  . gabapentin  300 mg Oral TID WC & HS  . levETIRAcetam  1,000 mg Oral BID  . Rivaroxaban  15 mg Oral BID WC  . sodium chloride flush  3 mL Intravenous Q12H   Infusions:  . sodium chloride 75 mL/hr at 09/05/16 0800  . sodium chloride 100 mL/hr at 09/05/16 1041  . ceFEPime (MAXIPIME) IV    . [START ON 09/06/2016] vancomycin     PRN: bisacodyl, clonazePAM, ipratropium-albuterol, ketorolac, ondansetron **OR** ondansetron (ZOFRAN) IV, oxyCODONE  Assessment: 36 y/o F admitted with sepsis due to R knee cellulitis, ultrasound demonstrated DVT in RLE, was started on full-dose Lovenox yesterday.  Orders received today to transition patient from Lovenox to Xarelto.  Patient reportedly declined this morning's Lovenox dose. Hgb low in setting of chronic anemia Pltc WNL SCr up a bit today but  CrCl remains > 30 mL/hr Hx of seizure disorder noted but patient's current antiepileptic drug regimen does not contain any enzyme-inducing agents that would interact with Xarelto.   Plan: Xarelto 15 mg BID with food x 21 days, then 20 mg daily with food. Patient medication teaching completed, questions answered. Coupon provided that should cover cost of first 30 day prescription.  Thank you for allowing pharmacy to participate in this patient's care.  Elie Goodyandy Aeon Koors, PharmD, BCPS Pager: [phone] 09/05/2016  12:48 PM

## 2016-09-05 NOTE — Progress Notes (Signed)
Patient ID: [name], female   DOB: Jan 10, 1981, 36 y.o.   MRN: [id number] Pt seen by Dr. Shelle IronBeane this AM for recheck knee. Cellulitis near absent, no sign of septic joint, swelling improved. No need for any surgical intervention. Plan to follow up as initially arranged with Dr. Lequita HaltAluisio. Will sign off, reconsult ortho if needed.

## 2016-09-05 NOTE — Progress Notes (Addendum)
Pharmacy Antibiotic Note  Julie Sanford is a 36 y.o. female admitted on 09/04/2016 with cellulitis.  Pharmacy has been consulted for vancomycin and zosyn dosing.   She had a recent admission for prepatellar bursitis septic w/ negattive joint tap.  Readmitted for leg swelling, cellulitis.  No evidence of septic knee and swelling is significantly improved per Ortho.  ID has now changed Zosyn to Cefepime which should reduce risk of renal injury.  Today, 09/05/2016: - SCr increased to 1.34 (baseline SCr 0.75) with CrCl ~ 41 ml/min - WBC low/decreased - Afebrile  Plan:   Cefepime renally adjusted to 2g IV q24h  Decrease to Vancomycin 750 mg IV q24h.  Measure Vanc trough at steady state.  Follow up renal fxn, culture results, and clinical course.  Daily SCr    Height: 5\' 1"  (154.9 cm) Weight: 97 lb 3.6 oz (44.1 kg) IBW/kg (Calculated) : 47.8  Temp (24hrs), Avg:97.7 F (36.5 C), Min:97.4 F (36.3 C), Max:98.3 F (36.8 C)   Recent Labs Lab 09/04/16 0635 09/04/16 0648 09/04/16 0649 09/04/16 1054 09/05/16 0831  WBC 5.3  --   --   --  3.8*  CREATININE 1.06* 1.10*  --   --  1.34*  LATICACIDVEN  --   --  0.68 <0.30*  --     Estimated Creatinine Clearance: 41.2 mL/min (A) (by C-G formula based on SCr of 1.34 mg/dL (H)).    Allergies  Allergen Reactions  . Quetiapine Other (See Comments)    Severe muscle spasms and hallucinations  . Tramadol Other (See Comments)    Muscle spasms, seizure and hallucinations  . Penicillins    Antimicrobials this admission:  7/25 Vanc >>  7/25 Zosyn >> 7/26 7/26 Cefepime >>  Dose adjustments this admission:  Previous admission:  vancomycin trough 23 on vanc 500 mg IV q8h for r/o osteo and dose changed to 750 q12. 7/26 Vanc dose reduced for increasing SCr  Microbiology results:  7/25 BCx: sent 7/25 UCx: 10k colonies GNR   Thank you for allowing pharmacy to be a part of this patient's care.  Lynann Beaverhristine Tonette Koehne PharmD, BCPS Pager  [phone] 09/05/2016 10:44 AM

## 2016-09-05 NOTE — Care Management Note (Signed)
Case Management Note  Patient Details  Name: Julie Sanford MRN: [id number] Date of Birth: 10-14-1980  Subjective/Objective:      Cellulitis of the rt knee was in hospital x1 week ago with septic arthritis of same knee did not follow up with md as outpt.              Action/Plan: Date:  September 05, 2016 Chart reviewed for concurrent status and case management needs. Will continue to follow patient progress. Discharge Planning: following for needs Expected discharge date: [id number] Marcelle SmilingRhonda Davis, BSN, [place]3, [place]   [phone]  Expected Discharge Date:   (unknown)               Expected Discharge Plan:  Home/Self Care  In-House Referral:     Discharge planning Services  CM Consult  Post Acute Care Choice:    Choice offered to:     DME Arranged:    DME Agency:     HH Arranged:    HH Agency:     Status of Service:  In process, will continue to follow  If discussed at Long Length of Stay Meetings, dates discussed:    Additional Comments:  Golda AcreDavis, Rhonda Lynn, RN 09/05/2016, 8:46 AM

## 2016-09-05 NOTE — Progress Notes (Signed)
PROGRESS NOTE  MATTALYNN CRANDLE ZOX:[id number] DOB: [birth year] DOA: 09/04/2016 PCP: Default, Provider, MD   LOS: 1 day   Brief Narrative / Interim history:  36 y.o. female with medical history significant of Bipolar, seizure, Substance  abuse, recently discharge from hospital on 7-18 with diagnosis of Right knee patellar bursitis. She underwent arthrocentesis, which was not consistent with septic arthritis. She was treated with IV antibiotics initially and discharge home on Bactrim. She was evaluated on that admission by orthopedic surgery. Patient presented 7/25 complaining of worsening right knee pain edema, redness, this started 2 days after discharge. She has been taking bactrim, she have 2 left. She notice edema and redness spreading to her leg.   Assessment & Plan: Active Problems:   Heroin addiction (HCC)   Cocaine abuse   Septic joint (HCC)   Acute hepatic encephalopathy   Pressure injury of skin   Sepsis due to right knee overlying skin cellulitis  -Infectious disease consulted, appreciate input.  He does not like she has septic arthritis.  For now continue vancomycin and cefepime, per ID recommendations she could potentially go home on doxycycline -MRI of the right knee without clear-cut evidence for septic arthritis -Orthopedic surgery consulted, appreciate input -Sepsis physiology resolved, she is normotensive, transferred to MedSurg  Acute encephalopathy -Likely due to #1, mental status returned to baseline this morning, she is alert and oriented 4  Right thumb chronic MRSA infection -Continue IV vancomycin for now, switch to doxycycline per ID on discharge  Chronic anemia -Due to chronic disease, stable  Acute DVT -Diagnosed on 7/25, ultrasound, placed on Lovenox, discussed with patient today, will transition to Xarelto   DVT prophylaxis: Xarelto Code Status: Full code Family Communication: no family at bedside Disposition Plan: home when ready   Consultants:     Orthopedic surgery  ID   Procedures:   None   Antimicrobials:  Vancomycin 7/25 >>  Cefepime 7/26 >>   Zosyn 7/25 >> 7/26  Subjective: -in pain, her right knee and right calf are hurting. Crying in pain. No chest pain, no dyspnea  Objective: Vitals:   09/05/16 0500 09/05/16 0600 09/05/16 0700 09/05/16 0800  BP:    105/74  Pulse: 73 75 74 68  Resp: 11 11 13 11   Temp:    97.6 F (36.4 C)  TempSrc:    Oral  SpO2: 90% 92% 90% 92%  Weight:   44.1 kg (97 lb 3.6 oz)   Height:   5\' 1"  (1.549 m)     Intake/Output Summary (Last 24 hours) at 09/05/16 1148 Last data filed at 09/05/16 0800  Gross per 24 hour  Intake             1365 ml  Output                0 ml  Net             1365 ml   Filed Weights   09/05/16 0358 09/05/16 0700  Weight: 44.1 kg (97 lb 3.6 oz) 44.1 kg (97 lb 3.6 oz)    Examination:  Vitals:   09/05/16 0500 09/05/16 0600 09/05/16 0700 09/05/16 0800  BP:    105/74  Pulse: 73 75 74 68  Resp: 11 11 13 11   Temp:    97.6 F (36.4 C)  TempSrc:    Oral  SpO2: 90% 92% 90% 92%  Weight:   44.1 kg (97 lb 3.6 oz)   Height:   5\' 1"  (1.549 m)  Constitutional: NAD Respiratory: clear to auscultation bilaterally, no wheezing, no crackles. Normal respiratory effort. No accessory muscle use.  Cardiovascular: Regular rate and rhythm, no murmurs / rubs / gallops. No LE edema. 2+ pedal pulses. No carotid bruits.  Abdomen: no tenderness. Bowel sounds positive.  Skin: Open 1 inch round wound on top of the right knee with mild surrounding cellulitis Neurologic: CN 2-12 grossly intact. Strength 5/5 in all 4.  Psychiatric: Normal judgment and insight. Alert and oriented x 3. Anxious mood.    Data Reviewed: I have independently reviewed following labs and imaging studies   CBC:  Recent Labs Lab 09/04/16 0635 09/04/16 0648 09/05/16 0831  WBC 5.3  --  3.8*  NEUTROABS 3.4  --   --   HGB 9.5* 10.2* 8.5*  HCT 29.9* 30.0* 27.8*  MCV 66.6*  --  68.3*  PLT  332  --  279   Basic Metabolic Panel:  Recent Labs Lab 09/04/16 0635 09/04/16 0648 09/05/16 0831  NA 136 135 136  K 4.9 4.9 4.3  CL 99* 98* 105  CO2 29  --  25  GLUCOSE 84 81 87  BUN 20 19 24*  CREATININE 1.06* 1.10* 1.34*  CALCIUM 9.3  --  8.5*   GFR: Estimated Creatinine Clearance: 41.2 mL/min (A) (by C-G formula based on SCr of 1.34 mg/dL (H)). Liver Function Tests:  Recent Labs Lab 09/04/16 0635 09/05/16 0831  AST 17 15  ALT 12* 11*  ALKPHOS 62 55  BILITOT 0.3 0.2*  PROT 7.3 6.5  ALBUMIN 3.4* 2.9*   No results for input(s): LIPASE, AMYLASE in the last 168 hours. No results for input(s): AMMONIA in the last 168 hours. Coagulation Profile: No results for input(s): INR, PROTIME in the last 168 hours. Cardiac Enzymes: No results for input(s): CKTOTAL, CKMB, CKMBINDEX, TROPONINI in the last 168 hours. BNP (last 3 results) No results for input(s): PROBNP in the last 8760 hours. HbA1C: No results for input(s): HGBA1C in the last 72 hours. CBG: No results for input(s): GLUCAP in the last 168 hours. Lipid Profile: No results for input(s): CHOL, HDL, LDLCALC, TRIG, CHOLHDL, LDLDIRECT in the last 72 hours. Thyroid Function Tests: No results for input(s): TSH, T4TOTAL, FREET4, T3FREE, THYROIDAB in the last 72 hours. Anemia Panel: No results for input(s): VITAMINB12, FOLATE, FERRITIN, TIBC, IRON, RETICCTPCT in the last 72 hours. Urine analysis:    Component Value Date/Time   COLORURINE COLORLESS (A) 09/04/2016 1041   APPEARANCEUR CLEAR 09/04/2016 1041   LABSPEC 1.005 09/04/2016 1041   PHURINE 8.0 09/04/2016 1041   GLUCOSEU NEGATIVE 09/04/2016 1041   HGBUR NEGATIVE 09/04/2016 1041   BILIRUBINUR NEGATIVE 09/04/2016 1041   KETONESUR NEGATIVE 09/04/2016 1041   PROTEINUR NEGATIVE 09/04/2016 1041   UROBILINOGEN 0.2 10/24/2013 0120   NITRITE NEGATIVE 09/04/2016 1041   LEUKOCYTESUR TRACE (A) 09/04/2016 1041   Sepsis Labs: Invalid input(s): PROCALCITONIN,  LACTICIDVEN  Recent Results (from the past 240 hour(s))  Urine culture     Status: Abnormal (Preliminary result)   Collection Time: 09/04/16 10:41 AM  Result Value Ref Range Status   Specimen Description URINE, CLEAN CATCH  Final   Special Requests NONE  Final   Culture 10,000 COLONIES/mL GRAM NEGATIVE RODS (A)  Final   Report Status PENDING  Incomplete      Radiology Studies: Dg Chest 2 View  Result Date: 09/04/2016 CLINICAL DATA:  Mild hypoxia. EXAM: CHEST  2 VIEW COMPARISON:  04/08/2016 FINDINGS: The lungs are clear. The pulmonary vasculature is normal.  Heart size is normal. Hilar and mediastinal contours are unremarkable. There is no pleural effusion. IMPRESSION: No active cardiopulmonary disease. Electronically Signed   By: Ellery Plunkaniel R Mitchell M.D.   On: 09/04/2016 07:03   Mr Knee Right W Wo Contrast  Result Date: 09/04/2016 CLINICAL DATA:  Recent admission for right knee prepatellar bursitis. Arthrocentesis was negative for septic arthritis at that time. Currently on antibiotics. Presenting with worsening right knee pain and edema. EXAM: MRI OF THE RIGHT KNEE WITHOUT CONTRAST TECHNIQUE: Multiplanar, multisequence MR imaging of the right knee was performed without intravenous contrast. COMPARISON:  Right knee x-rays dated September 04, 2016. FINDINGS: MENISCI Medial meniscus:  Intact with normal morphology. Lateral meniscus:  Intact with normal morphology. LIGAMENTS Cruciates:  Intact. Collaterals:  Intact. CARTILAGE Patellofemoral:  Preserved. Medial:  Preserved. Lateral:  Preserved. MISCELLANEOUS Joint:  Small joint effusion. Popliteal Fossa:  Unremarkable. No significant Baker's cyst. Extensor Mechanism: Intact. Mild increased intermediate signal within the proximal patellar tendon. Bones: Faintly decreased T1 marrow signal with diffusely increased marrow signal on all fat suppressed images. Other:  Mild diffuse soft tissue swelling about the knee. IMPRESSION: 1. Mild diffuse soft tissue  swelling about the knee, nonspecific, but can be seen in the setting of cellulitis. 2. Small joint effusion, also nonspecific. If there is clinical concern for septic arthritis, recommend arthrocentesis for further evaluation. 3. Diffusely increased marrow signal on all fat suppressed images, which can be seen in the setting of gelatinous marrow transformation. 4. Mild proximal patellar tendinosis. Electronically Signed   By: Obie DredgeWilliam T Derry M.D.   On: 09/04/2016 12:33   Dg Knee Complete 4 Views Right  Result Date: 09/04/2016 CLINICAL DATA:  Continued right knee swelling and pain. EXAM: RIGHT KNEE - COMPLETE 4+ VIEW COMPARISON:  08/24/2016. FINDINGS: Persistent soft tissue swelling over the anterior knee. Tiny knee joint effusion cannot be excluded. No associated bony abnormality identified. No evidence fracture dislocation . IMPRESSION: Persistent soft tissue swelling over the anterior knee. Tiny knee joint effusion cannot be excluded. No acute or focal bony abnormality identified. MRI of the right knee may prove useful for further evaluation . Electronically Signed   By: Maisie Fushomas  Register   On: 09/04/2016 06:13     Scheduled Meds: . buprenorphine-naloxone  1 tablet Sublingual TID  . docusate sodium  100 mg Oral BID  . enoxaparin (LOVENOX) injection  1 mg/kg Subcutaneous NOW  . enoxaparin (LOVENOX) injection  1 mg/kg Subcutaneous Q12H  . gabapentin  300 mg Oral TID WC & HS  . levETIRAcetam  1,000 mg Oral BID  . sodium chloride flush  3 mL Intravenous Q12H   Continuous Infusions: . sodium chloride 75 mL/hr at 09/05/16 0800  . sodium chloride 100 mL/hr at 09/05/16 1041  . ceFEPime (MAXIPIME) IV    . [START ON 09/06/2016] vancomycin      Pamella Pertostin Seymour Pavlak, MD, PhD [facility] Pager [phone]  If 7PM-7AM, please contact night-coverage [email or web address] Password [email or web address] 09/05/2016, 11:48 AM

## 2016-09-05 NOTE — Progress Notes (Signed)
Initial Nutrition Assessment  DOCUMENTATION CODES:   Severe malnutrition in context of chronic illness  INTERVENTION:   Boost Plus chocolate TID- Each supplement provides 360kcal and 14g protein.    Double portions with all meals  MVI  NUTRITION DIAGNOSIS:   Malnutrition (severe) related to  (chronic substance abuse) as evidenced by severe depletion of body fat, severe depletion of muscle mass.  GOAL:   Patient will meet greater than or equal to 90% of their needs  MONITOR:   PO intake, Supplement acceptance, Labs, Weight trends  REASON FOR ASSESSMENT:   Consult Assessment of nutrition requirement/status  ASSESSMENT:    36 y.o. female with medical history significant of Bipolar, seizure, Substance  abuse, recently discharge from hospital on 7-18 with diagnosis of Right knee patellar bursitis. She underwent arthrocentesis, which was not consistent with septic arthritis. She was treated with IV antibiotics initially and discharge home on Bactrim. She was evaluated on that admission by orthopedic surgery. Patient presented 7/25 complaining of worsening right knee pain edema, redness, this started 2 days after discharge. She has been taking bactrim, she have 2 left. She notice edema and redness spreading to her leg.    Met with pt in room today. Pt reports intermittent poor appetite pta r/t knee pain and RD suspects substance abuse. Pt reports that she will often be in so much pain that she can't eat. Pt reports her usual body weight is around 110lbs. Per chart, pt weighed 99lbs in 05/2016 and appears to be weight stable. RD discussed with pt the importance of adequate protein intake for wound healing. Pt does not like Ensure but is willing to try Boost Plus. RD will order double portions with all meals per pt request. Pt is currently eating 100% of all meals.   Medications reviewed and include: suboxone, colace, cefepime, oxycodone, vancomycin   Labs reviewed: BUN 24(H), creat  1.34(H), Ca 8.5(L) adj. 9.38 wnl, alb 2.9(L) Wbc- 3.8(L), Hgb 8.5(L), Hct 27.8(L)  Nutrition-Focused physical exam completed. Findings are severe fat and muscle depletion over entire body, and mild edema RLE.   Diet Order:  Diet regular Room service appropriate? Yes; Fluid consistency: Thin  Skin:  Wound (see comment) (Stage II hand, cellulitis knee, non pressure injury hand)  Last BM:  7/24  Height:   Ht Readings from Last 1 Encounters:  09/05/16 '5\' 1"'  (1.549 m)    Weight:   Wt Readings from Last 1 Encounters:  09/05/16 97 lb 3.6 oz (44.1 kg)    Ideal Body Weight:  47.7 kg  BMI:  Body mass index is 18.37 kg/m.  Estimated Nutritional Needs:   Kcal:  1300-1600kcal/day   Protein:  66-75g/day   Fluid:  >1.3L/day   EDUCATION NEEDS:   Education needs addressed  Koleen Distance MS, RD, LDN Pager #[phone] After Hours Pager: [phone]

## 2016-09-06 DIAGNOSIS — I82409 Acute embolism and thrombosis of unspecified deep veins of unspecified lower extremity: Secondary | ICD-10-CM

## 2016-09-06 DIAGNOSIS — E43 Unspecified severe protein-calorie malnutrition: Secondary | ICD-10-CM

## 2016-09-06 LAB — URINE CULTURE: Culture: 10000 — AB

## 2016-09-06 LAB — BASIC METABOLIC PANEL
Anion gap: 5 (ref 5–15)
BUN: 21 mg/dL — AB (ref 6–20)
CHLORIDE: 110 mmol/L (ref 101–111)
CO2: 25 mmol/L (ref 22–32)
CREATININE: 1.02 mg/dL — AB (ref 0.44–1.00)
Calcium: 8.3 mg/dL — ABNORMAL LOW (ref 8.9–10.3)
GFR calc Af Amer: >60 mL/min (ref >60)
Glucose, Bld: 89 mg/dL (ref 65–99)
Potassium: 4.6 mmol/L (ref 3.5–5.1)
SODIUM: 140 mmol/L (ref 135–145)

## 2016-09-06 LAB — HEPATITIS PANEL, ACUTE
HEP A IGM: NEGATIVE
HEP B C IGM: NEGATIVE
HEP B S AG: NEGATIVE

## 2016-09-06 MED ORDER — DOXYCYCLINE HYCLATE 100 MG PO CAPS: 100.0000 mg | ORAL_CAPSULE | Freq: Two times a day (BID) | ORAL | 0 refills | Status: DC

## 2016-09-06 MED ORDER — RIVAROXABAN (XARELTO) VTE STARTER PACK (15 & 20 MG)
ORAL_TABLET | ORAL | 0 refills | Status: DC
Start: 2016-09-06 — End: 2016-09-27

## 2016-09-06 MED ORDER — OXYCODONE-ACETAMINOPHEN 10-325 MG PO TABS: 1.0000 | ORAL_TABLET | Freq: Four times a day (QID) | ORAL | 0 refills | Status: DC | PRN

## 2016-09-06 NOTE — Discharge Summary (Signed)
Physician Discharge Summary  Julie Sanford UJW:[id number] DOB: 03-30-1980 DOA: 09/04/2016  PCP: Default, Provider, MD  Admit date: 09/04/2016 Discharge date: 09/06/2016  Admitted From: Home Disposition: Home  Recommendations for Outpatient Follow-up:  1. Follow up with PCP in 1-2 weeks 2. Patient started on Xarelto for DVT 3. Discharged on doxycycline for 10 more days   Home Health: none Equipment/Devices: none  Discharge Condition: Stable CODE STATUS: Full code Diet recommendation: Regular  HPI: Per Dr. Sunnie Nielsen, Julie Sanford is a 36 y.o. female with medical history significant of Bipolar, seizure, Substance  abuse, recently discharge from hospital on 7-18 with diagnosis of Right knee patellar bursitis. She underwent arthrocentesis, which was not consistent with septic arthritis. She was treated with IV antibiotics initially and discharge home on Bactrim. She was evaluated on that admission by ortho and ID. Patient presents today complaining of worsening right knee pain edema, redness, this started 2 days after discharge. She has been taking bactrim, she have 2 left. She notice edema and redness spreading to her leg.  On my evaluation she is very sleepy, she keep eyes close, she answer some questions. She denies using drugs recently. ED Course: Patient was found to have blood pressure at 88 /57, temp 96.9, white blood cell 5.3, hemoglobin 9.5 creatinine 1.06, lactic acid 0.68. Chest x-ray; no active cardiopulmonary disease. Right knee x-ray; Persistent soft tissue swelling over the anterior knee. Tiny knee joint effusion cannot be excluded. No acute or focal bony abnormality identified. MRI of the right knee may prove useful for further evaluation .  Hospital Course: Discharge Diagnoses:  Active Problems:   Heroin addiction (HCC)   Cocaine abuse   Septic joint (HCC)   Acute hepatic encephalopathy   Pressure injury of skin   Protein-calorie malnutrition, severe  Sepsis due  to right knee overlying skin cellulitis -patient was admitted to the hospital with sepsis-like physiology in the setting of right knee skin cellulitis.  She underwent an MRI in the emergency room which was without clear evidence of septic arthritis.  Orthopedic surgery was consulted and have evaluated patient while hospitalized.  She had a recent hospitalization with concern for septic knee and underwent a arthrocentesis at that time and the fluid removed did not show any evidence of an infection.  Infectious disease was consulted during this hospitalization, and recommended vancomycin and cefepime initially, then to be transitioned to oral doxycycline for 10 additional days.  Discussed with Dr. Ninetta Lights in the day of discharge.  Her sepsis physiology improved, she was afebrile, her cellulitic area has improved significantly and her pain has improved as well. Acute encephalopathy -Likely due to #1, mental status returned to baseline Right thumb chronic MRSA infection -doxycycline per ID on discharge Chronic anemia -Due to chronic disease, stable Age indeterminant DVT of the gastrocnemius veins of the right lower extremity-Diagnosed on 7/25 on lower extremity ultrasound, start Xarelto minimum of 3 months  Discharge Instructions   Allergies as of 09/06/2016      Reactions   Quetiapine Other (See Comments)   Severe muscle spasms and hallucinations   Tramadol Other (See Comments)   Muscle spasms, seizure and hallucinations   Penicillins       Medication List    TAKE these medications   baclofen 10 MG tablet Commonly known as:  LIORESAL Take 10 mg by mouth 3 (three) times daily.   clonazePAM 0.5 MG tablet Commonly known as:  KLONOPIN Take 0.25 mg by mouth every 8 (eight) hours.  doxycycline 100 MG capsule Commonly known as:  VIBRAMYCIN Take 1 capsule (100 mg total) by mouth 2 (two) times daily.   gabapentin 300 MG capsule Commonly known as:  NEURONTIN Take 600 mg by mouth 4 (four) times  daily - after meals and at bedtime.   hydrOXYzine 50 MG capsule Commonly known as:  VISTARIL Take 50-100 mg by mouth 3 (three) times daily as needed for anxiety or itching.   ipratropium-albuterol 0.5-2.5 (3) MG/3ML Soln Commonly known as:  DUONEB Take 3 mLs by nebulization every 6 (six) hours as needed (sob, wheezing).   LATUDA 80 MG Tabs tablet Generic drug:  lurasidone Take 80 mg by mouth at bedtime.   levETIRAcetam 500 MG tablet Commonly known as:  KEPPRA Take 1,000 mg by mouth 2 (two) times daily.   methocarbamol 750 MG tablet Commonly known as:  ROBAXIN Take 1,500 mg by mouth 4 (four) times daily as needed for muscle spasms.   oxyCODONE-acetaminophen 10-325 MG tablet Commonly known as:  PERCOCET Take 1 tablet by mouth every 6 (six) hours as needed for pain.   PROAIR HFA 108 (90 Base) MCG/ACT inhaler Generic drug:  albuterol Inhale 2 puffs into the lungs every 6 (six) hours as needed for wheezing or shortness of breath.   Rivaroxaban 15 & 20 MG Tbpk Take as directed on package: Start with one 15mg  tablet by mouth twice a day with food. On Day 22, switch to one 20mg  tablet once a day with food.   sertraline 100 MG tablet Commonly known as:  ZOLOFT Take 100 mg by mouth daily.   SUBOXONE 8-2 MG Film Generic drug:  Buprenorphine HCl-Naloxone HCl Place 1 Film under the tongue 3 (three) times daily.   sulfamethoxazole-trimethoprim 800-160 MG tablet Commonly known as:  BACTRIM DS,SEPTRA DS Take 1 tablet by mouth 2 (two) times daily.   traZODone 100 MG tablet Commonly known as:  DESYREL Take 400 mg by mouth at bedtime.      Follow-up Information    PCP. Schedule an appointment as soon as possible for a visit in 1 week(s).          Allergies  Allergen Reactions  . Quetiapine Other (See Comments)    Severe muscle spasms and hallucinations  . Tramadol Other (See Comments)    Muscle spasms, seizure and hallucinations  . Penicillins      Consultations:  Orthopedic surgery  Infectious disease  Procedures/Studies:  Dg Chest 2 View  Result Date: 09/04/2016 CLINICAL DATA:  Mild hypoxia. EXAM: CHEST  2 VIEW COMPARISON:  04/08/2016 FINDINGS: The lungs are clear. The pulmonary vasculature is normal. Heart size is normal. Hilar and mediastinal contours are unremarkable. There is no pleural effusion. IMPRESSION: No active cardiopulmonary disease. Electronically Signed   By: Ellery Plunk M.D.   On: 09/04/2016 07:03   Mr Knee Right W Wo Contrast  Result Date: 09/04/2016 CLINICAL DATA:  Recent admission for right knee prepatellar bursitis. Arthrocentesis was negative for septic arthritis at that time. Currently on antibiotics. Presenting with worsening right knee pain and edema. EXAM: MRI OF THE RIGHT KNEE WITHOUT CONTRAST TECHNIQUE: Multiplanar, multisequence MR imaging of the right knee was performed without intravenous contrast. COMPARISON:  Right knee x-rays dated September 04, 2016. FINDINGS: MENISCI Medial meniscus:  Intact with normal morphology. Lateral meniscus:  Intact with normal morphology. LIGAMENTS Cruciates:  Intact. Collaterals:  Intact. CARTILAGE Patellofemoral:  Preserved. Medial:  Preserved. Lateral:  Preserved. MISCELLANEOUS Joint:  Small joint effusion. Popliteal Fossa:  Unremarkable. No significant  Baker's cyst. Extensor Mechanism: Intact. Mild increased intermediate signal within the proximal patellar tendon. Bones: Faintly decreased T1 marrow signal with diffusely increased marrow signal on all fat suppressed images. Other:  Mild diffuse soft tissue swelling about the knee. IMPRESSION: 1. Mild diffuse soft tissue swelling about the knee, nonspecific, but can be seen in the setting of cellulitis. 2. Small joint effusion, also nonspecific. If there is clinical concern for septic arthritis, recommend arthrocentesis for further evaluation. 3. Diffusely increased marrow signal on all fat suppressed images, which can be  seen in the setting of gelatinous marrow transformation. 4. Mild proximal patellar tendinosis. Electronically Signed   By: William T Derry M.D.   On: 07/25/201Obie Dredge8 12:33   Dg Knee Complete 4 Views Right  Result Date: 09/04/2016 CLINICAL DATA:  Continued right knee swelling and pain. EXAM: RIGHT KNEE - COMPLETE 4+ VIEW COMPARISON:  08/24/2016. FINDINGS: Persistent soft tissue swelling over the anterior knee. Tiny knee joint effusion cannot be excluded. No associated bony abnormality identified. No evidence fracture dislocation . IMPRESSION: Persistent soft tissue swelling over the anterior knee. Tiny knee joint effusion cannot be excluded. No acute or focal bony abnormality identified. MRI of the right knee may prove useful for further evaluation . Electronically Signed   By: Maisie Fushomas  Register   On: 09/04/2016 06:13   Dg Knee Complete 4 Views Right  Result Date: 08/24/2016 CLINICAL DATA:  36 year old female with fall and right knee pain. EXAM: RIGHT KNEE - COMPLETE 4+ VIEW COMPARISON:  Radiograph dated 08/16/2016 FINDINGS: There is no acute fracture or dislocation. The bones are well mineralized. No arthritic changes. Small suprapatellar effusion. There is diffuse soft tissue swelling over the anterior knee, new compared to the prior study. Correlation with clinical exam is recommended to evaluate for an infectious process. IMPRESSION: 1. No acute osseous pathology. 2. Diffuse soft tissue swelling of the anterior knee with probable small suprapatellar effusion. Clinical correlation is recommended to evaluate for an infectious process. Electronically Signed   By: Elgie CollardArash  Radparvar M.D.   On: 08/24/2016 05:56      Subjective: - no chest pain, shortness of breath, no abdominal pain, nausea or vomiting.   Discharge Exam: Vitals:   09/05/16 2025 09/06/16 0559  BP: 103/67 (!) 95/55  Pulse: 67 68  Resp: 15 15  Temp: (!) 97.5 F (36.4 C) 97.6 F (36.4 C)   Vitals:   09/05/16 0800 09/05/16 1439 09/05/16  2025 09/06/16 0559  BP: 105/74 (!) 111/51 103/67 (!) 95/55  Pulse: 68 72 67 68  Resp: 11 14 15 15   Temp: 97.6 F (36.4 C) 98.7 F (37.1 C) (!) 97.5 F (36.4 C) 97.6 F (36.4 C)  TempSrc: Oral Oral Oral Oral  SpO2: 92% 97% 97% 97%  Weight:      Height:        General: Pt is alert, awake, not in acute distress Cardiovascular: RRR, S1/S2 +, no rubs, no gallops Respiratory: CTA bilaterally, no wheezing, no rhonchi Abdominal: Soft, NT, ND, bowel sounds + Extremities: no edema, no cyanosis    The results of significant diagnostics from this hospitalization (including imaging, microbiology, ancillary and laboratory) are listed below for reference.     Microbiology: Recent Results (from the past 240 hour(s))  Blood culture (routine x 2)     Status: None (Preliminary result)   Collection Time: 09/04/16  6:36 AM  Result Value Ref Range Status   Specimen Description BLOOD RIGHT FOREARM  Final   Special Requests   Final  BOTTLES DRAWN AEROBIC AND ANAEROBIC Blood Culture adequate volume   Culture   Final    NO GROWTH 2 DAYS Performed at [facility], [address], [place], [place] [address]    Report Status PENDING  Incomplete  Blood culture (routine x 2)     Status: None (Preliminary result)   Collection Time: 09/04/16  7:08 AM  Result Value Ref Range Status   Specimen Description BLOOD LEFT FOREARM  Final   Special Requests   Final    BOTTLES DRAWN AEROBIC AND ANAEROBIC Blood Culture adequate volume   Culture   Final    NO GROWTH 2 DAYS Performed at [facility], [address], [place], [place] [address]    Report Status PENDING  Incomplete  Urine culture     Status: Abnormal   Collection Time: 09/04/16 10:41 AM  Result Value Ref Range Status   Specimen Description URINE, CLEAN CATCH  Final   Special Requests NONE  Final   Culture 10,000 COLONIES/mL ESCHERICHIA COLI (A)  Final   Report Status 09/06/2016 FINAL  Final   Organism ID, Bacteria ESCHERICHIA  COLI (A)  Final      Susceptibility   Escherichia coli - MIC*    AMPICILLIN >=32 RESISTANT Resistant     CEFAZOLIN <=4 SENSITIVE Sensitive     CEFTRIAXONE <=1 SENSITIVE Sensitive     CIPROFLOXACIN <=0.25 SENSITIVE Sensitive     GENTAMICIN <=1 SENSITIVE Sensitive     IMIPENEM <=0.25 SENSITIVE Sensitive     NITROFURANTOIN <=16 SENSITIVE Sensitive     TRIMETH/SULFA >=320 RESISTANT Resistant     AMPICILLIN/SULBACTAM 16 INTERMEDIATE Intermediate     PIP/TAZO <=4 SENSITIVE Sensitive     Extended ESBL NEGATIVE Sensitive     * 10,000 COLONIES/mL ESCHERICHIA COLI     Labs: BNP (last 3 results) No results for input(s): BNP in the last 8760 hours. Basic Metabolic Panel:  Recent Labs Lab 09/04/16 0635 09/04/16 0648 09/05/16 0831 09/06/16 0542  NA 136 135 136 140  K 4.9 4.9 4.3 4.6  CL 99* 98* 105 110  CO2 29  --  25 25  GLUCOSE 84 81 87 89  BUN 20 19 24* 21*  CREATININE 1.06* 1.10* 1.34* 1.02*  CALCIUM 9.3  --  8.5* 8.3*   Liver Function Tests:  Recent Labs Lab 09/04/16 0635 09/05/16 0831  AST 17 15  ALT 12* 11*  ALKPHOS 62 55  BILITOT 0.3 0.2*  PROT 7.3 6.5  ALBUMIN 3.4* 2.9*   No results for input(s): LIPASE, AMYLASE in the last 168 hours. No results for input(s): AMMONIA in the last 168 hours. CBC:  Recent Labs Lab 09/04/16 0635 09/04/16 0648 09/05/16 0831  WBC 5.3  --  3.8*  NEUTROABS 3.4  --   --   HGB 9.5* 10.2* 8.5*  HCT 29.9* 30.0* 27.8*  MCV 66.6*  --  68.3*  PLT 332  --  279   Cardiac Enzymes: No results for input(s): CKTOTAL, CKMB, CKMBINDEX, TROPONINI in the last 168 hours. BNP: Invalid input(s): POCBNP CBG: No results for input(s): GLUCAP in the last 168 hours. D-Dimer No results for input(s): DDIMER in the last 72 hours. Hgb A1c No results for input(s): HGBA1C in the last 72 hours. Lipid Profile No results for input(s): CHOL, HDL, LDLCALC, TRIG, CHOLHDL, LDLDIRECT in the last 72 hours. Thyroid function studies No results for input(s):  TSH, T4TOTAL, T3FREE, THYROIDAB in the last 72 hours.  Invalid input(s): FREET3 Anemia work  up No results for input(s): VITAMINB12, FOLATE, FERRITIN, TIBC, IRON, RETICCTPCT in the last 72 hours. Urinalysis    Component Value Date/Time   COLORURINE COLORLESS (A) 09/04/2016 1041   APPEARANCEUR CLEAR 09/04/2016 1041   LABSPEC 1.005 09/04/2016 1041   PHURINE 8.0 09/04/2016 1041   GLUCOSEU NEGATIVE 09/04/2016 1041   HGBUR NEGATIVE 09/04/2016 1041   BILIRUBINUR NEGATIVE 09/04/2016 1041   KETONESUR NEGATIVE 09/04/2016 1041   PROTEINUR NEGATIVE 09/04/2016 1041   UROBILINOGEN 0.2 10/24/2013 0120   NITRITE NEGATIVE 09/04/2016 1041   LEUKOCYTESUR TRACE (A) 09/04/2016 1041   Sepsis Labs Invalid input(s): PROCALCITONIN,  WBC,  LACTICIDVEN Microbiology Recent Results (from the past 240 hour(s))  Blood culture (routine x 2)     Status: None (Preliminary result)   Collection Time: 09/04/16  6:36 AM  Result Value Ref Range Status   Specimen Description BLOOD RIGHT FOREARM  Final   Special Requests   Final    BOTTLES DRAWN AEROBIC AND ANAEROBIC Blood Culture adequate volume   Culture   Final    NO GROWTH 2 DAYS Performed at [facility], [address] [place], [place] [address]    Report Status PENDING  Incomplete  Blood culture (routine x 2)     Status: None (Preliminary result)   Collection Time: 09/04/16  7:08 AM  Result Value Ref Range Status   Specimen Description BLOOD LEFT FOREARM  Final   Special Requests   Final    BOTTLES DRAWN AEROBIC AND ANAEROBIC Blood Culture adequate volume   Culture   Final    NO GROWTH 2 DAYS Performed at [facility], [address] [place], [place] [address]    Report Status PENDING  Incomplete  Urine culture     Status: Abnormal   Collection Time: 09/04/16 10:41 AM  Result Value Ref Range Status   Specimen Description URINE, CLEAN CATCH  Final   Special Requests NONE  Final   Culture 10,000 COLONIES/mL ESCHERICHIA COLI (A)  Final    Report Status 09/06/2016 FINAL  Final   Organism ID, Bacteria ESCHERICHIA COLI (A)  Final      Susceptibility   Escherichia coli - MIC*    AMPICILLIN >=32 RESISTANT Resistant     CEFAZOLIN <=4 SENSITIVE Sensitive     CEFTRIAXONE <=1 SENSITIVE Sensitive     CIPROFLOXACIN <=0.25 SENSITIVE Sensitive     GENTAMICIN <=1 SENSITIVE Sensitive     IMIPENEM <=0.25 SENSITIVE Sensitive     NITROFURANTOIN <=16 SENSITIVE Sensitive     TRIMETH/SULFA >=320 RESISTANT Resistant     AMPICILLIN/SULBACTAM 16 INTERMEDIATE Intermediate     PIP/TAZO <=4 SENSITIVE Sensitive     Extended ESBL NEGATIVE Sensitive     * 10,000 COLONIES/mL ESCHERICHIA COLI     Time coordinating discharge: 35 minutes  SIGNED:  Pamella Pertostin Ardys Hataway, MD  [facility] 09/06/2016, 1:47 PM Pager [phone]  If 7PM-7AM, please contact night-coverage [email or web address] Password [email or web address]

## 2016-09-06 NOTE — Progress Notes (Signed)
Patient given discharge instructions, and verbalized an understanding of all discharge instructions.  Patient agrees with discharge plan, and is being discharged in stable medical condition.  Patient given transportation via wheelchair. 

## 2016-09-06 NOTE — Progress Notes (Signed)
[id number]/Rhonda Davis,BSN,RN3,CCM: Card for 30 day free supply of xarelto given patient with instructions.

## 2016-09-09 LAB — CULTURE, BLOOD (ROUTINE X 2)
CULTURE: NO GROWTH
CULTURE: NO GROWTH
SPECIAL REQUESTS: ADEQUATE
Special Requests: ADEQUATE

## 2016-09-18 ENCOUNTER — Ambulatory Visit (INDEPENDENT_AMBULATORY_CARE_PROVIDER_SITE_OTHER): Payer: Medicaid Other | Admitting: Infectious Diseases

## 2016-09-18 ENCOUNTER — Ambulatory Visit (INDEPENDENT_AMBULATORY_CARE_PROVIDER_SITE_OTHER): Payer: PRIVATE HEALTH INSURANCE | Admitting: Licensed Clinical Social Worker

## 2016-09-18 ENCOUNTER — Encounter: Payer: Self-pay | Admitting: Infectious Diseases

## 2016-09-18 DIAGNOSIS — R45851 Suicidal ideations: Secondary | ICD-10-CM

## 2016-09-18 DIAGNOSIS — F319 Bipolar disorder, unspecified: Secondary | ICD-10-CM

## 2016-09-18 DIAGNOSIS — M009 Pyogenic arthritis, unspecified: Secondary | ICD-10-CM

## 2016-09-18 DIAGNOSIS — Z915 Personal history of self-harm: Secondary | ICD-10-CM

## 2016-09-18 DIAGNOSIS — IMO0002 Reserved for concepts with insufficient information to code with codable children: Secondary | ICD-10-CM

## 2016-09-18 NOTE — Assessment & Plan Note (Signed)
She denies being currently suicidal. States that this comes and goes.  She is meeting with our counselor.

## 2016-09-18 NOTE — Assessment & Plan Note (Signed)
Her knee is hot and tender. Although there is not a large effusion, I think she needs to be seen in the ED.  I am hopeful she can be seen by ortho.

## 2016-09-18 NOTE — Progress Notes (Signed)
Integrated Behavioral Health Initial Visit  MRN: [id number] Name: Julie Sanford   Session Start time: 11:00 am Session End time: 11:20 am Total time: 20 minutes  Type of Service: Integrated Behavioral Health- Individual/Family Interpretor:No. Interpretor Name and Language: N/A   Warm Hand Off Completed.       SUBJECTIVE: Julie Sanford is a 36 y.o. female accompanied by patient. Patient was referred by Dr. Ninetta LightsHatcher for fleeting suicidal ideations.  Patient reports the following symptoms/concerns: Patient reported fleeting suicidal ideations, based on her diagnosis of Bipolar Disorder.  Patient reported that she has experienced fleeting suicidal ideations for the past 15 years in managing her mental health.  Today the patient denied current suicidal ideations, plan, or intent.  Patient reported last experience of suicidal ideations was 5 months ago where she attempted to overdose on Heroin.  Patient reported that she is currently engaged in mental health treatment and is prescribed multiple psychotropic medications and is compliant.  She also reported that she meets with her counselor from [facility] every Wednesday and Saturday, and she will meet with the counselor today.  Additionally the patient reported that she knows how to access emergency services if the ideations return, and will report to the nearest emergency department and call the suicide hotline number.  The patient lives with her mother and she stated that her mother can also tell when her ideations return because of the patient's change in behaviors and mood, and that the mother knows to call 911.  Patient reported history of Crack Cocaine use and was not able to state her last use.  Patient reported that she is currently receiving drug treatment and was aware of various mental health and [place].  Duration of problem: 15 years; Severity of problem: moderate  OBJECTIVE: Mood:  Anxious and Depressed and Affect: within range Risk of harm to self or others: No plan to harm self or others  Thought process: circumstantial Thought content: logical  ASSESSMENT: Patient is currently experiencing depressed mood and fleeting suicidal ideations and may benefit from psychiatric evaluation from the [facility].  GOALS ADDRESSED: Patient will reduce symptoms of: anxiety, depression and suicidal ideations and increase knowledge and/or ability of: coping skills, healthy habits and self-management skills and also: Increase healthy adjustment to current life circumstances   INTERVENTIONS: Solution-Focused Strategies and Link to [employer]   PLAN: 1. Dr. Ninetta LightsHatcher has asked patient to report to [facility] for evaluation today.  Patient stated that she needed to make arrangements before going to the hospital.  Vergia AlbertsSherry Tysha Grismore, [facility]

## 2016-09-18 NOTE — Progress Notes (Signed)
   Subjective:    Patient ID: Julie Sanford, female    DOB: 10/16/1980, 36 y.o.   MRN: [id number]  HPI 36 yo F with hx of bipolar, was seen 7-14 in ED with R knee swelling. She had fallen on knee 5 days PTA.  She developed worsening swelling and pain and came to ED. Plain films showed soft tissue swelling, she was treated with vancomycin.  Sh had arthrocentesis showing 410 WBC (85% N), no crystals).  She was d/c home on 7-18 on bactrim.   She returns on 7-25 with worsening pain and erythema extending to her foot. She was hypotensive, drowsy and urine tox was + for opioids.  She was started on vanco/zosyn. Seen by ortho and not felt to have a septic knee. She had MRI of her knee showing: 1. Mild diffuse soft tissue swelling about the knee, nonspecific, but can be seen in the setting of cellulitis. 2. Small joint effusion, also nonspecific. If there is clinical concern for septic arthritis, recommend arthrocentesis for further evaluation. 3. Diffusely increased marrow signal on all fat suppressed images, which can be seen in the setting of gelatinous marrow transformation. 4. Mild proximal patellar tendinosis. She was found to have a DVT in her RLE/gastrocnemius.   HIV (-) 7-14. Hep C Ab+.   She improved in hospital and was d/c home on doxy for 10 days on 09-06-16. She was also d/c on xarelto for 3 months.   Today she complains of persistent swelling in her R knee. She feels like it is throbbing, like it has a heartbeat. She is having pain negotiating steps. Unable to bend her knee to get into the car.  States she is having f/c off and on.  She has completed the doxy.   Review of Systems  Constitutional: Positive for appetite change. Negative for chills and fever.  Gastrointestinal: Positive for constipation. Negative for diarrhea.  Genitourinary: Negative for difficulty urinating.  Musculoskeletal: Positive for joint swelling and myalgias.  urinary straining.  States she moves her  bowels qoweek.      Objective:   Physical Exam  Constitutional: She appears well-developed and well-nourished.  HENT:  Mouth/Throat: No oropharyngeal exudate.  Eyes: Pupils are equal, round, and reactive to light. EOM are normal.  Neck: Neck supple.  Cardiovascular: Normal rate, regular rhythm and normal heart sounds.   Pulmonary/Chest: Effort normal and breath sounds normal.  Abdominal: Soft. Bowel sounds are normal. There is no tenderness.  The wound on her R thumb is well healed. No fluctuance. Mild erythema.   Musculoskeletal:       Legs: Lymphadenopathy:    She has no cervical adenopathy.         Assessment & Plan:

## 2016-09-19 ENCOUNTER — Telehealth: Payer: Self-pay | Admitting: *Deleted

## 2016-09-19 NOTE — Telephone Encounter (Signed)
Patient called to find out what her next step is. She advised she went to the ED yesterday as she was advised and was told there was nothing in the system and nothing was done for her. Asked if she was worked up at all as there is nothing in the system and she advised yes. Advised she needs to be seen for her knee as soon as possible. Advised her will have to ask Dr Ninetta LightsHatcher and give her a call back once he responds as he is out of the office at this time.

## 2016-09-19 NOTE — Telephone Encounter (Signed)
Patient calling again about seeing Dr Ninetta LightsHatcher. Advised message has been sent and will call her once he responds.

## 2016-09-23 ENCOUNTER — Telehealth: Payer: Self-pay | Admitting: *Deleted

## 2016-09-23 NOTE — Telephone Encounter (Signed)
Advised the patient that she was to stay and be seen at the ED she advised there were to many people in the ED and she did not want to wait. She advised she fell this weekend and hurt her knee even more and is in lots of pain. Asked if she had made an appointment with her PCP she advised she does not have a PCP.  Advised her to go to the hospital to be evaluated like Dr Ninetta LightsHatcher advised. She was very tearful and advised she is tired of dealing with this knee as she gets worse then better and she does not want to go to the hospital again. Advised her if she does not go it will only get worse but the choice is hers. She advised she will call for a ride and go tonight.

## 2016-09-23 NOTE — Telephone Encounter (Signed)
She was to go to the ED on 8-8 and be eval for septic arthritis.  If there she was seen and nothing was done, she needs ortho f/u within a week.  thanks

## 2016-09-23 NOTE — Telephone Encounter (Signed)
Called the patient and had to leave a message for her to call the office. 

## 2016-09-24 ENCOUNTER — Telehealth: Payer: Self-pay | Admitting: *Deleted

## 2016-09-24 NOTE — Telephone Encounter (Signed)
Patient called stating she hit her infected knee and feels that it needs to be, "drained or something".  She was requesting an appt with Dr. Ninetta LightsHatcher today. Dr. Ninetta LightsHatcher is not in clinic and patient is advised to go to the ED for evaluation. She asked if she could pick up a paper with her medications on it and I advised her that the ED can access her medications by going online in [facility]. Patient agreed to go to the ED. Wendall MolaJacqueline Cockerham

## 2016-09-26 ENCOUNTER — Observation Stay (HOSPITAL_COMMUNITY)
Admission: EM | Admit: 2016-09-26 | Discharge: 2016-09-27 | Disposition: A | Discharge status: Home / Self Care | Payer: Medicaid Other | Attending: Internal Medicine | Admitting: Internal Medicine

## 2016-09-26 ENCOUNTER — Encounter (HOSPITAL_COMMUNITY): Payer: Self-pay

## 2016-09-26 ENCOUNTER — Emergency Department (HOSPITAL_COMMUNITY): Payer: Medicaid Other

## 2016-09-26 DIAGNOSIS — F319 Bipolar disorder, unspecified: Secondary | ICD-10-CM

## 2016-09-26 DIAGNOSIS — F1120 Opioid dependence, uncomplicated: Secondary | ICD-10-CM | POA: Insufficient documentation

## 2016-09-26 DIAGNOSIS — F17200 Nicotine dependence, unspecified, uncomplicated: Secondary | ICD-10-CM | POA: Insufficient documentation

## 2016-09-26 DIAGNOSIS — Z79899 Other long term (current) drug therapy: Secondary | ICD-10-CM | POA: Diagnosis not present

## 2016-09-26 DIAGNOSIS — L03115 Cellulitis of right lower limb: Secondary | ICD-10-CM | POA: Diagnosis present

## 2016-09-26 DIAGNOSIS — M719 Bursopathy, unspecified: Secondary | ICD-10-CM | POA: Diagnosis present

## 2016-09-26 DIAGNOSIS — D649 Anemia, unspecified: Secondary | ICD-10-CM | POA: Insufficient documentation

## 2016-09-26 DIAGNOSIS — W19XXXA Unspecified fall, initial encounter: Secondary | ICD-10-CM

## 2016-09-26 DIAGNOSIS — M7041 Prepatellar bursitis, right knee: Secondary | ICD-10-CM | POA: Diagnosis not present

## 2016-09-26 DIAGNOSIS — R0902 Hypoxemia: Secondary | ICD-10-CM | POA: Diagnosis not present

## 2016-09-26 DIAGNOSIS — R52 Pain, unspecified: Secondary | ICD-10-CM | POA: Diagnosis not present

## 2016-09-26 DIAGNOSIS — Z880 Allergy status to penicillin: Secondary | ICD-10-CM | POA: Insufficient documentation

## 2016-09-26 DIAGNOSIS — S6992XA Unspecified injury of left wrist, hand and finger(s), initial encounter: Secondary | ICD-10-CM

## 2016-09-26 LAB — CBC WITH DIFFERENTIAL/PLATELET
BASOS ABS: 0 10*3/uL (ref 0.0–0.1)
Basophils Relative: 0 %
EOS ABS: 0.1 10*3/uL (ref 0.0–0.7)
Eosinophils Relative: 1 %
HEMATOCRIT: 30.3 % — AB (ref 36.0–46.0)
HEMOGLOBIN: 9.5 g/dL — AB (ref 12.0–15.0)
LYMPHS PCT: 29 %
Lymphs Abs: 1.5 10*3/uL (ref 0.7–4.0)
MCH: 22.6 pg — ABNORMAL LOW (ref 26.0–34.0)
MCHC: 31.4 g/dL (ref 30.0–36.0)
MCV: 72.1 fL — ABNORMAL LOW (ref 78.0–100.0)
MONOS PCT: 6 %
Monocytes Absolute: 0.3 10*3/uL (ref 0.1–1.0)
NEUTROS ABS: 3.2 10*3/uL (ref 1.7–7.7)
NEUTROS PCT: 64 %
Platelets: 164 10*3/uL (ref 150–400)
RBC: 4.2 MIL/uL (ref 3.87–5.11)
RDW: 21.1 % — ABNORMAL HIGH (ref 11.5–15.5)
WBC: 5.1 10*3/uL (ref 4.0–10.5)

## 2016-09-26 LAB — BASIC METABOLIC PANEL
Anion gap: 7 (ref 5–15)
BUN: 10 mg/dL (ref 6–20)
CALCIUM: 8.6 mg/dL — AB (ref 8.9–10.3)
CO2: 29 mmol/L (ref 22–32)
Chloride: 101 mmol/L (ref 101–111)
Creatinine, Ser: 0.8 mg/dL (ref 0.44–1.00)
GFR calc non Af Amer: >60 mL/min (ref >60)
GLUCOSE: 97 mg/dL (ref 65–99)
Potassium: 4 mmol/L (ref 3.5–5.1)
Sodium: 137 mmol/L (ref 135–145)

## 2016-09-26 LAB — MRSA PCR SCREENING: MRSA BY PCR: NEGATIVE

## 2016-09-26 MED ORDER — ONDANSETRON HCL 4 MG PO TABS: 4.0000 mg | ORAL_TABLET | Freq: Four times a day (QID) | ORAL | Status: DC | PRN

## 2016-09-26 MED ORDER — DEXTROSE 5 % IV SOLN
1.0000 g | Freq: Three times a day (TID) | INTRAVENOUS | Status: DC
  Administered 2016-09-26 – 2016-09-27 (×3): 1 g via INTRAVENOUS
  Filled 2016-09-26 (×4): qty 1

## 2016-09-26 MED ORDER — BUPRENORPHINE HCL 8 MG SL SUBL
8.0000 mg | SUBLINGUAL_TABLET | Freq: Every day | SUBLINGUAL | Status: DC
  Administered 2016-09-26: 8 mg via SUBLINGUAL
  Filled 2016-09-26: qty 1

## 2016-09-26 MED ORDER — BACLOFEN 10 MG PO TABS
10.0000 mg | ORAL_TABLET | Freq: Three times a day (TID) | ORAL | Status: DC
  Administered 2016-09-26 – 2016-09-27 (×3): 10 mg via ORAL
  Filled 2016-09-26 (×3): qty 1

## 2016-09-26 MED ORDER — CHLORHEXIDINE GLUCONATE 0.12 % MT SOLN
15.0000 mL | Freq: Two times a day (BID) | OROMUCOSAL | Status: DC
  Filled 2016-09-26: qty 15

## 2016-09-26 MED ORDER — ORAL CARE MOUTH RINSE: 15.0000 mL | Freq: Two times a day (BID) | OROMUCOSAL | Status: DC

## 2016-09-26 MED ORDER — KETOROLAC TROMETHAMINE 15 MG/ML IJ SOLN
15.0000 mg | Freq: Four times a day (QID) | INTRAMUSCULAR | Status: DC | PRN
Start: 2016-09-26 — End: 2016-09-27
  Filled 2016-09-26: qty 1

## 2016-09-26 MED ORDER — OXYCODONE HCL 5 MG PO TABS
5.0000 mg | ORAL_TABLET | Freq: Four times a day (QID) | ORAL | Status: DC | PRN
  Administered 2016-09-26 – 2016-09-27 (×3): 5 mg via ORAL
  Filled 2016-09-26 (×3): qty 1

## 2016-09-26 MED ORDER — SODIUM CHLORIDE 0.9 % IV BOLUS (SEPSIS)
1000.0000 mL | Freq: Once | INTRAVENOUS | Status: AC
  Administered 2016-09-26: 1000 mL via INTRAVENOUS

## 2016-09-26 MED ORDER — CLONAZEPAM 0.5 MG PO TABS
0.2500 mg | ORAL_TABLET | Freq: Three times a day (TID) | ORAL | Status: DC
  Administered 2016-09-26 – 2016-09-27 (×3): 0.25 mg via ORAL
  Filled 2016-09-26 (×3): qty 1

## 2016-09-26 MED ORDER — METHOCARBAMOL 500 MG PO TABS
1500.0000 mg | ORAL_TABLET | Freq: Four times a day (QID) | ORAL | Status: DC | PRN
  Administered 2016-09-26: 1500 mg via ORAL
  Filled 2016-09-26: qty 3

## 2016-09-26 MED ORDER — OXYCODONE-ACETAMINOPHEN 5-325 MG PO TABS
1.0000 | ORAL_TABLET | Freq: Four times a day (QID) | ORAL | Status: DC | PRN
  Administered 2016-09-26 – 2016-09-27 (×3): 1 via ORAL
  Filled 2016-09-26 (×3): qty 1

## 2016-09-26 MED ORDER — LEVETIRACETAM 500 MG PO TABS
1000.0000 mg | ORAL_TABLET | Freq: Two times a day (BID) | ORAL | Status: DC
  Administered 2016-09-26 – 2016-09-27 (×3): 1000 mg via ORAL
  Filled 2016-09-26 (×3): qty 2

## 2016-09-26 MED ORDER — LURASIDONE HCL 40 MG PO TABS
80.0000 mg | ORAL_TABLET | Freq: Every day | ORAL | Status: DC
  Administered 2016-09-26: 80 mg via ORAL
  Filled 2016-09-26: qty 2

## 2016-09-26 MED ORDER — NICOTINE 21 MG/24HR TD PT24
21.0000 mg | MEDICATED_PATCH | Freq: Every day | TRANSDERMAL | Status: DC
  Administered 2016-09-26: 21 mg via TRANSDERMAL
  Filled 2016-09-26: qty 1

## 2016-09-26 MED ORDER — SODIUM CHLORIDE 0.9 % IV SOLN
INTRAVENOUS | Status: DC
  Administered 2016-09-26: 16:00:00 via INTRAVENOUS

## 2016-09-26 MED ORDER — HYDROCODONE-ACETAMINOPHEN 5-325 MG PO TABS
2.0000 | ORAL_TABLET | Freq: Once | ORAL | Status: AC
  Administered 2016-09-26: 2 via ORAL
  Filled 2016-09-26: qty 2

## 2016-09-26 MED ORDER — SERTRALINE HCL 100 MG PO TABS
100.0000 mg | ORAL_TABLET | Freq: Every day | ORAL | Status: DC
  Administered 2016-09-26 – 2016-09-27 (×2): 100 mg via ORAL
  Filled 2016-09-26 (×2): qty 1

## 2016-09-26 MED ORDER — PANTOPRAZOLE SODIUM 40 MG PO TBEC
40.0000 mg | DELAYED_RELEASE_TABLET | Freq: Every day | ORAL | Status: DC
  Administered 2016-09-26 – 2016-09-27 (×2): 40 mg via ORAL
  Filled 2016-09-26 (×2): qty 1

## 2016-09-26 MED ORDER — SODIUM CHLORIDE 0.9 % IV BOLUS (SEPSIS): 1000.0000 mL | Freq: Once | INTRAVENOUS | Status: DC

## 2016-09-26 MED ORDER — VANCOMYCIN HCL 500 MG IV SOLR
500.0000 mg | Freq: Two times a day (BID) | INTRAVENOUS | Status: DC
  Administered 2016-09-26 – 2016-09-27 (×2): 500 mg via INTRAVENOUS
  Filled 2016-09-26 (×2): qty 500

## 2016-09-26 MED ORDER — ONDANSETRON HCL 4 MG/2ML IJ SOLN: 4.0000 mg | Freq: Four times a day (QID) | INTRAMUSCULAR | Status: DC | PRN

## 2016-09-26 MED ORDER — IPRATROPIUM-ALBUTEROL 0.5-2.5 (3) MG/3ML IN SOLN: 3.0000 mL | Freq: Four times a day (QID) | RESPIRATORY_TRACT | Status: DC | PRN

## 2016-09-26 MED ORDER — BUPRENORPHINE HCL 8 MG SL SUBL
8.0000 mg | SUBLINGUAL_TABLET | Freq: Three times a day (TID) | SUBLINGUAL | Status: DC
  Administered 2016-09-26 – 2016-09-27 (×2): 8 mg via SUBLINGUAL
  Filled 2016-09-26 (×2): qty 1

## 2016-09-26 MED ORDER — HYDROXYZINE HCL 25 MG PO TABS
50.0000 mg | ORAL_TABLET | Freq: Three times a day (TID) | ORAL | Status: DC | PRN
  Administered 2016-09-26: 100 mg via ORAL
  Filled 2016-09-26: qty 4

## 2016-09-26 MED ORDER — VANCOMYCIN HCL IN DEXTROSE 750-5 MG/150ML-% IV SOLN
750.0000 mg | Freq: Once | INTRAVENOUS | Status: AC
  Administered 2016-09-26: 750 mg via INTRAVENOUS
  Filled 2016-09-26: qty 150

## 2016-09-26 MED ORDER — RIVAROXABAN 20 MG PO TABS
20.0000 mg | ORAL_TABLET | Freq: Every day | ORAL | Status: DC
  Administered 2016-09-26: 20 mg via ORAL
  Filled 2016-09-26: qty 1

## 2016-09-26 MED ORDER — DEXTROSE 5 % IV SOLN
2.0000 g | Freq: Once | INTRAVENOUS | Status: AC
  Administered 2016-09-26: 2 g via INTRAVENOUS
  Filled 2016-09-26: qty 2

## 2016-09-26 MED ORDER — GABAPENTIN 300 MG PO CAPS
600.0000 mg | ORAL_CAPSULE | Freq: Three times a day (TID) | ORAL | Status: DC
  Administered 2016-09-26 – 2016-09-27 (×3): 600 mg via ORAL
  Filled 2016-09-26 (×3): qty 2
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
## 2016-09-26 NOTE — ED Notes (Signed)
Bed: WTR6 Expected date:  Expected time:  Means of arrival:  Comments: 
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
## 2016-09-26 NOTE — Progress Notes (Signed)
Pharmacy Antibiotic Note  Julie FriesChristina M Blaze is a 36 y.o. female admitted on 09/26/2016 with wound infection, cellulitis.  Pharmacy has been consulted for Vancomycin, cefepime  dosing.  Plan: Vancomycin 500mg  IV every 12 hours.  Goal trough 10-15 mcg/mL.  Cefepime 1gm iv q8hr     Temp (24hrs), Avg:97.8 F (36.6 C), Min:97.8 F (36.6 C), Max:97.8 F (36.6 C)   Recent Labs Lab 09/26/16 0202  WBC 5.1  CREATININE 0.80    Estimated Creatinine Clearance: 67.4 mL/min (by C-G formula based on SCr of 0.8 mg/dL).    Allergies  Allergen Reactions  . Quetiapine Other (See Comments)    Severe muscle spasms and hallucinations  . Tramadol Other (See Comments)    Muscle spasms, seizure and hallucinations  . Penicillins     Can't remember, told as a child    Antimicrobials this admission: Vancomycin 09/26/2016 >> Cefepime 09/26/2016 >>   Dose adjustments this admission: -  Microbiology results: pending  Thank you for allowing pharmacy to be a part of this patient's care.  Aleene DavidsonGrimsley Jr, Abimbola Aki Crowford 09/26/2016 3:38 AM

## 2016-09-26 NOTE — Progress Notes (Signed)
Patient very drowsy, and when aroused fell back asleep quickly. VSS, MD aware. Will continue to monitor.

## 2016-09-26 NOTE — ED Triage Notes (Signed)
Pt complains of right knee pain, the knee and leg are red and swollen

## 2016-09-26 NOTE — Progress Notes (Signed)
Confirmed w/ [facility]-rep [facility]-they have tried to contact patient, patient is NOT active, but they will send a referral to follow in the community. [facility] [place].
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
## 2016-09-26 NOTE — ED Notes (Signed)
Admitting MD at bedside.

## 2016-09-26 NOTE — ED Notes (Signed)
Upon assessment of pt, IV was noted to be infiltrated. IV fluids stopped and IV removed.
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
## 2016-09-26 NOTE — Progress Notes (Signed)
Patient refusing to stay in be or chair, therefore alarm cannot be set. Patient educated on safety concern patient states "I cant sit still I'm in too much pain."

## 2016-09-26 NOTE — ED Triage Notes (Signed)
Pt called twice from triage with no answer 

## 2016-09-27 ENCOUNTER — Inpatient Hospital Stay (HOSPITAL_COMMUNITY): Payer: Medicaid Other

## 2016-09-27 DIAGNOSIS — M7041 Prepatellar bursitis, right knee: Secondary | ICD-10-CM | POA: Diagnosis not present

## 2016-09-27 DIAGNOSIS — M719 Bursopathy, unspecified: Secondary | ICD-10-CM | POA: Diagnosis present

## 2016-09-27 LAB — BASIC METABOLIC PANEL
Anion gap: 6 (ref 5–15)
BUN: 11 mg/dL (ref 6–20)
CALCIUM: 8.3 mg/dL — AB (ref 8.9–10.3)
CO2: 28 mmol/L (ref 22–32)
CREATININE: 0.72 mg/dL (ref 0.44–1.00)
Chloride: 104 mmol/L (ref 101–111)
GFR calc Af Amer: >60 mL/min (ref >60)
Glucose, Bld: 87 mg/dL (ref 65–99)
Potassium: 4.1 mmol/L (ref 3.5–5.1)
Sodium: 138 mmol/L (ref 135–145)

## 2016-09-27 LAB — CBC WITH DIFFERENTIAL/PLATELET
BASOS PCT: 0 %
Basophils Absolute: 0 10*3/uL (ref 0.0–0.1)
EOS PCT: 3 %
Eosinophils Absolute: 0.1 10*3/uL (ref 0.0–0.7)
HCT: 26.7 % — ABNORMAL LOW (ref 36.0–46.0)
Hemoglobin: 8.1 g/dL — ABNORMAL LOW (ref 12.0–15.0)
LYMPHS ABS: 1.3 10*3/uL (ref 0.7–4.0)
Lymphocytes Relative: 39 %
MCH: 22.4 pg — AB (ref 26.0–34.0)
MCHC: 30.3 g/dL (ref 30.0–36.0)
MCV: 73.8 fL — AB (ref 78.0–100.0)
MONO ABS: 0.3 10*3/uL (ref 0.1–1.0)
Monocytes Relative: 9 %
Neutro Abs: 1.7 10*3/uL (ref 1.7–7.7)
Neutrophils Relative %: 49 %
PLATELETS: 154 10*3/uL (ref 150–400)
RBC: 3.62 MIL/uL — AB (ref 3.87–5.11)
RDW: 21.4 % — AB (ref 11.5–15.5)
WBC: 3.4 10*3/uL — AB (ref 4.0–10.5)

## 2016-09-27 MED ORDER — SULFAMETHOXAZOLE-TRIMETHOPRIM 800-160 MG PO TABS: 1.0000 | ORAL_TABLET | Freq: Two times a day (BID) | ORAL | 0 refills | Status: AC

## 2016-09-27 MED ORDER — RIVAROXABAN 20 MG PO TABS: 20.0000 mg | ORAL_TABLET | Freq: Every day | ORAL | 0 refills | Status: DC

## 2016-09-27 MED ORDER — SULFAMETHOXAZOLE-TRIMETHOPRIM 800-160 MG PO TABS: 1.0000 | ORAL_TABLET | Freq: Two times a day (BID) | ORAL | 0 refills | Status: DC

## 2016-09-27 MED ORDER — KETOROLAC TROMETHAMINE 15 MG/ML IJ SOLN: 15.0000 mg | Freq: Four times a day (QID) | INTRAMUSCULAR | Status: DC | PRN

## 2016-09-27 MED ORDER — NICOTINE 21 MG/24HR TD PT24: 21.0000 mg | MEDICATED_PATCH | Freq: Every day | TRANSDERMAL | 0 refills | Status: DC

## 2016-09-27 MED ORDER — SULFAMETHOXAZOLE-TRIMETHOPRIM 800-160 MG PO TABS
1.0000 | ORAL_TABLET | Freq: Two times a day (BID) | ORAL | Status: DC
  Administered 2016-09-27: 1 via ORAL
  Filled 2016-09-27: qty 1

## 2016-09-27 NOTE — Progress Notes (Signed)
Assumed care of patient from previous RN. I agree with prior assessment and will continue to monitor patient. Patient continues to be very drowsy but does respond when name is called.

## 2016-09-27 NOTE — Discharge Instructions (Signed)
Bursitis Bursitis is when the fluid-filled sac (bursa) that covers and protects a joint is swollen (inflamed). Bursitis is most common near joints, especially the knees, elbows, hips, and shoulders. Follow these instructions at home:  Take medicines only as told by your doctor.  If you were prescribed an antibiotic medicine, finish it all even if you start to feel better.  Rest the affected area as told by your doctor. ? Keep the area raised up. ? Avoid doing things that make the pain worse.  Apply ice to the injured area: ? Place ice in a plastic bag. ? Place a towel between your skin and the bag. ? Leave the ice on for 20 minutes, 2-3 times a day.  Use splints, braces, pads, or walking aids as told by your doctor.  Keep all follow-up visits as told by your doctor. This is important. Contact a doctor if:  You have more pain with home care.  You have a fever.  You have chills. This information is not intended to replace advice given to you by your health care provider. Make sure you discuss any questions you have with your health care provider. Document Released: 07/18/2009 Document Revised: 07/06/2015 Document Reviewed: 04/19/2013 [employer] Interactive Patient Education  2018 [employer]  
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
## 2016-11-02 ENCOUNTER — Emergency Department (HOSPITAL_COMMUNITY): Payer: Medicaid Other

## 2016-11-02 ENCOUNTER — Inpatient Hospital Stay (HOSPITAL_COMMUNITY)
Admission: EM | Admit: 2016-11-02 | Discharge: 2016-11-07 | DRG: 205 | Disposition: A | Discharge status: Home / Self Care | Payer: Medicaid Other | Attending: Internal Medicine | Admitting: Internal Medicine

## 2016-11-02 ENCOUNTER — Encounter (HOSPITAL_COMMUNITY): Payer: Self-pay | Admitting: Emergency Medicine

## 2016-11-02 DIAGNOSIS — S0990XA Unspecified injury of head, initial encounter: Secondary | ICD-10-CM | POA: Diagnosis present

## 2016-11-02 DIAGNOSIS — R05 Cough: Secondary | ICD-10-CM | POA: Diagnosis present

## 2016-11-02 DIAGNOSIS — F1120 Opioid dependence, uncomplicated: Secondary | ICD-10-CM | POA: Diagnosis present

## 2016-11-02 DIAGNOSIS — E43 Unspecified severe protein-calorie malnutrition: Secondary | ICD-10-CM | POA: Diagnosis present

## 2016-11-02 DIAGNOSIS — E860 Dehydration: Secondary | ICD-10-CM | POA: Diagnosis present

## 2016-11-02 DIAGNOSIS — Z86718 Personal history of other venous thrombosis and embolism: Secondary | ICD-10-CM

## 2016-11-02 DIAGNOSIS — T07XXXA Unspecified multiple injuries, initial encounter: Secondary | ICD-10-CM

## 2016-11-02 DIAGNOSIS — Z7951 Long term (current) use of inhaled steroids: Secondary | ICD-10-CM

## 2016-11-02 DIAGNOSIS — Z888 Allergy status to other drugs, medicaments and biological substances status: Secondary | ICD-10-CM

## 2016-11-02 DIAGNOSIS — E876 Hypokalemia: Secondary | ICD-10-CM | POA: Diagnosis present

## 2016-11-02 DIAGNOSIS — F17210 Nicotine dependence, cigarettes, uncomplicated: Secondary | ICD-10-CM | POA: Diagnosis present

## 2016-11-02 DIAGNOSIS — F1910 Other psychoactive substance abuse, uncomplicated: Secondary | ICD-10-CM | POA: Diagnosis present

## 2016-11-02 DIAGNOSIS — N179 Acute kidney failure, unspecified: Secondary | ICD-10-CM | POA: Diagnosis present

## 2016-11-02 DIAGNOSIS — R911 Solitary pulmonary nodule: Principal | ICD-10-CM | POA: Diagnosis present

## 2016-11-02 DIAGNOSIS — R918 Other nonspecific abnormal finding of lung field: Secondary | ICD-10-CM | POA: Diagnosis present

## 2016-11-02 DIAGNOSIS — G40909 Epilepsy, unspecified, not intractable, without status epilepticus: Secondary | ICD-10-CM | POA: Diagnosis present

## 2016-11-02 DIAGNOSIS — F4310 Post-traumatic stress disorder, unspecified: Secondary | ICD-10-CM | POA: Diagnosis present

## 2016-11-02 DIAGNOSIS — X58XXXA Exposure to other specified factors, initial encounter: Secondary | ICD-10-CM | POA: Diagnosis present

## 2016-11-02 DIAGNOSIS — Z885 Allergy status to narcotic agent status: Secondary | ICD-10-CM

## 2016-11-02 DIAGNOSIS — Z79899 Other long term (current) drug therapy: Secondary | ICD-10-CM

## 2016-11-02 DIAGNOSIS — J984 Other disorders of lung: Secondary | ICD-10-CM

## 2016-11-02 DIAGNOSIS — F419 Anxiety disorder, unspecified: Secondary | ICD-10-CM | POA: Diagnosis present

## 2016-11-02 DIAGNOSIS — Z681 Body mass index (BMI) 19 or less, adult: Secondary | ICD-10-CM

## 2016-11-02 DIAGNOSIS — R059 Cough, unspecified: Secondary | ICD-10-CM | POA: Diagnosis present

## 2016-11-02 DIAGNOSIS — F319 Bipolar disorder, unspecified: Secondary | ICD-10-CM | POA: Diagnosis present

## 2016-11-02 DIAGNOSIS — Z7901 Long term (current) use of anticoagulants: Secondary | ICD-10-CM

## 2016-11-02 DIAGNOSIS — D638 Anemia in other chronic diseases classified elsewhere: Secondary | ICD-10-CM | POA: Diagnosis present

## 2016-11-02 DIAGNOSIS — Z23 Encounter for immunization: Secondary | ICD-10-CM

## 2016-11-02 DIAGNOSIS — Z880 Allergy status to penicillin: Secondary | ICD-10-CM

## 2016-11-02 LAB — CBG MONITORING, ED: Glucose-Capillary: 100 mg/dL — ABNORMAL HIGH (ref 65–99)

## 2016-11-02 MED ORDER — SODIUM CHLORIDE 0.9 % IV SOLN: INTRAVENOUS | Status: DC

## 2016-11-02 MED ORDER — DIPHENHYDRAMINE HCL 50 MG/ML IJ SOLN
12.5000 mg | Freq: Once | INTRAMUSCULAR | Status: AC
  Administered 2016-11-02: 12.5 mg via INTRAVENOUS
  Filled 2016-11-02: qty 1

## 2016-11-02 MED ORDER — HALOPERIDOL LACTATE 5 MG/ML IJ SOLN
2.0000 mg | Freq: Once | INTRAMUSCULAR | Status: AC
  Administered 2016-11-02: 2 mg via INTRAVENOUS
  Filled 2016-11-02: qty 1

## 2016-11-02 MED ORDER — SODIUM CHLORIDE 0.9 % IV BOLUS (SEPSIS)
1000.0000 mL | Freq: Once | INTRAVENOUS | Status: AC
  Administered 2016-11-02: 1000 mL via INTRAVENOUS
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
## 2016-11-03 ENCOUNTER — Emergency Department (HOSPITAL_COMMUNITY): Payer: Medicaid Other

## 2016-11-03 ENCOUNTER — Inpatient Hospital Stay (HOSPITAL_COMMUNITY): Payer: Medicaid Other

## 2016-11-03 ENCOUNTER — Encounter (HOSPITAL_COMMUNITY): Payer: Self-pay | Admitting: Emergency Medicine

## 2016-11-03 DIAGNOSIS — J984 Other disorders of lung: Secondary | ICD-10-CM | POA: Diagnosis not present

## 2016-11-03 DIAGNOSIS — D638 Anemia in other chronic diseases classified elsewhere: Secondary | ICD-10-CM | POA: Diagnosis present

## 2016-11-03 DIAGNOSIS — Z7951 Long term (current) use of inhaled steroids: Secondary | ICD-10-CM | POA: Diagnosis not present

## 2016-11-03 DIAGNOSIS — F4310 Post-traumatic stress disorder, unspecified: Secondary | ICD-10-CM | POA: Diagnosis present

## 2016-11-03 DIAGNOSIS — Z888 Allergy status to other drugs, medicaments and biological substances status: Secondary | ICD-10-CM | POA: Diagnosis not present

## 2016-11-03 DIAGNOSIS — F1910 Other psychoactive substance abuse, uncomplicated: Secondary | ICD-10-CM | POA: Diagnosis present

## 2016-11-03 DIAGNOSIS — Z86718 Personal history of other venous thrombosis and embolism: Secondary | ICD-10-CM | POA: Diagnosis not present

## 2016-11-03 DIAGNOSIS — E876 Hypokalemia: Secondary | ICD-10-CM | POA: Diagnosis present

## 2016-11-03 DIAGNOSIS — X58XXXA Exposure to other specified factors, initial encounter: Secondary | ICD-10-CM | POA: Diagnosis present

## 2016-11-03 DIAGNOSIS — E43 Unspecified severe protein-calorie malnutrition: Secondary | ICD-10-CM | POA: Diagnosis present

## 2016-11-03 DIAGNOSIS — R911 Solitary pulmonary nodule: Secondary | ICD-10-CM | POA: Diagnosis not present

## 2016-11-03 DIAGNOSIS — E860 Dehydration: Secondary | ICD-10-CM | POA: Diagnosis present

## 2016-11-03 DIAGNOSIS — R05 Cough: Secondary | ICD-10-CM

## 2016-11-03 DIAGNOSIS — Z79899 Other long term (current) drug therapy: Secondary | ICD-10-CM | POA: Diagnosis not present

## 2016-11-03 DIAGNOSIS — G40909 Epilepsy, unspecified, not intractable, without status epilepticus: Secondary | ICD-10-CM | POA: Diagnosis present

## 2016-11-03 DIAGNOSIS — R918 Other nonspecific abnormal finding of lung field: Secondary | ICD-10-CM | POA: Diagnosis present

## 2016-11-03 DIAGNOSIS — Z681 Body mass index (BMI) 19 or less, adult: Secondary | ICD-10-CM | POA: Diagnosis not present

## 2016-11-03 DIAGNOSIS — F319 Bipolar disorder, unspecified: Secondary | ICD-10-CM | POA: Diagnosis present

## 2016-11-03 DIAGNOSIS — F419 Anxiety disorder, unspecified: Secondary | ICD-10-CM | POA: Diagnosis present

## 2016-11-03 DIAGNOSIS — N179 Acute kidney failure, unspecified: Secondary | ICD-10-CM | POA: Diagnosis present

## 2016-11-03 DIAGNOSIS — Z885 Allergy status to narcotic agent status: Secondary | ICD-10-CM | POA: Diagnosis not present

## 2016-11-03 DIAGNOSIS — F1120 Opioid dependence, uncomplicated: Secondary | ICD-10-CM | POA: Diagnosis present

## 2016-11-03 DIAGNOSIS — Z23 Encounter for immunization: Secondary | ICD-10-CM | POA: Diagnosis not present

## 2016-11-03 DIAGNOSIS — S0990XA Unspecified injury of head, initial encounter: Secondary | ICD-10-CM | POA: Diagnosis present

## 2016-11-03 DIAGNOSIS — Z7901 Long term (current) use of anticoagulants: Secondary | ICD-10-CM | POA: Diagnosis not present

## 2016-11-03 DIAGNOSIS — F17210 Nicotine dependence, cigarettes, uncomplicated: Secondary | ICD-10-CM | POA: Diagnosis present

## 2016-11-03 DIAGNOSIS — R059 Cough, unspecified: Secondary | ICD-10-CM | POA: Diagnosis present

## 2016-11-03 DIAGNOSIS — Z880 Allergy status to penicillin: Secondary | ICD-10-CM | POA: Diagnosis not present

## 2016-11-03 LAB — COMPREHENSIVE METABOLIC PANEL
ALBUMIN: 4.6 g/dL (ref 3.5–5.0)
ALK PHOS: 61 U/L (ref 38–126)
ALT: 14 U/L (ref 14–54)
ANION GAP: 14 (ref 5–15)
AST: 32 U/L (ref 15–41)
BILIRUBIN TOTAL: 0.5 mg/dL (ref 0.3–1.2)
BUN: 25 mg/dL — AB (ref 6–20)
CALCIUM: 9.2 mg/dL (ref 8.9–10.3)
CO2: 21 mmol/L — ABNORMAL LOW (ref 22–32)
Chloride: 101 mmol/L (ref 101–111)
Creatinine, Ser: 1.38 mg/dL — ABNORMAL HIGH (ref 0.44–1.00)
GFR calc Af Amer: 57 mL/min — ABNORMAL LOW (ref >60)
GFR, EST NON AFRICAN AMERICAN: 49 mL/min — AB (ref >60)
GLUCOSE: 113 mg/dL — AB (ref 65–99)
Potassium: 3.3 mmol/L — ABNORMAL LOW (ref 3.5–5.1)
Sodium: 136 mmol/L (ref 135–145)
Total Protein: 8.6 g/dL — ABNORMAL HIGH (ref 6.5–8.1)

## 2016-11-03 LAB — CBC WITH DIFFERENTIAL/PLATELET
BASOS ABS: 0 10*3/uL (ref 0.0–0.1)
Basophils Relative: 0 %
EOS ABS: 0 10*3/uL (ref 0.0–0.7)
EOS PCT: 0 %
HEMATOCRIT: 34.3 % — AB (ref 36.0–46.0)
Hemoglobin: 10.9 g/dL — ABNORMAL LOW (ref 12.0–15.0)
LYMPHS ABS: 0.6 10*3/uL — AB (ref 0.7–4.0)
LYMPHS PCT: 15 %
MCH: 22.6 pg — AB (ref 26.0–34.0)
MCHC: 31.8 g/dL (ref 30.0–36.0)
MCV: 71 fL — AB (ref 78.0–100.0)
MONO ABS: 0.2 10*3/uL (ref 0.1–1.0)
Monocytes Relative: 4 %
Neutro Abs: 3.6 10*3/uL (ref 1.7–7.7)
Neutrophils Relative %: 81 %
PLATELETS: 195 10*3/uL (ref 150–400)
RBC: 4.83 MIL/uL (ref 3.87–5.11)
RDW: 18.5 % — AB (ref 11.5–15.5)
WBC: 4.4 10*3/uL (ref 4.0–10.5)

## 2016-11-03 LAB — ACETAMINOPHEN LEVEL: Acetaminophen (Tylenol), Serum: <10 ug/mL — ABNORMAL LOW (ref 10–30)

## 2016-11-03 LAB — SALICYLATE LEVEL

## 2016-11-03 LAB — I-STAT BETA HCG BLOOD, ED (MC, WL, AP ONLY): I-stat hCG, quantitative: <5 m[IU]/mL (ref <5)

## 2016-11-03 LAB — SEDIMENTATION RATE: Sed Rate: 22 mm/hr (ref 0–22)

## 2016-11-03 LAB — ETHANOL

## 2016-11-03 MED ORDER — LEVOFLOXACIN 500 MG PO TABS
500.0000 mg | ORAL_TABLET | Freq: Every day | ORAL | Status: DC
  Administered 2016-11-03 – 2016-11-07 (×5): 500 mg via ORAL
  Filled 2016-11-03 (×5): qty 1

## 2016-11-03 MED ORDER — HYDROXYZINE HCL 50 MG PO TABS: 100.0000 mg | ORAL_TABLET | Freq: Three times a day (TID) | ORAL | Status: DC | PRN

## 2016-11-03 MED ORDER — BUPRENORPHINE HCL-NALOXONE HCL 8-2 MG SL SUBL
1.0000 | SUBLINGUAL_TABLET | Freq: Every day | SUBLINGUAL | Status: DC
  Administered 2016-11-03: 1 via SUBLINGUAL
  Filled 2016-11-03: qty 1

## 2016-11-03 MED ORDER — PNEUMOCOCCAL VAC POLYVALENT 25 MCG/0.5ML IJ INJ
0.5000 mL | INJECTION | INTRAMUSCULAR | Status: AC
  Administered 2016-11-05: 0.5 mL via INTRAMUSCULAR
  Filled 2016-11-03 (×2): qty 0.5

## 2016-11-03 MED ORDER — ACETAMINOPHEN 650 MG RE SUPP: 650.0000 mg | Freq: Four times a day (QID) | RECTAL | Status: DC | PRN

## 2016-11-03 MED ORDER — FLUTICASONE PROPIONATE 50 MCG/ACT NA SUSP
1.0000 | Freq: Two times a day (BID) | NASAL | Status: DC
  Administered 2016-11-03 – 2016-11-07 (×7): 1 via NASAL
  Filled 2016-11-03: qty 16

## 2016-11-03 MED ORDER — ALBUTEROL SULFATE (2.5 MG/3ML) 0.083% IN NEBU: 2.5000 mg | INHALATION_SOLUTION | Freq: Four times a day (QID) | RESPIRATORY_TRACT | Status: DC | PRN

## 2016-11-03 MED ORDER — SERTRALINE HCL 100 MG PO TABS
100.0000 mg | ORAL_TABLET | Freq: Every day | ORAL | Status: DC
  Administered 2016-11-03 – 2016-11-07 (×5): 100 mg via ORAL
  Filled 2016-11-03 (×4): qty 1
  Filled 2016-11-03: qty 2

## 2016-11-03 MED ORDER — CLONAZEPAM 0.5 MG PO TABS
0.2500 mg | ORAL_TABLET | Freq: Three times a day (TID) | ORAL | Status: DC
  Administered 2016-11-03 – 2016-11-07 (×12): 0.25 mg via ORAL
  Filled 2016-11-03 (×13): qty 1

## 2016-11-03 MED ORDER — BUPRENORPHINE HCL-NALOXONE HCL 2-0.5 MG SL SUBL: 1.0000 | SUBLINGUAL_TABLET | Freq: Every day | SUBLINGUAL | Status: DC

## 2016-11-03 MED ORDER — ALBUTEROL SULFATE HFA 108 (90 BASE) MCG/ACT IN AERS
2.0000 | INHALATION_SPRAY | Freq: Four times a day (QID) | RESPIRATORY_TRACT | Status: DC | PRN
  Administered 2016-11-03: 2 via RESPIRATORY_TRACT
  Filled 2016-11-03: qty 6.7

## 2016-11-03 MED ORDER — BUPRENORPHINE HCL-NALOXONE HCL 8-2 MG SL SUBL
1.0000 | SUBLINGUAL_TABLET | Freq: Three times a day (TID) | SUBLINGUAL | Status: DC
  Administered 2016-11-03 – 2016-11-07 (×11): 1 via SUBLINGUAL
  Filled 2016-11-03 (×12): qty 1

## 2016-11-03 MED ORDER — MOMETASONE FURO-FORMOTEROL FUM 100-5 MCG/ACT IN AERO
2.0000 | INHALATION_SPRAY | Freq: Two times a day (BID) | RESPIRATORY_TRACT | Status: DC
  Administered 2016-11-04 – 2016-11-06 (×6): 2 via RESPIRATORY_TRACT
  Filled 2016-11-03: qty 8.8

## 2016-11-03 MED ORDER — IPRATROPIUM-ALBUTEROL 0.5-2.5 (3) MG/3ML IN SOLN: 3.0000 mL | Freq: Four times a day (QID) | RESPIRATORY_TRACT | Status: DC | PRN

## 2016-11-03 MED ORDER — FLUTICASONE FUROATE-VILANTEROL 200-25 MCG/INH IN AEPB: 1.0000 | INHALATION_SPRAY | Freq: Every day | RESPIRATORY_TRACT | Status: DC

## 2016-11-03 MED ORDER — INFLUENZA VAC SPLIT QUAD 0.5 ML IM SUSY
0.5000 mL | PREFILLED_SYRINGE | INTRAMUSCULAR | Status: AC
  Administered 2016-11-05: 0.5 mL via INTRAMUSCULAR
  Filled 2016-11-03: qty 0.5

## 2016-11-03 MED ORDER — POTASSIUM CHLORIDE IN NACL 20-0.9 MEQ/L-% IV SOLN
INTRAVENOUS | Status: AC
  Administered 2016-11-03: 23:00:00 via INTRAVENOUS
  Filled 2016-11-03 (×2): qty 1000

## 2016-11-03 MED ORDER — BACLOFEN 10 MG PO TABS
10.0000 mg | ORAL_TABLET | Freq: Three times a day (TID) | ORAL | Status: DC
  Administered 2016-11-03 – 2016-11-07 (×12): 10 mg via ORAL
  Filled 2016-11-03 (×13): qty 1

## 2016-11-03 MED ORDER — IOPAMIDOL (ISOVUE-300) INJECTION 61%
INTRAVENOUS | Status: AC
  Administered 2016-11-03: 75 mL via INTRAVENOUS
  Filled 2016-11-03: qty 75

## 2016-11-03 MED ORDER — GUAIFENESIN ER 600 MG PO TB12
1200.0000 mg | ORAL_TABLET | Freq: Two times a day (BID) | ORAL | Status: DC
  Administered 2016-11-03 – 2016-11-07 (×9): 1200 mg via ORAL
  Filled 2016-11-03 (×9): qty 2

## 2016-11-03 MED ORDER — LURASIDONE HCL 20 MG PO TABS
60.0000 mg | ORAL_TABLET | Freq: Every day | ORAL | Status: DC
  Administered 2016-11-03: 60 mg via ORAL
  Filled 2016-11-03 (×2): qty 3

## 2016-11-03 MED ORDER — GABAPENTIN 300 MG PO CAPS
600.0000 mg | ORAL_CAPSULE | Freq: Three times a day (TID) | ORAL | Status: DC
  Administered 2016-11-03 – 2016-11-07 (×14): 600 mg via ORAL
  Filled 2016-11-03 (×16): qty 2

## 2016-11-03 MED ORDER — TRAZODONE HCL 100 MG PO TABS
400.0000 mg | ORAL_TABLET | Freq: Every day | ORAL | Status: DC
  Administered 2016-11-03 – 2016-11-06 (×3): 400 mg via ORAL
  Filled 2016-11-03 (×3): qty 4

## 2016-11-03 MED ORDER — RIVAROXABAN 20 MG PO TABS
20.0000 mg | ORAL_TABLET | Freq: Every day | ORAL | Status: DC
  Administered 2016-11-03 – 2016-11-06 (×4): 20 mg via ORAL
  Filled 2016-11-03 (×4): qty 1

## 2016-11-03 MED ORDER — ACETAMINOPHEN 325 MG PO TABS
650.0000 mg | ORAL_TABLET | Freq: Four times a day (QID) | ORAL | Status: DC | PRN
  Administered 2016-11-05: 650 mg via ORAL
  Filled 2016-11-03: qty 2

## 2016-11-03 MED ORDER — LEVETIRACETAM 500 MG PO TABS
1000.0000 mg | ORAL_TABLET | Freq: Two times a day (BID) | ORAL | Status: DC
  Administered 2016-11-03 – 2016-11-07 (×9): 1000 mg via ORAL
  Filled 2016-11-03 (×9): qty 2
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
## 2016-11-03 NOTE — ED Notes (Signed)
Patient transported to CT 

## 2016-11-03 NOTE — ED Notes (Signed)
Patient refusing in and out catheter. Patient currently sitting on bedpan trying to give specimen.

## 2016-11-03 NOTE — Progress Notes (Signed)
Call report to Amil Amen [phone] @ [phone].
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
## 2016-11-03 NOTE — ED Notes (Signed)
Patient refused in and out catheter and cannot give urine specimen into bedpan. RN aware.
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
## 2016-11-04 DIAGNOSIS — F319 Bipolar disorder, unspecified: Secondary | ICD-10-CM

## 2016-11-04 DIAGNOSIS — F1120 Opioid dependence, uncomplicated: Secondary | ICD-10-CM

## 2016-11-04 LAB — CBC
HCT: 32.2 % — ABNORMAL LOW (ref 36.0–46.0)
HEMOGLOBIN: 9.7 g/dL — AB (ref 12.0–15.0)
MCH: 22.5 pg — AB (ref 26.0–34.0)
MCHC: 30.1 g/dL (ref 30.0–36.0)
MCV: 74.5 fL — AB (ref 78.0–100.0)
Platelets: 192 10*3/uL (ref 150–400)
RBC: 4.32 MIL/uL (ref 3.87–5.11)
RDW: 19 % — ABNORMAL HIGH (ref 11.5–15.5)
WBC: 3.1 10*3/uL — ABNORMAL LOW (ref 4.0–10.5)

## 2016-11-04 LAB — COMPREHENSIVE METABOLIC PANEL
ALT: 13 U/L — ABNORMAL LOW (ref 14–54)
ANION GAP: 6 (ref 5–15)
AST: 21 U/L (ref 15–41)
Albumin: 3.9 g/dL (ref 3.5–5.0)
Alkaline Phosphatase: 56 U/L (ref 38–126)
BUN: 15 mg/dL (ref 6–20)
CHLORIDE: 108 mmol/L (ref 101–111)
CO2: 28 mmol/L (ref 22–32)
Calcium: 9 mg/dL (ref 8.9–10.3)
Creatinine, Ser: 0.79 mg/dL (ref 0.44–1.00)
GFR calc non Af Amer: >60 mL/min (ref >60)
Glucose, Bld: 93 mg/dL (ref 65–99)
Potassium: 4.3 mmol/L (ref 3.5–5.1)
Sodium: 142 mmol/L (ref 135–145)
Total Bilirubin: 0.3 mg/dL (ref 0.3–1.2)
Total Protein: 7.4 g/dL (ref 6.5–8.1)

## 2016-11-04 LAB — URINALYSIS, ROUTINE W REFLEX MICROSCOPIC
Bilirubin Urine: NEGATIVE
GLUCOSE, UA: NEGATIVE mg/dL
Hgb urine dipstick: NEGATIVE
Ketones, ur: NEGATIVE mg/dL
LEUKOCYTES UA: NEGATIVE
Nitrite: NEGATIVE
PH: 6 (ref 5.0–8.0)
Protein, ur: NEGATIVE mg/dL
Specific Gravity, Urine: 1.015 (ref 1.005–1.030)

## 2016-11-04 LAB — HIV ANTIBODY (ROUTINE TESTING W REFLEX): HIV Screen 4th Generation wRfx: NONREACTIVE

## 2016-11-04 LAB — RAPID URINE DRUG SCREEN, HOSP PERFORMED
AMPHETAMINES: POSITIVE — AB
BARBITURATES: NOT DETECTED
BENZODIAZEPINES: NOT DETECTED
Cocaine: NOT DETECTED
Opiates: NOT DETECTED
Tetrahydrocannabinol: NOT DETECTED

## 2016-11-04 MED ORDER — LURASIDONE HCL 20 MG PO TABS
60.0000 mg | ORAL_TABLET | Freq: Every day | ORAL | Status: DC
  Administered 2016-11-04 – 2016-11-06 (×3): 60 mg via ORAL
  Filled 2016-11-04 (×3): qty 3

## 2016-11-04 NOTE — Progress Notes (Signed)
Patient currently unable to provide urine sample or sputum sample. Has not been coughing anything up overnight. Urine collection hat and cup placed in bathroom for when patient able to void, as well as cup placed at bedside for sputum when able. Will continue to monitor.
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
## 2016-11-05 LAB — COMPREHENSIVE METABOLIC PANEL
ALK PHOS: 47 U/L (ref 38–126)
ALT: 11 U/L — ABNORMAL LOW (ref 14–54)
ANION GAP: 6 (ref 5–15)
AST: 17 U/L (ref 15–41)
Albumin: 3.7 g/dL (ref 3.5–5.0)
BUN: 15 mg/dL (ref 6–20)
CALCIUM: 9 mg/dL (ref 8.9–10.3)
CO2: 30 mmol/L (ref 22–32)
CREATININE: 0.67 mg/dL (ref 0.44–1.00)
Chloride: 102 mmol/L (ref 101–111)
Glucose, Bld: 90 mg/dL (ref 65–99)
Potassium: 4.3 mmol/L (ref 3.5–5.1)
Sodium: 138 mmol/L (ref 135–145)
TOTAL PROTEIN: 7 g/dL (ref 6.5–8.1)
Total Bilirubin: 0.4 mg/dL (ref 0.3–1.2)

## 2016-11-05 LAB — CBC
HCT: 29.1 % — ABNORMAL LOW (ref 36.0–46.0)
Hemoglobin: 8.9 g/dL — ABNORMAL LOW (ref 12.0–15.0)
MCH: 22.9 pg — AB (ref 26.0–34.0)
MCHC: 30.6 g/dL (ref 30.0–36.0)
MCV: 74.8 fL — ABNORMAL LOW (ref 78.0–100.0)
PLATELETS: 199 10*3/uL (ref 150–400)
RBC: 3.89 MIL/uL (ref 3.87–5.11)
RDW: 19.2 % — ABNORMAL HIGH (ref 11.5–15.5)
WBC: 3.4 10*3/uL — AB (ref 4.0–10.5)
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
## 2016-11-06 DIAGNOSIS — J984 Other disorders of lung: Secondary | ICD-10-CM

## 2016-11-06 LAB — ACID FAST SMEAR (AFB, MYCOBACTERIA): Acid Fast Smear: NEGATIVE

## 2016-11-06 LAB — ACID FAST SMEAR (AFB)
ACID FAST SMEAR - AFSCU2: NEGATIVE
ACID FAST SMEAR - AFSCU2: NEGATIVE

## 2016-11-06 MED ORDER — ENSURE ENLIVE PO LIQD
237.0000 mL | Freq: Three times a day (TID) | ORAL | Status: DC
  Administered 2016-11-06 – 2016-11-07 (×4): 237 mL via ORAL
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
## 2016-11-07 LAB — CBC
HCT: 31.5 % — ABNORMAL LOW (ref 36.0–46.0)
HEMOGLOBIN: 9.5 g/dL — AB (ref 12.0–15.0)
MCH: 22.5 pg — AB (ref 26.0–34.0)
MCHC: 30.2 g/dL (ref 30.0–36.0)
MCV: 74.5 fL — AB (ref 78.0–100.0)
PLATELETS: 201 10*3/uL (ref 150–400)
RBC: 4.23 MIL/uL (ref 3.87–5.11)
RDW: 19 % — ABNORMAL HIGH (ref 11.5–15.5)
WBC: 3.6 10*3/uL — ABNORMAL LOW (ref 4.0–10.5)

## 2016-11-07 LAB — BASIC METABOLIC PANEL
Anion gap: 8 (ref 5–15)
BUN: 21 mg/dL — AB (ref 6–20)
CHLORIDE: 99 mmol/L — AB (ref 101–111)
CO2: 32 mmol/L (ref 22–32)
CREATININE: 0.84 mg/dL (ref 0.44–1.00)
Calcium: 9.1 mg/dL (ref 8.9–10.3)
GFR calc Af Amer: >60 mL/min (ref >60)
GFR calc non Af Amer: >60 mL/min (ref >60)
GLUCOSE: 94 mg/dL (ref 65–99)
POTASSIUM: 4.6 mmol/L (ref 3.5–5.1)
SODIUM: 139 mmol/L (ref 135–145)

## 2016-11-07 MED ORDER — LEVOFLOXACIN 500 MG PO TABS: 500.0000 mg | ORAL_TABLET | Freq: Every day | ORAL | 0 refills | Status: DC

## 2016-11-07 MED ORDER — CLONAZEPAM 0.5 MG PO TABS: 0.2500 mg | ORAL_TABLET | Freq: Three times a day (TID) | ORAL | 0 refills | Status: DC

## 2016-11-07 MED ORDER — GUAIFENESIN ER 600 MG PO TB12: 1200.0000 mg | ORAL_TABLET | Freq: Two times a day (BID) | ORAL | 0 refills | Status: DC

## 2016-11-07 MED ORDER — RIVAROXABAN 20 MG PO TABS: 20.0000 mg | ORAL_TABLET | Freq: Every day | ORAL | 0 refills | Status: DC

## 2016-11-07 MED ORDER — PROAIR HFA 108 (90 BASE) MCG/ACT IN AERS: 2.0000 | INHALATION_SPRAY | Freq: Four times a day (QID) | RESPIRATORY_TRACT | 3 refills | Status: DC | PRN
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
## 2016-11-07 NOTE — Progress Notes (Signed)
Pt have Medicaid and medication are 0- $6.00.
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
## 2016-11-08 LAB — CULTURE, BLOOD (ROUTINE X 2)
CULTURE: NO GROWTH
CULTURE: NO GROWTH
Special Requests: ADEQUATE
Special Requests: ADEQUATE

## 2016-11-08 LAB — QUANTIFERON TB GOLD ASSAY (BLOOD)

## 2016-11-08 LAB — QUANTIFERON IN TUBE
QFT TB AG MINUS NIL VALUE: 0.01 [IU]/mL
QUANTIFERON MITOGEN VALUE: 7.25 [IU]/mL
QUANTIFERON TB AG VALUE: 0.09 IU/mL
QUANTIFERON TB GOLD: NEGATIVE
Quantiferon Nil Value: 0.08 IU/mL

## 2016-11-13 DIAGNOSIS — R52 Pain, unspecified: Secondary | ICD-10-CM

## 2016-12-19 LAB — ACID FAST CULTURE WITH REFLEXED SENSITIVITIES (MYCOBACTERIA): Acid Fast Culture: NEGATIVE

## 2016-12-19 LAB — ACID FAST CULTURE WITH REFLEXED SENSITIVITIES
ACID FAST CULTURE - AFSCU3: NEGATIVE
ACID FAST CULTURE - AFSCU3: NEGATIVE

## 2017-01-27 ENCOUNTER — Encounter (HOSPITAL_COMMUNITY): Payer: Self-pay | Admitting: Emergency Medicine

## 2017-01-27 ENCOUNTER — Emergency Department (HOSPITAL_COMMUNITY)
Admission: EM | Admit: 2017-01-27 | Discharge: 2017-01-28 | Disposition: A | Discharge status: Home / Self Care | Payer: Medicaid Other | Attending: Emergency Medicine | Admitting: Emergency Medicine

## 2017-01-27 DIAGNOSIS — F419 Anxiety disorder, unspecified: Secondary | ICD-10-CM

## 2017-01-27 DIAGNOSIS — R51 Headache: Secondary | ICD-10-CM | POA: Diagnosis present

## 2017-01-27 DIAGNOSIS — F17210 Nicotine dependence, cigarettes, uncomplicated: Secondary | ICD-10-CM | POA: Insufficient documentation

## 2017-01-27 DIAGNOSIS — Z79899 Other long term (current) drug therapy: Secondary | ICD-10-CM | POA: Insufficient documentation

## 2017-01-27 DIAGNOSIS — G4489 Other headache syndrome: Secondary | ICD-10-CM | POA: Insufficient documentation

## 2017-01-27 NOTE — ED Notes (Signed)
Called pt and no response

## 2017-01-27 NOTE — ED Triage Notes (Signed)
Patient c/o headache since last night as well as anxiety and out of her medications.

## 2017-01-27 NOTE — ED Notes (Signed)
Called pt for vitals and no response 

## 2017-01-28 MED ORDER — SODIUM CHLORIDE 0.9 % IV BOLUS (SEPSIS)
1000.0000 mL | Freq: Once | INTRAVENOUS | Status: AC
  Administered 2017-01-28: 1000 mL via INTRAVENOUS

## 2017-01-28 MED ORDER — ALBUTEROL SULFATE HFA 108 (90 BASE) MCG/ACT IN AERS
2.0000 | INHALATION_SPRAY | Freq: Once | RESPIRATORY_TRACT | Status: AC
  Administered 2017-01-28: 2 via RESPIRATORY_TRACT
  Filled 2017-01-28: qty 6.7

## 2017-01-28 MED ORDER — DIPHENHYDRAMINE HCL 50 MG/ML IJ SOLN
25.0000 mg | Freq: Once | INTRAMUSCULAR | Status: AC
  Administered 2017-01-28: 25 mg via INTRAVENOUS
  Filled 2017-01-28: qty 1

## 2017-01-28 MED ORDER — LORAZEPAM 1 MG PO TABS
1.0000 mg | ORAL_TABLET | Freq: Once | ORAL | Status: AC
  Administered 2017-01-28: 1 mg via ORAL
  Filled 2017-01-28: qty 1

## 2017-01-28 MED ORDER — IPRATROPIUM-ALBUTEROL 0.5-2.5 (3) MG/3ML IN SOLN
3.0000 mL | Freq: Once | RESPIRATORY_TRACT | Status: AC
  Administered 2017-01-28: 3 mL via RESPIRATORY_TRACT
  Filled 2017-01-28: qty 3

## 2017-01-28 MED ORDER — METOCLOPRAMIDE HCL 5 MG/ML IJ SOLN
10.0000 mg | Freq: Once | INTRAMUSCULAR | Status: AC
  Administered 2017-01-28: 10 mg via INTRAVENOUS
  Filled 2017-01-28: qty 2

## 2017-01-28 NOTE — ED Notes (Signed)
Place PT on 2L due to low 02 while sleeping

## 2017-01-28 NOTE — Discharge Instructions (Signed)
Substance Abuse Treatment Programs ° °Intensive Outpatient Programs °[facility]     °[address]      °[place], [place]                   °[phone]      ° °[facility] °[address] °[place], [place] °[phone] ° °[facility]     °(Inpatient and outpatient)     °[address]           °[phone]   ° °[facility] °[phone] (Suboxone and Methadone) ° °[address]      °[place], [place] [address]      °[phone]      ° °[address] °[place], [place] °852-3033 ° °[facility] (Outpatient/Inpatient, [facility])    °(insurance only) [phone]      °       °[facility] (Groups & Residential) °[place], [place] °[phone] ° °   °[facility]     °[address]     °[place], [place]      °[phone]      ° °[facility] (for caregivers and family) °[address] °[place], [place] °336-299-4655 ° ° ° ° ° °Residential Treatment Programs °[facility]      °[address], [place], [place] [address]  °[phone]      ° °[facility] °[address] [place], [place] [address] °[phone] ° °[facility]        °[phone]      ° °[facility] °[phone] ° °[facility]             °[address]                                         °[place], [place]                                                °[phone] or 336-784-9470                              ° °[facility] [place] °[address] °[place], [address] °1.877.941.8954 ° °[facility]    °[address]      °[place], [place]     °[phone]      ° °[facility] °[address] °[place], [place] °[phone] ° °[facility]   °[address]     °[place], [place] [address]     °[phone]      °Admissions: 8am-3pm M-F ° °[facility] °[address] °[place],  [place] °336-227-7417 ° °[facility]: Residential Program (90 Days)   °[place], [place]      °336-725-8389 or 800-758-6077    ° °[facility] °[place], [place] °(Walk in Hours over the weekend or by referral) ° °[facility] °[address], [place], [place] [address] °([phone] ° °Crisis Mobile: [facility]:  1-877-626-1772 (for crisis response 24 hours a day) °[facility] Hotline:      1-800-256-2452 °Outpatient Psychiatry and Counseling ° °[facility]: Mobile Crisis   Management 24 hours:  1-877-626-1772 ° °[facility] sliding scale fee and walk in schedule: M-F 8am-12pm/1pm-3pm °[address]  °[place], [place] [address] °[phone] ° °[facility] °[address] °[place], [place] [address] °[phone] ° °[facility] ([facility])- new patient walk-in appointments available Monday - Friday 8am -3pm.          °[address] °[place], [place] [address] °336-676-6840 or crisis line- 336-676-6905 ° °[facility]/ Intensive Outpatient Therapy Program °[address] °[place], [place] [address] °336-832-9804 ° °[facility]                  °Crisis Services      °[phone]      °[address]     °[place], [place] [address]                ° °[facility]   °[facility] °[phone] °[address] °[place], [place] [address] ° ° °[facility]          °[address],  °[place], [place] [address]       °([phone] ° °[facility] °[address] °[place], [place] [address] °336-292-1510 ° °[facility]    °[address]    °[place], [place] [address]     °[phone]      ° °Parish McKinney, MD     °[address]     °[place] [address]     °336-282-1251     °  °[facility] °[address] °[place] [address] ° °[facility]     °[address]     °[place], [place]      °336-542-2076      ° °[facility] °Shamsher Ahluwalia, MD °[address] °[place], [place] [address] °[phone] ° °[facility]     °[address]     °[place], [place] [address]     °336-274-1237      ° °[facility] °[address] °[place], [place] [address] °336-854-4450 °Resources for Temporary Residential Assistance/Crisis Centers ° °DAY CENTERS °[facility] °M-F 8am-3pm   °[address] [place], [place] [address]   336-332-0824 °Services include: laundry, barbering, support groups, case management, phone  & computer access, showers, AA/NA mtgs, mental health/substance abuse nurse, job skills class, disability information, VA assistance, spiritual classes, etc.  ° °HOMELESS SHELTERS ° °[facility]     °[facility]   °[address], [place]     °[phone]       °       °[facility] (women and children)       °[address] °, [place] [address] °336-275-0820 °[email or web address] for application and process °Application Required ° °[facility]   °[address]    °[place] [address]     °[phone]       °             °[facility] °[address] °, [place] [address] °336.273.5572 °336-235-0363(schedule application appt.) °Application Required ° °[facility] (women only)    °[address]     °[place],  [address]     °336-884-1039      °  Intake starts 6pm daily °Need valid ID, SSC, & Police report °[facility] [place] °[address] °[place], [place] °336-881-5420 °Application Required ° °[facility] (men only)     °[address]      °[place], [place]     °[phone]      ° °[facility] °(Pregnant women only) °[address] °[place], [place] °336-275-0206 ° °[facility]      °[address]      °[place], [place] [address]     °[phone]      °       °[facility] °[address] °[place], [place] °336-723-1848 °90 day commitment/SA/Application process ° °[facility](men only)     °[address]     °[place], [place]     °[phone]       °Check-in at 7pm     °       °[facility] °[address] °[place], [place] [address] °336-248-6684 °Men/Women/Women and Children must be there by 7 pm ° °[facility] °[place], [place] °336-722-8721                ° °

## 2017-01-28 NOTE — ED Provider Notes (Signed)
[facility]-EMERGENCY DEPT Provider Note   CSN: [id number] Arrival date & time: 01/27/17  1409     History   Chief Complaint Chief Complaint  Patient presents with  . Headache  . Anxiety    HPI Julie Sanford is a 36 y.o. female.  The history is provided by the patient.  Headache   This is a new problem. The current episode started 12 to 24 hours ago. The problem occurs constantly. The problem has been gradually worsening. The pain is located in the frontal region. The pain is severe. The pain does not radiate. Associated symptoms include a fever, nausea and vomiting. She has tried nothing for the symptoms.  Anxiety  Associated symptoms include headaches.   Patient presents for 2 acute issues: She reports onset of headache over 12-24 hours ago, this been gradually worsening Headaches before, but this is lasting longer and seems worse Reported she had a fever, but none at this time Reports associated nausea and vomiting There is no focal weakness, but she reports she "hurts all over "  Her other main issues that she is out of her Klonopin and Suboxone Reports he has had issues getting these refilled in clinic Had these medicines in over 24 hours, and she feels that she is withdrawing Past Medical History:  Diagnosis Date  . Bipolar 1 disorder (HCC)   . Depression   . Herpes   . Seizures [facility])     Patient Active Problem List   Diagnosis Date Noted  . Pain   . Cough 11/03/2016  . Cavitary lesion of lung 11/03/2016  . Hypokalemia 11/03/2016  . Prepatellar bursitis, right knee 09/27/2016  . Bipolar 1 disorder (HCC) 09/18/2016  . Protein-calorie malnutrition, severe 09/05/2016  . Pressure injury of skin 09/04/2016  . PTSD (post-traumatic stress disorder) 04/25/2011    Class: Chronic  . 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 Anemia 04/23/2011  . Cocaine abuse (HCC) 04/23/2011    Past Surgical History:  Procedure Laterality Date  . CESAREAN  SECTION      OB History    No data available       Home Medications    Prior to Admission medications   Medication Sig Start Date End Date Taking? Authorizing Provider  baclofen (LIORESAL) 10 MG tablet Take 10 mg by mouth 3 (three) times daily. 07/31/16   [provider]  BREO ELLIPTA 200-25 MCG/INH AEPB Inhale 1 puff into the lungs daily. 10/15/16   [provider]  clonazePAM (KLONOPIN) 0.5 MG tablet Take 0.5 tablets (0.25 mg total) by mouth every 8 (eight) hours. 11/07/16   Regalado, Belkys A, MD  disulfiram (ANTABUSE) 250 MG tablet Take 125 mg by mouth daily. 10/15/16   [provider]  fluticasone (FLONASE) 50 MCG/ACT nasal spray Place 1 spray into both nostrils 2 (two) times daily. 10/21/16   [provider]  gabapentin (NEURONTIN) 300 MG capsule Take 600 mg by mouth 4 (four) times daily - after meals and at bedtime. 08/15/16   [provider]  guaiFENesin (MUCINEX) 600 MG 12 hr tablet Take 2 tablets (1,200 mg total) by mouth 2 (two) times daily. 11/07/16   Regalado, Belkys A, MD  hydrOXYzine (VISTARIL) 50 MG capsule Take 100-200 mg by mouth 3 (three) times daily as needed for anxiety or itching.  09/28/16   [provider]  ipratropium-albuterol (DUONEB) 0.5-2.5 (3) MG/3ML SOLN Take 3 mLs by nebulization every 6 (six) hours as needed (sob, wheezing).  08/08/16  [provider]  levETIRAcetam (KEPPRA) 500 MG tablet Take 1,000 mg by mouth 2 (two) times daily. 08/16/16   [provider]  levofloxacin (LEVAQUIN) 500 MG tablet Take 1 tablet (500 mg total) by mouth daily. 11/08/16   Regalado, Belkys A, MD  Lurasidone HCl (LATUDA) 60 MG TABS Take 60 mg by mouth daily.    [provider]  methocarbamol (ROBAXIN) 750 MG tablet Take 1,500 mg by mouth 4 (four) times daily as needed for muscle spasms.  08/10/16   [provider]  nicotine (NICODERM CQ - DOSED IN MG/24 HOURS) 21 mg/24hr patch Place 1 patch (21 mg total)  onto the skin daily. Patient not taking: Reported on 11/03/2016 09/28/16   Noralee Stainhoi, Jennifer, DO  PROAIR HFA 108 [phone](90 Base) MCG/ACT inhaler Inhale 2 puffs into the lungs every 6 (six) hours as needed for wheezing or shortness of breath. 11/07/16   Regalado, Belkys A, MD  rivaroxaban (XARELTO) 20 MG TABS tablet Take 1 tablet (20 mg total) by mouth daily with supper. 11/07/16   Regalado, Belkys A, MD  sertraline (ZOLOFT) 100 MG tablet Take 100 mg by mouth daily. 07/31/16   [provider]  SUBOXONE 8-2 MG FILM Place 1 Film under the tongue 3 (three) times daily. 08/21/16   [provider]  traZODone (DESYREL) 100 MG tablet Take 400 mg by mouth at bedtime. 08/13/16   [provider]    Family History Family History  Problem Relation Age of Onset  . Diabetes Sister     Social History Social History   Tobacco Use  . Smoking status: Current Every Day Smoker    Packs/day: 2.00    Types: Cigarettes  . Smokeless tobacco: Never Used  Substance Use Topics  . Alcohol use: No  . Drug use: No    Comment: denies     Allergies   Quetiapine; Tramadol; and Penicillins   Review of Systems Review of Systems  Constitutional: Positive for fatigue and fever.  Respiratory:       Chronic short of breath  Gastrointestinal: Positive for nausea and vomiting.  Musculoskeletal: Positive for myalgias.  Neurological: Positive for headaches.  All other systems reviewed and are negative.    Physical Exam Updated Vital Signs BP 125/85 (BP Location: Left Arm)   Pulse 97   Temp (!) 97.4 F (36.3 C) (Oral)   Resp 16   LMP  (LMP Unknown)   SpO2 92%   Physical Exam CONSTITUTIONAL: Chronically ill-appearing, appears older than stated age, appears anxious.  Ice pack on head when I walked in the room HEAD: Normocephalic/atraumatic no visible signs of trauma EYES: EOMI/PERRL ENMT: Mucous membranes moist, poor dentition NECK: supple no meningeal signs SPINE/BACK:entire spine  nontender CV: S1/S2 noted, no murmurs/rubs/gallops noted LUNGS: Lungs are clear to auscultation bilaterally, no apparent distress ABDOMEN: soft, nontender GU:no cva tenderness NEURO: Pt is awake/alert/appropriate, moves all extremitiesx4.  No facial droop.  No arm or leg drift, no past pointing.  No sensory deficit noted, she can ambulate without assistance EXTREMITIES: pulses normal/equal, full ROM SKIN: warm, color normal PSYCH: Anxious ED Treatments / Results  Labs (all labs ordered are listed, but only abnormal results are displayed) Labs Reviewed - No data to display  EKG  EKG Interpretation None       Radiology No results found.  Procedures Procedures (including critical care time)  Medications Ordered in ED Medications  ipratropium-albuterol (DUONEB) 0.5-2.5 (3) MG/3ML nebulizer solution 3 mL (not administered)  albuterol (PROVENTIL HFA;VENTOLIN  HFA) 108 (90 Base) MCG/ACT inhaler 2 puff (not administered)  metoCLOPramide (REGLAN) injection 10 mg (10 mg Intravenous Given 01/28/17 0342)  diphenhydrAMINE (BENADRYL) injection 25 mg (25 mg Intravenous Given 01/28/17 0342)  LORazepam (ATIVAN) tablet 1 mg (1 mg Oral Given 01/28/17 0341)  sodium chloride 0.9 % bolus 1,000 mL (1,000 mLs Intravenous New Bag/Given 01/28/17 0342)     Initial Impression / Assessment and Plan / ED Course  I have reviewed the triage vital signs and the nursing notes. Narcotic database reviewed and considered in decision making     Patient in the ED for headache as well as running out of her medications, and feeling anxious and withdrawing She is anxious however I do not see any signs of significant withdrawal at this time We will treat her headache with migraine cocktail, and will give her a dose of Ativan due to her anxiety She reported fever however she has had none  here, and she does not appear to have meningitis She has no signs of acute neurologic emergency She is very concerned about  her prescription medications, but I informed her that I am unable to refill Suboxone and Klonopin at this time  She reports she is not taking Xarelto anymore she was taken off of it  6:14 AM Patient awake and alert at this time, ambulatory She did have some hypoxia while sleeping, this is after given Ativan as well as Benadryl and Reglan for her headache She reports she is out of her inhalers at home She has underlying COPD as well considering she is a smoker She does not report any new respiratory complaints In the ED we will give her some nebulized treatments and then likely discharge home  It appears her main issues is that the fact that she is out of her medications Appears her headache is improved, she is amatory, no focal neuro deficit Advised her that she should follow-up closely today to get all of her prescriptions refilled Final Clinical Impressions(s) / ED Diagnoses   Final diagnoses:  Anxiety  Other headache syndrome    ED Discharge Orders    None       Zadie RhineWickline, Haskel Dewalt, MD 01/28/17 [phone]

## 2017-02-21 DIAGNOSIS — R45851 Suicidal ideations: Secondary | ICD-10-CM | POA: Diagnosis not present

## 2017-02-21 DIAGNOSIS — F1910 Other psychoactive substance abuse, uncomplicated: Secondary | ICD-10-CM

## 2017-02-21 DIAGNOSIS — J9600 Acute respiratory failure, unspecified whether with hypoxia or hypercapnia: Secondary | ICD-10-CM

## 2017-02-21 DIAGNOSIS — T401X1A Poisoning by heroin, accidental (unintentional), initial encounter: Secondary | ICD-10-CM

## 2017-02-21 DIAGNOSIS — J189 Pneumonia, unspecified organism: Secondary | ICD-10-CM

## 2017-02-22 DIAGNOSIS — J9600 Acute respiratory failure, unspecified whether with hypoxia or hypercapnia: Secondary | ICD-10-CM | POA: Diagnosis not present

## 2017-02-22 DIAGNOSIS — J189 Pneumonia, unspecified organism: Secondary | ICD-10-CM | POA: Diagnosis not present

## 2017-02-22 DIAGNOSIS — F1910 Other psychoactive substance abuse, uncomplicated: Secondary | ICD-10-CM | POA: Diagnosis not present

## 2017-02-22 DIAGNOSIS — T401X1A Poisoning by heroin, accidental (unintentional), initial encounter: Secondary | ICD-10-CM | POA: Diagnosis not present

## 2017-02-22 DIAGNOSIS — R45851 Suicidal ideations: Secondary | ICD-10-CM | POA: Diagnosis not present

## 2017-02-23 DIAGNOSIS — J9600 Acute respiratory failure, unspecified whether with hypoxia or hypercapnia: Secondary | ICD-10-CM | POA: Diagnosis not present

## 2017-02-23 DIAGNOSIS — T401X1A Poisoning by heroin, accidental (unintentional), initial encounter: Secondary | ICD-10-CM | POA: Diagnosis not present

## 2017-02-23 DIAGNOSIS — F1910 Other psychoactive substance abuse, uncomplicated: Secondary | ICD-10-CM | POA: Diagnosis not present

## 2017-02-23 DIAGNOSIS — J189 Pneumonia, unspecified organism: Secondary | ICD-10-CM | POA: Diagnosis not present

## 2017-03-31 ENCOUNTER — Emergency Department (HOSPITAL_COMMUNITY)
Admission: EM | Admit: 2017-03-31 | Discharge: 2017-03-31 | Disposition: A | Discharge status: Home / Self Care | Payer: Medicaid Other | Attending: Emergency Medicine | Admitting: Emergency Medicine

## 2017-03-31 ENCOUNTER — Encounter (HOSPITAL_COMMUNITY): Payer: Self-pay | Admitting: Emergency Medicine

## 2017-03-31 ENCOUNTER — Emergency Department (HOSPITAL_COMMUNITY): Admission: EM | Admit: 2017-03-31 | Discharge: 2017-03-31 | Discharge status: Against Medical Advice | Payer: 59

## 2017-03-31 DIAGNOSIS — Z5320 Procedure and treatment not carried out because of patient's decision for unspecified reasons: Secondary | ICD-10-CM | POA: Insufficient documentation

## 2017-03-31 DIAGNOSIS — F17210 Nicotine dependence, cigarettes, uncomplicated: Secondary | ICD-10-CM | POA: Insufficient documentation

## 2017-03-31 DIAGNOSIS — Y999 Unspecified external cause status: Secondary | ICD-10-CM | POA: Insufficient documentation

## 2017-03-31 DIAGNOSIS — Y939 Activity, unspecified: Secondary | ICD-10-CM | POA: Diagnosis not present

## 2017-03-31 DIAGNOSIS — Z79899 Other long term (current) drug therapy: Secondary | ICD-10-CM | POA: Insufficient documentation

## 2017-03-31 DIAGNOSIS — W1830XA Fall on same level, unspecified, initial encounter: Secondary | ICD-10-CM | POA: Diagnosis not present

## 2017-03-31 DIAGNOSIS — M25531 Pain in right wrist: Secondary | ICD-10-CM | POA: Insufficient documentation

## 2017-03-31 DIAGNOSIS — Y929 Unspecified place or not applicable: Secondary | ICD-10-CM | POA: Insufficient documentation

## 2017-03-31 DIAGNOSIS — S6991XA Unspecified injury of right wrist, hand and finger(s), initial encounter: Secondary | ICD-10-CM | POA: Diagnosis present

## 2017-03-31 NOTE — ED Provider Notes (Signed)
[facility]-EMER[facility] DEPT Provider Note   CSN: [id number] Arrival date & time: 03/31/17  1405     History   Chief Complaint Chief Complaint  Patient presents with  . Detox  . Wrist Pain    HPI Julie Sanford is a 37 y.o. female.  37 year old female with history of opiate addiction presents with 2 complaints.  The first is that she injured her right wrist several days ago after a fall.  Has had pain at the dorsal surface of her right wrist that is worse with any movement.  States that she has had decreased grip strength.  Denies any numbness or tingling.  Has a secondary complaint of requesting detox from opiates as well as Xanax.  Denies any suicidal or homicidal ideations.  Denies any vomiting.  Last use was yesterday.  No treatment used prior to arrival      Past Medical History:  Diagnosis Date  . Bipolar 1 disorder (HCC)   . Depression   . Herpes   . Seizures [facility])     Patient Active Problem List   Diagnosis Date Noted  . Pain   . Cough 11/03/2016  . Cavitary lesion of lung 11/03/2016  . Hypokalemia 11/03/2016  . Prepatellar bursitis, right knee 09/27/2016  . Bipolar 1 disorder (HCC) 09/18/2016  . Protein-calorie malnutrition, severe 09/05/2016  . Pressure injury of skin 09/04/2016  . PTSD (post-traumatic stress disorder) 04/25/2011    Class: Chronic  . 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 Anemia 04/23/2011  . Cocaine abuse (HCC) 04/23/2011    Past Surgical History:  Procedure Laterality Date  . CESAREAN SECTION      OB History    No data available       Home Medications    Prior to Admission medications   Medication Sig Start Date End Date Taking? Authorizing Provider  baclofen (LIORESAL) 10 MG tablet Take 10 mg by mouth 3 (three) times daily. 07/31/16  Yes [provider]  BREO ELLIPTA 200-25 MCG/INH AEPB Inhale 1 puff into the lungs daily. 10/15/16  Yes [provider]  clonazePAM (KLONOPIN) 0.5 MG tablet  Take 0.5 tablets (0.25 mg total) by mouth every 8 (eight) hours. 11/07/16  Yes Regalado, Belkys A, MD  disulfiram (ANTABUSE) 250 MG tablet Take 125 mg by mouth daily. 10/15/16  Yes [provider]  gabapentin (NEURONTIN) 300 MG capsule Take 600 mg by mouth 4 (four) times daily - after meals and at bedtime. 08/15/16  Yes [provider]  hydrOXYzine (VISTARIL) 50 MG capsule Take 100-200 mg by mouth 3 (three) times daily as needed for anxiety or itching.  09/28/16  Yes [provider]  levETIRAcetam (KEPPRA) 500 MG tablet Take 1,000 mg by mouth 2 (two) times daily. 08/16/16  Yes [provider]  Lurasidone HCl (LATUDA) 60 MG TABS Take 60 mg by mouth at bedtime.    Yes [provider]  PROAIR HFA 108 (90 Base) MCG/ACT inhaler Inhale 2 puffs into the lungs every 6 (six) hours as needed for wheezing or shortness of breath. 11/07/16  Yes Regalado, Belkys A, MD  rivaroxaban (XARELTO) 20 MG TABS tablet Take 1 tablet (20 mg total) by mouth daily with supper. 11/07/16  Yes Regalado, Belkys A, MD  sertraline (ZOLOFT) 100 MG tablet Take 100 mg by mouth daily. 07/31/16  Yes [provider]  SUBOXONE 8-2 MG FILM Place 1 Film under the tongue 3 (three) times daily. 08/21/16  Yes [provider]  traZODone (  DESYREL) 100 MG tablet Take 400 mg by mouth at bedtime. 08/13/16  Yes [provider]  guaiFENesin (MUCINEX) 600 MG 12 hr tablet Take 2 tablets (1,200 mg total) by mouth 2 (two) times daily. Patient not taking: Reported on 03/31/2017 11/07/16   Regalado, Jon BillingsBelkys A, MD  ipratropium-albuterol (DUONEB) 0.5-2.5 (3) MG/3ML SOLN Take 3 mLs by nebulization every 6 (six) hours as needed (sob, wheezing).  08/08/16   [provider]  levofloxacin (LEVAQUIN) 500 MG tablet Take 1 tablet (500 mg total) by mouth daily. Patient not taking: Reported on 03/31/2017 11/08/16   Regalado, Jon BillingsBelkys A, MD  methocarbamol (ROBAXIN) 750 MG tablet Take 1,500 mg by mouth 4 (four)  times daily as needed for muscle spasms.  08/10/16   [provider]  nicotine (NICODERM CQ - DOSED IN MG/24 HOURS) 21 mg/24hr patch Place 1 patch (21 mg total) onto the skin daily. Patient not taking: Reported on 11/03/2016 09/28/16   Noralee Stainhoi, Jennifer, DO    Family History Family History  Problem Relation Age of Onset  . Diabetes Sister     Social History Social History   Tobacco Use  . Smoking status: Current Every Day Smoker    Packs/day: 2.00    Types: Cigarettes  . Smokeless tobacco: Never Used  Substance Use Topics  . Alcohol use: No  . Drug use: No    Comment: denies     Allergies   Quetiapine; Tramadol; and Penicillins   Review of Systems Review of Systems  All other systems reviewed and are negative.    Physical Exam Updated Vital Signs BP (!) 133/92 (BP Location: Left Arm)   Pulse 88   Temp 98.4 F (36.9 C) (Oral)   Resp 17   SpO2 95%   Physical Exam  Constitutional: She is oriented to person, place, and time. She appears cachectic.  HENT:  Head: Normocephalic and atraumatic.  Eyes: Conjunctivae, EOM and lids are normal. Pupils are equal, round, and reactive to light.  Neck: Normal range of motion. Neck supple. No tracheal deviation present. No thyroid mass present.  Cardiovascular: Normal rate, regular rhythm and normal heart sounds. Exam reveals no gallop.  No murmur heard. Pulmonary/Chest: Effort normal and breath sounds normal. No stridor. No respiratory distress. She has no decreased breath sounds. She has no wheezes. She has no rhonchi. She has no rales.  Abdominal: Soft. Normal appearance and bowel sounds are normal. She exhibits no distension. There is no tenderness. There is no rebound and no CVA tenderness.  Musculoskeletal: She exhibits no edema or tenderness.       Right wrist: She exhibits decreased range of motion, bony tenderness and swelling.  Neurological: She is alert and oriented to person, place, and time. She has normal  strength. No cranial nerve deficit or sensory deficit. GCS eye subscore is 4. GCS verbal subscore is 5. GCS motor subscore is 6.  Skin: Skin is warm and dry. No abrasion and no rash noted.  Psychiatric: Her behavior is normal. Her mood appears anxious. Her speech is rapid and/or pressured. She is not actively hallucinating. She expresses no suicidal plans and no homicidal plans.  Nursing note and vitals reviewed.    ED Treatments / Results  Labs (all labs ordered are listed, but only abnormal results are displayed) Labs Reviewed - No data to display  EKG  EKG Interpretation None       Radiology No results found.  Procedures Procedures (including critical care time)  Medications Ordered in ED  Medications - No data to display   Initial Impression / Assessment and Plan / ED Course  I have reviewed the triage vital signs and the nursing notes.  Pertinent labs & imaging results that were available during my care of the patient were reviewed by me and considered in my medical decision making (see chart for details).     Patient offered an x-ray of her wrist to rule out fracture which she has deferred.  I informed her that we do not do detox from heroin or Xanax.  She does not have any suicidal or homicidal ideations.  States that she does not want away at this time and has eloped.  Final Clinical Impressions(s) / ED Diagnoses   Final diagnoses:  None    ED Discharge Orders    None       Lorre Nick, MD 03/31/17 1714

## 2017-03-31 NOTE — ED Triage Notes (Addendum)
Patient reports approximately four days ago she had a syncopal episode and "woke up five hours later." Reports since that time she has been unable to move her wrist. Equal grips bilaterally. Denies headache, weakness, neck and back pain. Additionally, patient requesting detox from heroin and xanax, last use last night. Wounds noted to right arm.

## 2017-03-31 NOTE — ED Notes (Signed)
Pt states that she is here for "detox" off of heroin and Xanax

## 2017-07-10 ENCOUNTER — Encounter (HOSPITAL_COMMUNITY): Payer: Self-pay | Admitting: *Deleted

## 2017-07-10 ENCOUNTER — Other Ambulatory Visit: Payer: Self-pay

## 2017-07-10 ENCOUNTER — Emergency Department (HOSPITAL_COMMUNITY): Payer: Medicaid Other

## 2017-07-10 ENCOUNTER — Emergency Department (HOSPITAL_COMMUNITY)
Admission: EM | Admit: 2017-07-10 | Discharge: 2017-07-11 | Disposition: A | Discharge status: Home / Self Care | Payer: Medicaid Other | Attending: Emergency Medicine | Admitting: Emergency Medicine

## 2017-07-10 DIAGNOSIS — R0789 Other chest pain: Secondary | ICD-10-CM | POA: Diagnosis not present

## 2017-07-10 DIAGNOSIS — F17210 Nicotine dependence, cigarettes, uncomplicated: Secondary | ICD-10-CM | POA: Diagnosis not present

## 2017-07-10 DIAGNOSIS — R569 Unspecified convulsions: Secondary | ICD-10-CM | POA: Diagnosis not present

## 2017-07-10 DIAGNOSIS — F1910 Other psychoactive substance abuse, uncomplicated: Secondary | ICD-10-CM

## 2017-07-10 DIAGNOSIS — R21 Rash and other nonspecific skin eruption: Secondary | ICD-10-CM | POA: Insufficient documentation

## 2017-07-10 DIAGNOSIS — Z79899 Other long term (current) drug therapy: Secondary | ICD-10-CM | POA: Diagnosis not present

## 2017-07-10 LAB — BASIC METABOLIC PANEL
Anion gap: 8 (ref 5–15)
BUN: 21 mg/dL — AB (ref 6–20)
CALCIUM: 9 mg/dL (ref 8.9–10.3)
CO2: 32 mmol/L (ref 22–32)
CREATININE: 1 mg/dL (ref 0.44–1.00)
Chloride: 100 mmol/L — ABNORMAL LOW (ref 101–111)
GFR calc Af Amer: >60 mL/min (ref >60)
GLUCOSE: 92 mg/dL (ref 65–99)
Potassium: 4.5 mmol/L (ref 3.5–5.1)
SODIUM: 140 mmol/L (ref 135–145)

## 2017-07-10 LAB — URINALYSIS, ROUTINE W REFLEX MICROSCOPIC
BILIRUBIN URINE: NEGATIVE
Bacteria, UA: NONE SEEN
GLUCOSE, UA: NEGATIVE mg/dL
Hgb urine dipstick: NEGATIVE
Ketones, ur: 5 mg/dL — AB
Leukocytes, UA: NEGATIVE
Nitrite: NEGATIVE
PH: 5 (ref 5.0–8.0)
Protein, ur: 30 mg/dL — AB
SPECIFIC GRAVITY, URINE: 1.03 (ref 1.005–1.030)

## 2017-07-10 LAB — CBC WITH DIFFERENTIAL/PLATELET
Basophils Absolute: 0 10*3/uL (ref 0.0–0.1)
Basophils Relative: 0 %
EOS ABS: 0.1 10*3/uL (ref 0.0–0.7)
Eosinophils Relative: 2 %
HEMATOCRIT: 33 % — AB (ref 36.0–46.0)
Hemoglobin: 9.9 g/dL — ABNORMAL LOW (ref 12.0–15.0)
LYMPHS ABS: 1.6 10*3/uL (ref 0.7–4.0)
Lymphocytes Relative: 34 %
MCH: 21.6 pg — ABNORMAL LOW (ref 26.0–34.0)
MCHC: 30 g/dL (ref 30.0–36.0)
MCV: 71.9 fL — ABNORMAL LOW (ref 78.0–100.0)
MONO ABS: 0.3 10*3/uL (ref 0.1–1.0)
MONOS PCT: 6 %
NEUTROS ABS: 2.6 10*3/uL (ref 1.7–7.7)
Neutrophils Relative %: 58 %
PLATELETS: 267 10*3/uL (ref 150–400)
RBC: 4.59 MIL/uL (ref 3.87–5.11)
RDW: 21.8 % — AB (ref 11.5–15.5)
WBC: 4.6 10*3/uL (ref 4.0–10.5)

## 2017-07-10 LAB — I-STAT BETA HCG BLOOD, ED (MC, WL, AP ONLY)

## 2017-07-10 LAB — RAPID URINE DRUG SCREEN, HOSP PERFORMED
Amphetamines: POSITIVE — AB
BARBITURATES: NOT DETECTED
BENZODIAZEPINES: POSITIVE — AB
COCAINE: NOT DETECTED
Opiates: POSITIVE — AB
TETRAHYDROCANNABINOL: NOT DETECTED

## 2017-07-10 LAB — I-STAT TROPONIN, ED: Troponin i, poc: 0 ng/mL (ref 0.00–0.08)

## 2017-07-10 LAB — D-DIMER, QUANTITATIVE: D-Dimer, Quant: 1.01 ug/mL-FEU — ABNORMAL HIGH (ref 0.00–0.50)

## 2017-07-10 MED ORDER — ACETAMINOPHEN 500 MG PO TABS
1000.0000 mg | ORAL_TABLET | Freq: Once | ORAL | Status: AC
  Administered 2017-07-10: 1000 mg via ORAL
  Filled 2017-07-10: qty 2

## 2017-07-10 MED ORDER — SODIUM CHLORIDE 0.9 % IV BOLUS
500.0000 mL | Freq: Once | INTRAVENOUS | Status: AC
  Administered 2017-07-10: 500 mL via INTRAVENOUS

## 2017-07-10 MED ORDER — VALPROATE SODIUM 500 MG/5ML IV SOLN
1000.0000 mg | Freq: Once | INTRAVENOUS | Status: AC
  Administered 2017-07-10: 1000 mg via INTRAVENOUS
  Filled 2017-07-10: qty 10

## 2017-07-10 MED ORDER — DIPHENHYDRAMINE HCL 25 MG PO CAPS
25.0000 mg | ORAL_CAPSULE | Freq: Once | ORAL | Status: AC
  Administered 2017-07-10: 25 mg via ORAL
  Filled 2017-07-10: qty 1

## 2017-07-10 NOTE — Discharge Instructions (Addendum)
Please call one of the primary care office below until you are able to obtain an appointment.  Please return to the emergency department if you develop any new or worsening symptoms.

## 2017-07-10 NOTE — ED Notes (Signed)
Patient complaining of rash. Patient seen picking at her skin, advised patient it will not help her rash.

## 2017-07-10 NOTE — ED Notes (Signed)
Pt reports that she needs a medication for her rash.

## 2017-07-10 NOTE — ED Provider Notes (Signed)
[facility]-EMERGENCY DEPT Provider Note   CSN: [id number] Arrival date & time: 07/10/17  1546     History   Chief Complaint Chief Complaint  Patient presents with  . Seizures  . Rash    HPI Julie Sanford is a 37 y.o. female w/ h/o seizures, polysubstance abuse, DVT, here for evaluation with multiple complaints including chest pain, rash and increased seizures. States she has not taken her depakote and neurontin for at least 4 months. In the last few months she has had at least 1 seizure a week, this has increased in the last two weeks to 3x a week and today she had two seizures back to back. Admits to using heroin, roxy and xanax frequently. Denies ETOH use. No alleviating or aggravating factors. No interventions PTA.   Reports rash to bilateral legs x 2 weeks, thinks it might be poison ivy.  Associated with itching and burning pain all over her legs. Rash is itchy and it makes her pick at her skin in her legs and arm, now has crusted over lesions. She is requesting something to prevent infection and something for pain. She denies these are injections sites from heroin use.   CP described as "stabbing burning" chest pain, central, intermittent, without radiation x 2 days.  Non pleuritic. Non exertional. "random". Denies fevers, cough or SOB. H/o DVT non compliant with xarelto.  HPI  Past Medical History:  Diagnosis Date  . Bipolar 1 disorder (HCC)   . Depression   . Herpes   . Seizures [facility])     Patient Active Problem List   Diagnosis Date Noted  . Pain   . Cough 11/03/2016  . Cavitary lesion of lung 11/03/2016  . Hypokalemia 11/03/2016  . Prepatellar bursitis, right knee 09/27/2016  . Bipolar 1 disorder (HCC) 09/18/2016  . Protein-calorie malnutrition, severe 09/05/2016  . Pressure injury of skin 09/04/2016  . PTSD (post-traumatic stress disorder) 04/25/2011    Class: Chronic  . 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 Anemia 04/23/2011  . Cocaine  abuse (HCC) 04/23/2011    Past Surgical History:  Procedure Laterality Date  . CESAREAN SECTION       OB History   None      Home Medications    Prior to Admission medications   Medication Sig Start Date End Date Taking? Authorizing Provider  baclofen (LIORESAL) 10 MG tablet Take 10 mg by mouth 3 (three) times daily. 07/31/16  Yes [provider]  BREO ELLIPTA 200-25 MCG/INH AEPB Inhale 1 puff into the lungs daily. 10/15/16  Yes [provider]  disulfiram (ANTABUSE) 250 MG tablet Take 125 mg by mouth daily. 10/15/16  Yes [provider]  gabapentin (NEURONTIN) 300 MG capsule Take 600 mg by mouth 4 (four) times daily - after meals and at bedtime. 08/15/16  Yes [provider]  levETIRAcetam (KEPPRA) 500 MG tablet Take 1,000 mg by mouth 2 (two) times daily.   Yes [provider]  PROAIR HFA 108 (90 Base) MCG/ACT inhaler Inhale 2 puffs into the lungs every 6 (six) hours as needed for wheezing or shortness of breath. 11/07/16  Yes Regalado, Belkys A, MD  clonazePAM (KLONOPIN) 0.5 MG tablet Take 0.5 tablets (0.25 mg total) by mouth every 8 (eight) hours. 11/07/16   Regalado, Belkys A, MD  guaiFENesin (MUCINEX) 600 MG 12 hr tablet Take 2 tablets (1,200 mg total) by mouth 2 (two) times daily. Patient not taking: Reported on 03/31/2017 11/07/16   Hartley Barefoot  A, MD  hydrOXYzine (VISTARIL) 50 MG capsule Take 100-200 mg by mouth 3 (three) times daily as needed for anxiety or itching.  09/28/16   [provider]  ipratropium-albuterol (DUONEB) 0.5-2.5 (3) MG/3ML SOLN Take 3 mLs by nebulization every 6 (six) hours as needed (sob, wheezing).  08/08/16   [provider]  levETIRAcetam (KEPPRA) 500 MG tablet Take 1,000 mg by mouth 2 (two) times daily. 08/16/16   [provider]  levofloxacin (LEVAQUIN) 500 MG tablet Take 1 tablet (500 mg total) by mouth daily. Patient not taking: Reported on 03/31/2017 11/08/16   Regalado, Jon Billings A, MD    Lurasidone HCl (LATUDA) 60 MG TABS Take 60 mg by mouth at bedtime.     [provider]  methocarbamol (ROBAXIN) 750 MG tablet Take 1,500 mg by mouth 4 (four) times daily as needed for muscle spasms.  08/10/16   [provider]  nicotine (NICODERM CQ - DOSED IN MG/24 HOURS) 21 mg/24hr patch Place 1 patch (21 mg total) onto the skin daily. Patient not taking: Reported on 11/03/2016 09/28/16   Noralee Stain, DO  rivaroxaban (XARELTO) 20 MG TABS tablet Take 1 tablet (20 mg total) by mouth daily with supper. Patient not taking: Reported on 07/10/2017 11/07/16   Regalado, Jon Billings A, MD  sertraline (ZOLOFT) 100 MG tablet Take 100 mg by mouth daily. 07/31/16   [provider]  SUBOXONE 8-2 MG FILM Place 1 Film under the tongue 3 (three) times daily. 08/21/16   [provider]  traZODone (DESYREL) 100 MG tablet Take 400 mg by mouth at bedtime. 08/13/16   [provider]    Family History Family History  Problem Relation Age of Onset  . Diabetes Sister     Social History Social History   Tobacco Use  . Smoking status: Current Every Day Smoker    Packs/day: 2.00    Types: Cigarettes  . Smokeless tobacco: Never Used  Substance Use Topics  . Alcohol use: No  . Drug use: No    Comment: denies     Allergies   Quetiapine; Tramadol; and Penicillins   Review of Systems Review of Systems  Cardiovascular: Positive for chest pain.  Skin: Positive for rash.  Neurological: Positive for seizures.  All other systems reviewed and are negative.    Physical Exam Updated Vital Signs BP 122/89 (BP Location: Left Arm)   Pulse (!) 109   Temp 97.6 F (36.4 C) (Oral)   Resp 20   Ht  (1.549 m)   Wt 45.4 kg (100 lb)   LMP  (LMP Unknown)   SpO2 90%   BMI 18.89 kg/m   Physical Exam  Constitutional: She is oriented to person, place, and time. She appears well-developed and well-nourished. No distress.  Noted to be rocking on the hall bed.  Nontoxic.   HENT:  Head: Normocephalic and atraumatic.  Nose: Nose normal.  No teeth.  Moist mucous membranes.  Eyes: Pupils are equal, round, and reactive to light. Conjunctivae and EOM are normal.  Neck: Normal range of motion.  Cardiovascular: Normal rate, regular rhythm and intact distal pulses.  2+ DP and radial pulses bilaterally. No LE edema.   Pulmonary/Chest: Effort normal and breath sounds normal.  Abdominal: Soft. Bowel sounds are normal. There is no tenderness.  No G/R/R. No suprapubic or CVA tenderness. Negative Murphy's and McBurney's.   Musculoskeletal: Normal range of motion.  Neurological: She is alert and oriented to person, place, and time.  Alert and  oriented to self, place, time and event.  Speech is fluent without obvious dysarthria or dysphasia. Strength 5/5 with hand grip and ankle F/E.   Sensation to light touch intact in hands and feet. Normal gait. No pronator drift. No leg drop.  Normal finger-to-nose and finger tapping.  CN I and VIII not tested. CN II-XII grossly intact bilaterally.  Knee and brachioradialis DTR symmetric. No ankle clonus.   Skin: Skin is warm and dry. Capillary refill takes less than 2 seconds. Rash noted.  Numerous, scabbed, ulcerated type lesions throughout skin over face, upper extremities, dorsal hands, lower extremities, toes.  She has multiple track marks.  No significant erythema, edema, warmth, drainage from these lesions.  Patient has been noted to be picking at her skin by RN.  Psychiatric: She has a normal mood and affect. Her behavior is normal.  Nursing note and vitals reviewed.    ED Treatments / Results  Labs (all labs ordered are listed, but only abnormal results are displayed) Labs Reviewed  BASIC METABOLIC PANEL - Abnormal; Notable for the following components:      Result Value   Chloride 100 (*)    BUN 21 (*)    All other components within normal limits  CBC WITH DIFFERENTIAL/PLATELET - Abnormal; Notable for the following  components:   Hemoglobin 9.9 (*)    HCT 33.0 (*)    MCV 71.9 (*)    MCH 21.6 (*)    RDW 21.8 (*)    All other components within normal limits  URINALYSIS, ROUTINE W REFLEX MICROSCOPIC - Abnormal; Notable for the following components:   APPearance TURBID (*)    Ketones, ur 5 (*)    Protein, ur 30 (*)    All other components within normal limits  RAPID URINE DRUG SCREEN, HOSP PERFORMED - Abnormal; Notable for the following components:   Opiates POSITIVE (*)    Benzodiazepines POSITIVE (*)    Amphetamines POSITIVE (*)    All other components within normal limits  D-DIMER, QUANTITATIVE (NOT AT [facility]) - Abnormal; Notable for the following components:   D-Dimer, Quant 1.01 (*)    All other components within normal limits  I-STAT BETA HCG BLOOD, ED ([facility], [facility], [facility] ONLY)  I-STAT BETA HCG BLOOD, ED ([facility], [facility], [facility] ONLY)  I-STAT TROPONIN, ED  CBG MONITORING, ED  POC URINE PREG, ED  I-STAT TROPONIN, ED    EKG None  Radiology Dg Chest 2 View  Result Date: 07/10/2017 CLINICAL DATA:  Pt states she has been having seizures. Pt states she has been out of her seizure medication for months. Pt also complains of rash. Pt states she had poison ivy and has been scratching her skin. EXAM: CHEST - 2 VIEW COMPARISON:  02/21/2017 FINDINGS: The heart size and mediastinal contours are within normal limits. Both lungs are clear. No pleural effusion or pneumothorax. The visualized skeletal structures are unremarkable. IMPRESSION: No active cardiopulmonary disease. Electronically Signed   By: Amie Portland M.D.   On: 07/10/2017 20:10    Procedures Procedures (including critical care time)  Medications Ordered in ED Medications  valproate (DEPACON) 1,000 mg in dextrose 5 % 50 mL IVPB (0 mg Intravenous Stopped 07/10/17 2035)  sodium chloride 0.9 % bolus 500 mL (0 mLs Intravenous Stopped 07/10/17 2046)  diphenhydrAMINE (BENADRYL) capsule 25 mg (25 mg Oral Given 07/10/17 2028)  acetaminophen (TYLENOL) tablet 1,000 mg  (1,000 mg Oral Given 07/10/17 2028)     Initial Impression / Assessment and Plan / ED Course  I have reviewed the triage vital signs and the nursing notes.  Pertinent labs & imaging results that were available during my care of the patient were reviewed by me and considered in my medical decision making (see chart for details).  Clinical Course as of Jul 11 2318  Thu Jul 10, 2017  2150 Re-evaluated pt. She is pacing around her hall bed in NAD.  Pending Ua.    [CG]  2224 Hemoglobin(!): 9.9 [CG]  2307 D-Dimer, Quant(!): 1.01 [CG]  2309 Opiates(!): POSITIVE [CG]  2310 Benzodiazepines(!): POSITIVE [CG]  2310 Amphetamines(!): POSITIVE [CG]    Clinical Course User Index [CG] Liberty Handy, PA-C   37 yo here with multiple complaints including CP, rash and increased seizures. Non compliant with xarelto and antiepileptics, in setting of polysubstance abuse. Likely break through seizures from non compliance, she is a difficult historian will rule out occult infection leading to decreased seizure threshold.   On exam rash does not appear to be contact dermatitis, ulceration lesions along superficial veins, highly suspicion of track marks. No obvious superimposed infection, abscess.  She is tachycardic with SpO2 90%.   2215: Hgb 9.9 at baseline. CXR negative. Pt given benadryl for pruritus, APAP, valproate load and IVF in ER. Pending d-dimer, UA.   Final Clinical Impressions(s) / ED Diagnoses  2315: Re-evaluated pt who reports "chest tightness". Discussed plan for CTAP and delta trop, if negative dc with rx for depakote. She is in agreement. Pt will be handed off to oncoming EDPA who will f/u with CTA and delta trop. EKG reviewed by myself and Dr Silverio Lay.  Final diagnoses:  Seizure [facility])  Atypical chest pain  Polysubstance abuse [facility])    ED Discharge Orders    None       Jerrell Mylar 07/10/17 2320    Charlynne Pander, MD 07/10/17 2330

## 2017-07-10 NOTE — ED Notes (Signed)
EDPA Provider at bedside. 

## 2017-07-10 NOTE — ED Triage Notes (Addendum)
Pt states she has been having seizures. Pt states she has been out of her seizure medication for months. Pt also complains of rash. Pt states she had poison ivy and has been scratching her skin.   Pt also states she is suicidal, pt states she does not have plan.   Pt states she is not suicidal at this time, but has thoughts that she doesn't want to be here.

## 2017-07-10 NOTE — ED Notes (Signed)
PT DENIES SI/HI. ADMITS TO HEROIN AND ROXY USE THIS AM. PT C/O OF GENERALIZED PAIN AND "BURNING SENSATION ALL OVER". PT STATES SHE HAS HAD MULTIPLE SEIZURES TODAY AND RECENT. HAS NOT SEEN NEUROLOGY FOR 4 MONTHS. OUT OF MEDICATION. TAKES DEPAKOTE AND GABA. REQUESTING REFILLS

## 2017-07-10 NOTE — ED Notes (Signed)
Patient transported to X-ray 

## 2017-07-11 ENCOUNTER — Emergency Department (HOSPITAL_COMMUNITY): Payer: Medicaid Other

## 2017-07-11 ENCOUNTER — Encounter (HOSPITAL_COMMUNITY): Payer: Self-pay

## 2017-07-11 LAB — I-STAT TROPONIN, ED: Troponin i, poc: 0 ng/mL (ref 0.00–0.08)

## 2017-07-11 LAB — CBG MONITORING, ED: Glucose-Capillary: 99 mg/dL (ref 65–99)

## 2017-07-11 MED ORDER — HYDROXYZINE PAMOATE 50 MG PO CAPS: 100.0000 mg | ORAL_CAPSULE | Freq: Three times a day (TID) | ORAL | 0 refills | Status: DC | PRN

## 2017-07-11 MED ORDER — RIVAROXABAN (XARELTO) VTE STARTER PACK (15 & 20 MG): ORAL_TABLET | ORAL | 0 refills | Status: DC

## 2017-07-11 MED ORDER — IOPAMIDOL (ISOVUE-370) INJECTION 76%
100.0000 mL | Freq: Once | INTRAVENOUS | Status: AC | PRN
  Administered 2017-07-11: 80 mL via INTRAVENOUS

## 2017-07-11 MED ORDER — IOPAMIDOL (ISOVUE-370) INJECTION 76%
INTRAVENOUS | Status: AC
  Filled 2017-07-11: qty 100

## 2017-07-11 MED ORDER — METHOCARBAMOL 750 MG PO TABS: 1500.0000 mg | ORAL_TABLET | Freq: Four times a day (QID) | ORAL | 0 refills | Status: DC | PRN

## 2017-07-11 MED ORDER — LEVETIRACETAM 500 MG PO TABS: 1000.0000 mg | ORAL_TABLET | Freq: Two times a day (BID) | ORAL | 0 refills | Status: DC

## 2017-07-11 NOTE — Care Management Note (Signed)
Case Management Note  CM consulted for no pcp.  Pt provided [facility] and [facility] to follow up with on AVS.  CM spoke with pt to discuss clinics and advised her that CM was unaware of offices that accepted Medicaid in [place].  Pt acknowledged understanding and mentioned she has no refills on any of her medications.  CM updated Law, PA.  No further CM needs noted at this time.

## 2017-07-11 NOTE — ED Provider Notes (Signed)
6:45 AM Patient care assumed from [name].  Patient presenting for multiple complaints.  At change of shift, pending CT angiogram to rule out PE.  Patient with history of DVT and noncompliance on Xarelto.  She reports last taking this medication 4 weeks ago.  CTA today is negative for PE.  Laboratory work-up consistent with baseline.    She has not been followed by a primary care doctor recently.  She attributes this to ongoing drug use.  She has been hemodynamically stable since arrival in the emergency department.  Given history of chronic medical problems and need for primary care follow-up, will have care management assess in the morning.  Patient signed out to Julie BayleyAlex Law, PA-C who will disposition after CM consultation. Will provide Rx to restart Xarelto.   Antony MaduraHumes, Oveda Dadamo, PA-C 07/11/17 [id number]

## 2017-07-11 NOTE — ED Provider Notes (Signed)
Signout from [employer], PA-C at shift change See previous providers note for full H&P  Patient presenting with seizure and rash.  She has not been on her anticoagulation for PE.  She has been out of all of her medicines.  At shift change, patient waiting for case management to arrange PCP follow-up and to help with Xarelto prescription.  Marylene Land with case management told patient she can follow-up with [facility] or patient care center.  Patient has Medicaid and Xarelto will be paid for.  Patient also requesting refills of all her medicines.  Patient is on many controlled substances and psychiatric medications which she has not been taking for months.  Will refill patient's Keppra, Robaxin, and Vistaril, however patient referred to PCP for refill of other medications.  Patient is feeling well and ready to go home.  Return precautions discussed.  Patient understands and agrees with plan.  Patient vitals stable throughout ED course and discharged in satisfactory condition.   Emi Holes, PA-C 07/11/17 1313    Mesner, Barbara Cower, MD 07/11/17 [phone]

## 2018-01-21 ENCOUNTER — Other Ambulatory Visit: Payer: Self-pay

## 2018-01-21 ENCOUNTER — Emergency Department (HOSPITAL_COMMUNITY): Payer: Medicaid Other

## 2018-01-21 ENCOUNTER — Encounter (HOSPITAL_COMMUNITY): Payer: Self-pay

## 2018-01-21 ENCOUNTER — Emergency Department (HOSPITAL_COMMUNITY)
Admission: EM | Admit: 2018-01-21 | Discharge: 2018-01-21 | Disposition: A | Discharge status: Home / Self Care | Payer: Medicaid Other | Attending: Emergency Medicine | Admitting: Emergency Medicine

## 2018-01-21 DIAGNOSIS — F17210 Nicotine dependence, cigarettes, uncomplicated: Secondary | ICD-10-CM | POA: Diagnosis not present

## 2018-01-21 DIAGNOSIS — Z79899 Other long term (current) drug therapy: Secondary | ICD-10-CM | POA: Diagnosis not present

## 2018-01-21 DIAGNOSIS — Z209 Contact with and (suspected) exposure to unspecified communicable disease: Secondary | ICD-10-CM | POA: Diagnosis not present

## 2018-01-21 DIAGNOSIS — R51 Headache: Secondary | ICD-10-CM | POA: Diagnosis present

## 2018-01-21 DIAGNOSIS — B349 Viral infection, unspecified: Secondary | ICD-10-CM | POA: Insufficient documentation

## 2018-01-21 DIAGNOSIS — Z3202 Encounter for pregnancy test, result negative: Secondary | ICD-10-CM | POA: Insufficient documentation

## 2018-01-21 LAB — BASIC METABOLIC PANEL
Anion gap: 7 (ref 5–15)
BUN: 9 mg/dL (ref 6–20)
CHLORIDE: 103 mmol/L (ref 98–111)
CO2: 30 mmol/L (ref 22–32)
CREATININE: 0.75 mg/dL (ref 0.44–1.00)
Calcium: 8.9 mg/dL (ref 8.9–10.3)
GFR calc Af Amer: >60 mL/min (ref >60)
GLUCOSE: 94 mg/dL (ref 70–99)
POTASSIUM: 3.7 mmol/L (ref 3.5–5.1)
Sodium: 140 mmol/L (ref 135–145)

## 2018-01-21 LAB — CBC
HEMATOCRIT: 32 % — AB (ref 36.0–46.0)
Hemoglobin: 9.3 g/dL — ABNORMAL LOW (ref 12.0–15.0)
MCH: 22.8 pg — ABNORMAL LOW (ref 26.0–34.0)
MCHC: 29.1 g/dL — ABNORMAL LOW (ref 30.0–36.0)
MCV: 78.4 fL — AB (ref 80.0–100.0)
NRBC: 0 % (ref 0.0–0.2)
PLATELETS: 169 10*3/uL (ref 150–400)
RBC: 4.08 MIL/uL (ref 3.87–5.11)
RDW: 17.2 % — AB (ref 11.5–15.5)
WBC: 3.3 10*3/uL — AB (ref 4.0–10.5)

## 2018-01-21 LAB — POCT I-STAT TROPONIN I: Troponin i, poc: 0 ng/mL (ref 0.00–0.08)

## 2018-01-21 LAB — I-STAT BETA HCG BLOOD, ED (NOT ORDERABLE): I-stat hCG, quantitative: <5 m[IU]/mL (ref <5)

## 2018-01-21 LAB — GROUP A STREP BY PCR: GROUP A STREP BY PCR: NOT DETECTED

## 2018-01-21 MED ORDER — HYDROXYZINE PAMOATE 50 MG PO CAPS: 100.0000 mg | ORAL_CAPSULE | Freq: Three times a day (TID) | ORAL | 0 refills | Status: DC | PRN

## 2018-01-21 MED ORDER — KETOROLAC TROMETHAMINE 30 MG/ML IJ SOLN
30.0000 mg | Freq: Once | INTRAMUSCULAR | Status: AC
  Administered 2018-01-21: 30 mg via INTRAVENOUS
  Filled 2018-01-21: qty 1

## 2018-01-21 MED ORDER — METOCLOPRAMIDE HCL 5 MG/ML IJ SOLN
10.0000 mg | Freq: Once | INTRAMUSCULAR | Status: AC
  Administered 2018-01-21: 10 mg via INTRAVENOUS
  Filled 2018-01-21: qty 2

## 2018-01-21 MED ORDER — SODIUM CHLORIDE 0.9 % IV BOLUS
1000.0000 mL | Freq: Once | INTRAVENOUS | Status: AC
  Administered 2018-01-21: 1000 mL via INTRAVENOUS

## 2018-01-21 MED ORDER — DIPHENHYDRAMINE HCL 50 MG/ML IJ SOLN
25.0000 mg | Freq: Once | INTRAMUSCULAR | Status: AC
  Administered 2018-01-21: 25 mg via INTRAVENOUS
  Filled 2018-01-21: qty 1

## 2018-01-21 NOTE — ED Provider Notes (Signed)
[facility]-EMERGENCY DEPT Provider Note   CSN: [id number] Arrival date & time: 01/21/18  1205     History   Chief Complaint Chief Complaint  Patient presents with  . Headache  . Sore Throat  . Chest Pain    HPI Julie Sanford is a 37 y.o. female.  The history is provided by the patient and medical records. No language interpreter was used.  Headache    Sore Throat  Associated symptoms include chest pain and headaches.  Chest Pain   Associated symptoms include headaches.     37 year old female with history of polysubstance abuse, bipolar, PTSD presenting complaining of flulike symptoms.  Patient mention for the past week she has had persistent throbbing frontal headache, subjective fever, chills, sinus congestion, sneezing, coughing, nausea, vomiting, myalgias and decrease in appetite.  Symptom has been persistent and not improving despite taking over-the-counter medication.  She also report recent sick contact.  She complains of increased anxiety and has been picking at his skin.  She does admits to remote history of IV drug use but none recently.  She has had her flu shot this year.  Currently symptoms moderate in severity.  No light or sound sensitivity or neck stiffness.  She denies any rash.  Past Medical History:  Diagnosis Date  . Bipolar 1 disorder (HCC)   . Depression   . Herpes   . Seizures [facility])     Patient Active Problem List   Diagnosis Date Noted  . Pain   . Cough 11/03/2016  . Cavitary lesion of lung 11/03/2016  . Hypokalemia 11/03/2016  . Prepatellar bursitis, right knee 09/27/2016  . Bipolar 1 disorder (HCC) 09/18/2016  . Protein-calorie malnutrition, severe 09/05/2016  . Pressure injury of skin 09/04/2016  . PTSD (post-traumatic stress disorder) 04/25/2011    Class: Chronic  . 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 Anemia 04/23/2011  . Cocaine abuse (HCC) 04/23/2011    Past Surgical History:  Procedure Laterality Date  .  CESAREAN SECTION       OB History   None      Home Medications    Prior to Admission medications   Medication Sig Start Date End Date Taking? Authorizing Provider  baclofen (LIORESAL) 10 MG tablet Take 10 mg by mouth 3 (three) times daily. 07/31/16   [provider]  BREO ELLIPTA 200-25 MCG/INH AEPB Inhale 1 puff into the lungs daily. 10/15/16   [provider]  clonazePAM (KLONOPIN) 0.5 MG tablet Take 0.5 tablets (0.25 mg total) by mouth every 8 (eight) hours. 11/07/16   Regalado, Belkys A, MD  disulfiram (ANTABUSE) 250 MG tablet Take 125 mg by mouth daily. 10/15/16   [provider]  gabapentin (NEURONTIN) 300 MG capsule Take 600 mg by mouth 4 (four) times daily - after meals and at bedtime. 08/15/16   [provider]  guaiFENesin (MUCINEX) 600 MG 12 hr tablet Take 2 tablets (1,200 mg total) by mouth 2 (two) times daily. Patient not taking: Reported on 03/31/2017 11/07/16   Hartley Barefoot A, MD  hydrOXYzine (VISTARIL) 50 MG capsule Take 2-4 capsules (100-200 mg total) by mouth 3 (three) times daily as needed for anxiety or itching. 07/11/17   Law, Waylan Boga, PA-C  ipratropium-albuterol (DUONEB) 0.5-2.5 (3) MG/3ML SOLN Take 3 mLs by nebulization every 6 (six) hours as needed (sob, wheezing).  08/08/16   [provider]  levETIRAcetam (KEPPRA) 500 MG tablet Take 2 tablets (1,000 mg total) by mouth 2 (two) times daily.  07/11/17   Law, Waylan BogaAlexandra M, PA-C  levofloxacin (LEVAQUIN) 500 MG tablet Take 1 tablet (500 mg total) by mouth daily. Patient not taking: Reported on 03/31/2017 11/08/16   Regalado, Jon BillingsBelkys A, MD  Lurasidone HCl (LATUDA) 60 MG TABS Take 60 mg by mouth at bedtime.     [provider]  methocarbamol (ROBAXIN) 750 MG tablet Take 2 tablets (1,500 mg total) by mouth 4 (four) times daily as needed for muscle spasms. 07/11/17   Law, Waylan BogaAlexandra M, PA-C  nicotine (NICODERM CQ - DOSED IN MG/24 HOURS) 21 mg/24hr patch Place 1 patch (21 mg total)  onto the skin daily. Patient not taking: Reported on 11/03/2016 09/28/16   Noralee Stainhoi, Jennifer, DO  PROAIR HFA 108 [phone](90 Base) MCG/ACT inhaler Inhale 2 puffs into the lungs every 6 (six) hours as needed for wheezing or shortness of breath. 11/07/16   Regalado, Jon BillingsBelkys A, MD  Rivaroxaban 15 & 20 MG TBPK Take as directed on package: Start with one 15mg  tablet by mouth twice a day with food. On Day 22, switch to one 20mg  tablet once a day with food. 07/11/17   Antony MaduraHumes, Kelly, PA-C  sertraline (ZOLOFT) 100 MG tablet Take 100 mg by mouth daily. 07/31/16   [provider]  SUBOXONE 8-2 MG FILM Place 1 Film under the tongue 3 (three) times daily. 08/21/16   [provider]  traZODone (DESYREL) 100 MG tablet Take 400 mg by mouth at bedtime. 08/13/16   [provider]    Family History Family History  Problem Relation Age of Onset  . Diabetes Sister     Social History Social History   Tobacco Use  . Smoking status: Current Every Day Smoker    Packs/day: 2.00    Types: Cigarettes  . Smokeless tobacco: Never Used  Substance Use Topics  . Alcohol use: No  . Drug use: No    Comment: denies     Allergies   Quetiapine; Tramadol; and Penicillins   Review of Systems Review of Systems  Cardiovascular: Positive for chest pain.  Neurological: Positive for headaches.  All other systems reviewed and are negative.    Physical Exam Updated Vital Signs BP 128/78   Pulse 90   Temp 98.4 F (36.9 C) (Oral)   Resp 16   Ht 5\' 1"  (1.549 m)   Wt 54.4 kg   SpO2 98%   BMI 22.67 kg/m   Physical Exam  Constitutional: She is oriented to person, place, and time. She appears well-developed and well-nourished. No distress.  Patient is nontoxic in appearance  HENT:  Head: Atraumatic.  Right Ear: External ear normal.  Left Ear: External ear normal.  Mouth/Throat: Oropharynx is clear and moist.  Eyes: Conjunctivae are normal.  Neck: Normal range of motion. Neck supple.  No nuchal  rigidity  Cardiovascular: Normal rate and regular rhythm.  Pulmonary/Chest: Effort normal and breath sounds normal.  Abdominal: Soft. Bowel sounds are normal. She exhibits no distension. There is no tenderness.  Neurological: She is alert and oriented to person, place, and time.  Skin: No rash noted.  Psychiatric: She has a normal mood and affect.  Nursing note and vitals reviewed.    ED Treatments / Results  Labs (all labs ordered are listed, but only abnormal results are displayed) Labs Reviewed  CBC - Abnormal; Notable for the following components:      Result Value   WBC 3.3 (*)    Hemoglobin 9.3 (*)    HCT 32.0 (*)  MCV 78.4 (*)    MCH 22.8 (*)    MCHC 29.1 (*)    RDW 17.2 (*)    All other components within normal limits  GROUP A STREP BY PCR  BASIC METABOLIC PANEL  I-STAT TROPONIN, ED  I-STAT BETA HCG BLOOD, ED ([facility], [facility], [facility] ONLY)  I-STAT BETA HCG BLOOD, ED (NOT ORDERABLE)  POCT I-STAT TROPONIN I    EKG None  ED ECG REPORT   Date: 01/21/2018  Rate: 111  Rhythm: sinus tachycardia  QRS Axis: normal  Intervals: normal  ST/T Wave abnormalities: nonspecific ST changes  Conduction Disutrbances:none  Narrative Interpretation:   Old EKG Reviewed: unchanged  I have personally reviewed the EKG tracing and agree with the computerized printout as noted.   Radiology Dg Chest 2 View  Result Date: 01/21/2018 CLINICAL DATA:  Chest pain with cough EXAM: CHEST - 2 VIEW COMPARISON:  Chest radiograph Jul 10, 2017 and chest CT Jul 11, 2017 FINDINGS: Lungs are clear. Heart size and pulmonary vascularity are normal. No adenopathy. No pneumothorax. No bone lesions. IMPRESSION: No edema or consolidation. Electronically Signed   By: Bretta Bang III M.D.   On: 01/21/2018 13:01    Procedures Procedures (including critical care time)  Medications Ordered in ED Medications  sodium chloride 0.9 % bolus 1,000 mL (0 mLs Intravenous Stopped 01/21/18 1526)  metoCLOPramide  (REGLAN) injection 10 mg (10 mg Intravenous Given 01/21/18 1414)  diphenhydrAMINE (BENADRYL) injection 25 mg (25 mg Intravenous Given 01/21/18 1414)  ketorolac (TORADOL) 30 MG/ML injection 30 mg (30 mg Intravenous Given 01/21/18 1414)     Initial Impression / Assessment and Plan / ED Course  I have reviewed the triage vital signs and the nursing notes.  Pertinent labs & imaging results that were available during my care of the patient were reviewed by me and considered in my medical decision making (see chart for details).     BP 117/80   Pulse 96   Temp 98.4 F (36.9 C) (Oral)   Resp 16   Ht 5\' 1"  (1.549 m)   Wt 54.4 kg   SpO2 96%   BMI 22.67 kg/m    Final Clinical Impressions(s) / ED Diagnoses   Final diagnoses:  Viral illness    ED Discharge Orders         Ordered    hydrOXYzine (VISTARIL) 50 MG capsule  3 times daily PRN     01/21/18 1504         1:26 PM Patient here with cold symptoms.  Her symptoms also include headache.  She does not have any nuchal rigidity concerning for meningitis.  She does have remote history of IV drug use but she is well-appearing and considering her symptom has been ongoing for a week or more I have low suspicion for acute emergent medical condition.  2:33 PM Pregnancy test is negative, initial troponin unremarkable, normal electrolytes panel, WBC is 3.3, hemoglobin is 9.3 weeks at her baseline.  Strep test is negative, chest x-ray unremarkable.  3:35 PM Pt received migraine cocktail with improvement of sxs.  Vital sign stable.  Pt received IVF as well.  I prescribed vistaril as needed for anxiety.  outpt f/u recommended.  Return precaution given.    Fayrene Helper, PA-C 01/21/18 1536    Tilden Fossa, MD 01/22/18 [phone]

## 2018-01-28 ENCOUNTER — Emergency Department (HOSPITAL_COMMUNITY): Payer: Medicaid Other

## 2018-01-28 ENCOUNTER — Other Ambulatory Visit: Payer: Self-pay

## 2018-01-28 ENCOUNTER — Encounter (HOSPITAL_COMMUNITY): Payer: Self-pay | Admitting: *Deleted

## 2018-01-28 ENCOUNTER — Emergency Department (HOSPITAL_COMMUNITY)
Admission: EM | Admit: 2018-01-28 | Discharge: 2018-01-29 | Disposition: A | Discharge status: Home / Self Care | Payer: Medicaid Other | Attending: Emergency Medicine | Admitting: Emergency Medicine

## 2018-01-28 ENCOUNTER — Inpatient Hospital Stay (HOSPITAL_COMMUNITY): Admission: AD | Admit: 2018-01-28 | Payer: PRIVATE HEALTH INSURANCE | Source: Intra-hospital | Admitting: Psychiatry

## 2018-01-28 DIAGNOSIS — F314 Bipolar disorder, current episode depressed, severe, without psychotic features: Secondary | ICD-10-CM | POA: Diagnosis not present

## 2018-01-28 DIAGNOSIS — J209 Acute bronchitis, unspecified: Secondary | ICD-10-CM | POA: Insufficient documentation

## 2018-01-28 DIAGNOSIS — R51 Headache: Secondary | ICD-10-CM | POA: Diagnosis present

## 2018-01-28 DIAGNOSIS — F17210 Nicotine dependence, cigarettes, uncomplicated: Secondary | ICD-10-CM | POA: Diagnosis not present

## 2018-01-28 DIAGNOSIS — Z79899 Other long term (current) drug therapy: Secondary | ICD-10-CM | POA: Insufficient documentation

## 2018-01-28 DIAGNOSIS — F1994 Other psychoactive substance use, unspecified with psychoactive substance-induced mood disorder: Secondary | ICD-10-CM | POA: Diagnosis not present

## 2018-01-28 DIAGNOSIS — R45851 Suicidal ideations: Secondary | ICD-10-CM | POA: Diagnosis not present

## 2018-01-28 DIAGNOSIS — J111 Influenza due to unidentified influenza virus with other respiratory manifestations: Secondary | ICD-10-CM | POA: Insufficient documentation

## 2018-01-28 DIAGNOSIS — R05 Cough: Secondary | ICD-10-CM | POA: Insufficient documentation

## 2018-01-28 DIAGNOSIS — Z915 Personal history of self-harm: Secondary | ICD-10-CM | POA: Diagnosis not present

## 2018-01-28 DIAGNOSIS — R059 Cough, unspecified: Secondary | ICD-10-CM

## 2018-01-28 DIAGNOSIS — F4310 Post-traumatic stress disorder, unspecified: Secondary | ICD-10-CM | POA: Diagnosis not present

## 2018-01-28 DIAGNOSIS — F1120 Opioid dependence, uncomplicated: Secondary | ICD-10-CM | POA: Insufficient documentation

## 2018-01-28 DIAGNOSIS — D649 Anemia, unspecified: Secondary | ICD-10-CM | POA: Diagnosis not present

## 2018-01-28 LAB — COMPREHENSIVE METABOLIC PANEL
ALT: 25 U/L (ref 0–44)
AST: 46 U/L — ABNORMAL HIGH (ref 15–41)
Albumin: 3.6 g/dL (ref 3.5–5.0)
Alkaline Phosphatase: 50 U/L (ref 38–126)
Anion gap: 7 (ref 5–15)
BILIRUBIN TOTAL: 0.6 mg/dL (ref 0.3–1.2)
BUN: 8 mg/dL (ref 6–20)
CO2: 27 mmol/L (ref 22–32)
Calcium: 7.7 mg/dL — ABNORMAL LOW (ref 8.9–10.3)
Chloride: 104 mmol/L (ref 98–111)
Creatinine, Ser: 0.85 mg/dL (ref 0.44–1.00)
GFR calc Af Amer: >60 mL/min (ref >60)
GFR calc non Af Amer: >60 mL/min (ref >60)
Glucose, Bld: 87 mg/dL (ref 70–99)
Potassium: 3.3 mmol/L — ABNORMAL LOW (ref 3.5–5.1)
Sodium: 138 mmol/L (ref 135–145)
TOTAL PROTEIN: 6.2 g/dL — AB (ref 6.5–8.1)

## 2018-01-28 LAB — CBC WITH DIFFERENTIAL/PLATELET
Abs Immature Granulocytes: 0 10*3/uL (ref 0.00–0.07)
Basophils Absolute: 0 10*3/uL (ref 0.0–0.1)
Basophils Relative: 0 %
Eosinophils Absolute: 0 10*3/uL (ref 0.0–0.5)
Eosinophils Relative: 1 %
HCT: 26.1 % — ABNORMAL LOW (ref 36.0–46.0)
Hemoglobin: 7.5 g/dL — ABNORMAL LOW (ref 12.0–15.0)
Immature Granulocytes: 0 %
Lymphocytes Relative: 44 %
Lymphs Abs: 1 10*3/uL (ref 0.7–4.0)
MCH: 22.7 pg — ABNORMAL LOW (ref 26.0–34.0)
MCHC: 28.7 g/dL — AB (ref 30.0–36.0)
MCV: 79.1 fL — ABNORMAL LOW (ref 80.0–100.0)
Monocytes Absolute: 0.4 10*3/uL (ref 0.1–1.0)
Monocytes Relative: 16 %
Neutro Abs: 0.9 10*3/uL — ABNORMAL LOW (ref 1.7–7.7)
Neutrophils Relative %: 39 %
PLATELETS: 135 10*3/uL — AB (ref 150–400)
RBC: 3.3 MIL/uL — AB (ref 3.87–5.11)
RDW: 17.4 % — ABNORMAL HIGH (ref 11.5–15.5)
WBC: 2.4 10*3/uL — ABNORMAL LOW (ref 4.0–10.5)
nRBC: 0 % (ref 0.0–0.2)

## 2018-01-28 LAB — ACETAMINOPHEN LEVEL: Acetaminophen (Tylenol), Serum: 11 ug/mL (ref 10–30)

## 2018-01-28 LAB — INFLUENZA PANEL BY PCR (TYPE A & B)
Influenza A By PCR: NEGATIVE
Influenza B By PCR: POSITIVE — AB

## 2018-01-28 LAB — RAPID URINE DRUG SCREEN, HOSP PERFORMED
Amphetamines: POSITIVE — AB
Barbiturates: NOT DETECTED
Benzodiazepines: POSITIVE — AB
Cocaine: NOT DETECTED
OPIATES: POSITIVE — AB
Tetrahydrocannabinol: NOT DETECTED

## 2018-01-28 LAB — SALICYLATE LEVEL: Salicylate Lvl: <7 mg/dL (ref 2.8–30.0)

## 2018-01-28 LAB — I-STAT BETA HCG BLOOD, ED (MC, WL, AP ONLY): I-stat hCG, quantitative: <5 m[IU]/mL (ref <5)

## 2018-01-28 LAB — POC OCCULT BLOOD, ED: Fecal Occult Bld: NEGATIVE

## 2018-01-28 LAB — ETHANOL: Alcohol, Ethyl (B): <10 mg/dL (ref <10)

## 2018-01-28 MED ORDER — ACETAMINOPHEN 325 MG PO TABS
650.0000 mg | ORAL_TABLET | ORAL | Status: DC | PRN
  Administered 2018-01-28 – 2018-01-29 (×3): 650 mg via ORAL
  Filled 2018-01-28 (×3): qty 2

## 2018-01-28 MED ORDER — ALBUTEROL SULFATE HFA 108 (90 BASE) MCG/ACT IN AERS
2.0000 | INHALATION_SPRAY | Freq: Once | RESPIRATORY_TRACT | Status: AC
  Administered 2018-01-28: 2 via RESPIRATORY_TRACT
  Filled 2018-01-28: qty 6.7

## 2018-01-28 MED ORDER — ALBUTEROL SULFATE HFA 108 (90 BASE) MCG/ACT IN AERS: 2.0000 | INHALATION_SPRAY | RESPIRATORY_TRACT | Status: DC | PRN

## 2018-01-28 MED ORDER — HYDROXYZINE HCL 25 MG PO TABS
100.0000 mg | ORAL_TABLET | Freq: Three times a day (TID) | ORAL | Status: DC | PRN
  Administered 2018-01-29: 100 mg via ORAL
  Filled 2018-01-28: qty 4

## 2018-01-28 MED ORDER — ONDANSETRON HCL 4 MG PO TABS: 4.0000 mg | ORAL_TABLET | Freq: Three times a day (TID) | ORAL | Status: DC | PRN

## 2018-01-28 MED ORDER — BENZONATATE 100 MG PO CAPS
100.0000 mg | ORAL_CAPSULE | Freq: Two times a day (BID) | ORAL | Status: DC | PRN
  Administered 2018-01-28 – 2018-01-29 (×3): 100 mg via ORAL
  Filled 2018-01-28 (×3): qty 1

## 2018-01-28 MED ORDER — DOXYCYCLINE HYCLATE 100 MG PO TABS
100.0000 mg | ORAL_TABLET | Freq: Once | ORAL | Status: AC
  Administered 2018-01-28: 100 mg via ORAL
  Filled 2018-01-28: qty 1

## 2018-01-28 MED ORDER — DOXYCYCLINE HYCLATE 100 MG PO TABS
100.0000 mg | ORAL_TABLET | Freq: Two times a day (BID) | ORAL | Status: DC
  Administered 2018-01-28 – 2018-01-29 (×2): 100 mg via ORAL
  Filled 2018-01-28 (×2): qty 1

## 2018-01-28 MED ORDER — METOCLOPRAMIDE HCL 5 MG/ML IJ SOLN
10.0000 mg | Freq: Once | INTRAMUSCULAR | Status: AC
  Administered 2018-01-28: 10 mg via INTRAMUSCULAR
  Filled 2018-01-28: qty 2

## 2018-01-28 MED ORDER — KETOROLAC TROMETHAMINE 15 MG/ML IJ SOLN
15.0000 mg | Freq: Once | INTRAMUSCULAR | Status: AC
  Administered 2018-01-28: 15 mg via INTRAMUSCULAR
  Filled 2018-01-28: qty 1

## 2018-01-28 MED ORDER — LEVETIRACETAM 500 MG PO TABS
1000.0000 mg | ORAL_TABLET | Freq: Two times a day (BID) | ORAL | Status: DC
  Administered 2018-01-28 – 2018-01-29 (×3): 1000 mg via ORAL
  Filled 2018-01-28 (×3): qty 2

## 2018-01-28 MED ORDER — POTASSIUM CHLORIDE CRYS ER 20 MEQ PO TBCR
20.0000 meq | EXTENDED_RELEASE_TABLET | Freq: Two times a day (BID) | ORAL | Status: AC
  Administered 2018-01-28 – 2018-01-29 (×2): 20 meq via ORAL
  Filled 2018-01-28 (×2): qty 1

## 2018-01-28 MED ORDER — ALUM & MAG HYDROXIDE-SIMETH 200-200-20 MG/5ML PO SUSP: 30.0000 mL | Freq: Four times a day (QID) | ORAL | Status: DC | PRN

## 2018-01-28 MED ORDER — DIPHENHYDRAMINE HCL 25 MG PO CAPS
50.0000 mg | ORAL_CAPSULE | Freq: Once | ORAL | Status: AC
  Administered 2018-01-28: 50 mg via ORAL
  Filled 2018-01-28: qty 2

## 2018-01-28 MED ORDER — HYDROXYZINE PAMOATE 50 MG PO CAPS: 100.0000 mg | ORAL_CAPSULE | Freq: Three times a day (TID) | ORAL | Status: DC | PRN

## 2018-01-28 NOTE — BH Assessment (Signed)
[facility] related to necessary contact precautions per MD Clary.

## 2018-01-28 NOTE — BHH Counselor (Signed)
[facility] tested [facility] for the flu [facility]

## 2018-01-28 NOTE — ED Notes (Signed)
Bed: WA27 Expected date:  Expected time:  Means of arrival:  Comments: Held for 24

## 2018-01-28 NOTE — ED Notes (Signed)
Patient arrived to bed 40.  When nurse arrived in room patient was sleeping soundly and would not wake up despite her name being called several times.
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
## 2018-01-28 NOTE — ED Notes (Signed)
[facility].  Patient moved back to TCU due to being on droplet precautions.

## 2018-01-28 NOTE — BH Assessment (Signed)
[facility]  Per Juanetta BeetsJacqueline Norman, DO, this pt requires psychiatric hospitalization at this time.  Berneice Heinrichina Tate, RN, [facility] Rm [id number].  Pt has signed Voluntary Admission and Consent for Treatment, as well as Consent to Release Information to no one, and signed forms [facility].  Pt's nurse, Aram BeechamCynthia, has been notified, and agrees to send original paperwork along with pt via [facility], and to call report to [phone].  Doylene Canninghomas Allanna Bresee, [place] Behavioral Health Coordinator [phone]

## 2018-01-28 NOTE — ED Triage Notes (Signed)
[facility] arrives [facility] with reports that pt has had migraine for 3 weeks. N/v for about 1 week, diarrhea for 3 days, EMS vitals: 118/58, hr 100, 16 r, 97% RA.

## 2018-01-28 NOTE — ED Provider Notes (Signed)
[facility]-EMERGENCY DEPT Provider Note   CSN: [id number] Arrival date & time: 01/28/18  7846     History   Chief Complaint Chief Complaint  Patient presents with  . Migraine    HPI Julie Sanford is a 37 y.o. female.  Patient with history of polysubstance abuse, bipolar, PTSD --presents to the emergency department with ongoing flulike symptoms.  Patient was seen in the emergency department 1 week ago for similar symptoms.  She had lab work-up and chest x-ray at that time which were reassuring.  She was treated with a migraine cocktail with reported improvement.  Patient states that she continues to have cough, frontal headache, nausea, vomiting, diarrhea.  She also complains of extreme anxiety which is causing her to pick at her skin.  She was prescribed Vistaril at last visit but this is not helping.  Reports recently being in prison.  States that she snorted heroin approximately a week ago after being released from prison.  Denies other drug use.  Main complaint is persistent cough which causes her to vomit as well as frontal headache. Current medications are not helping. No fevers, night sweats, weight loss, hemoptysis.  Patient with history of questionable bullae versus cavitary lesion on chest CT in 2018.  No abnormalities seen on chest CT from 06/2017.  Patient has not had any chest x-ray abnormalities including previous 1 week ago.      Past Medical History:  Diagnosis Date  . Bipolar 1 disorder (HCC)   . Depression   . Herpes   . Seizures [facility])     Patient Active Problem List   Diagnosis Date Noted  . Pain   . Cough 11/03/2016  . Cavitary lesion of lung 11/03/2016  . Hypokalemia 11/03/2016  . Prepatellar bursitis, right knee 09/27/2016  . Bipolar 1 disorder (HCC) 09/18/2016  . Protein-calorie malnutrition, severe 09/05/2016  . Pressure injury of skin 09/04/2016  . PTSD (post-traumatic stress disorder) 04/25/2011    Class: Chronic  . Heroin  addiction (HCC) 04/23/2011  . Anemia 04/23/2011  . Cocaine abuse (HCC) 04/23/2011    Past Surgical History:  Procedure Laterality Date  . CESAREAN SECTION       OB History   No obstetric history on file.      Home Medications    Prior to Admission medications   Medication Sig Start Date End Date Taking? Authorizing Provider  hydrOXYzine (VISTARIL) 50 MG capsule Take 2-4 capsules (100-200 mg total) by mouth 3 (three) times daily as needed for anxiety or itching. 01/21/18   Fayrene Helper, PA-C  ipratropium-albuterol (DUONEB) 0.5-2.5 (3) MG/3ML SOLN Take 3 mLs by nebulization every 6 (six) hours as needed (sob, wheezing).  08/08/16   [provider]  levETIRAcetam (KEPPRA) 500 MG tablet Take 2 tablets (1,000 mg total) by mouth 2 (two) times daily. Patient not taking: Reported on 01/21/2018 07/11/17   Emi Holes, PA-C  Lurasidone HCl (LATUDA) 60 MG TABS Take 60 mg by mouth at bedtime.     [provider]  nicotine (NICODERM CQ - DOSED IN MG/24 HOURS) 21 mg/24hr patch Place 1 patch (21 mg total) onto the skin daily. Patient not taking: Reported on 01/21/2018 09/28/16   Noralee Stain, DO  Rivaroxaban 15 & 20 MG TBPK Take as directed on package: Start with one 15mg  tablet by mouth twice a day with food. On Day 22, switch to one 20mg  tablet once a day with food. Patient not taking: Reported on 01/21/2018 07/11/17  Antony Madura, PA-C  sertraline (ZOLOFT) 100 MG tablet Take 100 mg by mouth daily. 07/31/16   [provider]  SUBOXONE 8-2 MG FILM Place 1 Film under the tongue 3 (three) times daily. 08/21/16   [provider]  traZODone (DESYREL) 100 MG tablet Take 400 mg by mouth at bedtime. 08/13/16   [provider]    Family History Family History  Problem Relation Age of Onset  . Diabetes Sister     Social History Social History   Tobacco Use  . Smoking status: Current Every Day Smoker    Packs/day: 2.00    Types: Cigarettes  .  Smokeless tobacco: Never Used  Substance Use Topics  . Alcohol use: No  . Drug use: No    Comment: denies     Allergies   Quetiapine; Tramadol; and Penicillins   Review of Systems Review of Systems  Constitutional: Negative for chills and fever.  HENT: Negative for rhinorrhea and sore throat.   Eyes: Negative for redness.  Respiratory: Positive for cough.   Cardiovascular: Negative for chest pain.  Gastrointestinal: Positive for diarrhea, nausea and vomiting. Negative for abdominal pain.  Genitourinary: Negative for dysuria.  Musculoskeletal: Positive for myalgias.  Skin: Positive for wound. Negative for rash.  Neurological: Positive for headaches.  Psychiatric/Behavioral: Positive for sleep disturbance.     Physical Exam Updated Vital Signs BP 113/74   Pulse (!) 101   Temp 98.2 F (36.8 C) (Oral)   Resp 20   SpO2 98%   Physical Exam Vitals signs and nursing note reviewed.  Constitutional:      Appearance: She is well-developed.  HENT:     Head: Normocephalic and atraumatic.     Jaw: No trismus.     Right Ear: Tympanic membrane, ear canal and external ear normal.     Left Ear: Tympanic membrane, ear canal and external ear normal.     Nose: Nose normal. No mucosal edema or rhinorrhea.     Mouth/Throat:     Mouth: Mucous membranes are not dry. No oral lesions.     Pharynx: Uvula midline. No oropharyngeal exudate, posterior oropharyngeal erythema or uvula swelling.     Tonsils: No tonsillar abscesses.  Eyes:     General:        Right eye: No discharge.        Left eye: No discharge.     Conjunctiva/sclera: Conjunctivae normal.  Neck:     Musculoskeletal: Normal range of motion and neck supple.  Cardiovascular:     Rate and Rhythm: Normal rate and regular rhythm.     Heart sounds: Normal heart sounds.  Pulmonary:     Effort: Pulmonary effort is normal. No respiratory distress.     Breath sounds: Normal breath sounds. No wheezing or rales.     Comments:  Active coughing during exam Abdominal:     Palpations: Abdomen is soft.     Tenderness: There is no abdominal tenderness.  Lymphadenopathy:     Cervical: No cervical adenopathy.  Skin:    General: Skin is warm and dry.     Findings: Lesion present.     Comments: Patient with skin lesions over her face, arms from where she has been picking at her skin.  None appear actively infected.  Neurological:     Mental Status: She is alert.      ED Treatments / Results  Labs (all labs ordered are listed, but only abnormal results are displayed) Labs Reviewed  COMPREHENSIVE  METABOLIC PANEL - Abnormal; Notable for the following components:      Result Value   Potassium 3.3 (*)    Calcium 7.7 (*)    Total Protein 6.2 (*)    AST 46 (*)    All other components within normal limits  CBC WITH DIFFERENTIAL/PLATELET - Abnormal; Notable for the following components:   WBC 2.4 (*)    RBC 3.30 (*)    Hemoglobin 7.5 (*)    HCT 26.1 (*)    MCV 79.1 (*)    MCH 22.7 (*)    MCHC 28.7 (*)    RDW 17.4 (*)    Platelets 135 (*)    Neutro Abs 0.9 (*)    All other components within normal limits  ETHANOL  ACETAMINOPHEN LEVEL  SALICYLATE LEVEL  RAPID URINE DRUG SCREEN, HOSP PERFORMED  I-STAT BETA HCG BLOOD, ED ([facility], [facility], [facility] ONLY)  POC OCCULT BLOOD, ED    EKG None  Radiology Dg Chest 2 View  Result Date: 01/28/2018 CLINICAL DATA:  Migraine headache EXAM: CHEST - 2 VIEW COMPARISON:  01/21/2018 FINDINGS: The heart size and mediastinal contours are within normal limits. Both lungs are clear. The visualized skeletal structures are unremarkable. IMPRESSION: No active cardiopulmonary disease. Electronically Signed   By: Marlan Palauharles  Clark M.D.   On: 01/28/2018 09:01    Procedures Procedures (including critical care time)  Medications Ordered in ED Medications  albuterol (PROVENTIL HFA;VENTOLIN HFA) 108 (90 Base) MCG/ACT inhaler 2 puff (has no administration in time range)  doxycycline (VIBRA-TABS)  tablet 100 mg (has no administration in time range)  metoCLOPramide (REGLAN) injection 10 mg (10 mg Intramuscular Given 01/28/18 0740)  diphenhydrAMINE (BENADRYL) capsule 50 mg (50 mg Oral Given 01/28/18 0741)  ketorolac (TORADOL) 15 MG/ML injection 15 mg (15 mg Intramuscular Given 01/28/18 0741)     Initial Impression / Assessment and Plan / ED Course  I have reviewed the triage vital signs and the nursing notes.  Pertinent labs & imaging results that were available during my care of the patient were reviewed by me and considered in my medical decision making (see chart for details).     Patient seen and examined.  Will give IM meds for headache.  Reviewed work-up from previous visit including chest x-ray which was negative.  Patient likely warrants treatment for bronchitis at this point.  Patient with risk factors for tuberculosis however recent negative chest x-ray.  Vital signs reviewed and are as follows: BP 113/74   Pulse (!) 101   Temp 98.2 F (36.8 C) (Oral)   Resp 20   SpO2 98%   8:23 AM patient reexamined.  Patient sleeping upon entering the room.  She states that she is feeling a little bit better.  However patient now reveals that she is having suicidal ideations.  She states that she has been depressed and that yesterday she was thinking about jumping off of a tower that is near her house.  She reports emotional distress over the loss of children, that is worse around the time of the holidays.  Offered evaluation and patient agrees.  Medical screening labs ordered.  Patient will be treated for bronchitis.  Will recheck chest x-ray.  Upon completion of work-up, will contact TTS for evaluation.  10:41 AM medical screening evaluation undertaken.  Patient does have worsening of her chronic anemia.  She denies any bleeding in the stool.  I performed a rectal exam with NT chaperone with brown stool.  Heme-negative.  I do not  suspect any active bleeding.  Patient is asymptomatic  from an anemia standpoint.  I feel that she is stable for psych evaluation.  No indications for an acute admission at this time however she will need to have her hemoglobin rechecked and be on treatment for bronchitis.  No pneumonia on chest x-ray.  TTS consult requested.  2:15 PM TTS has seen. She meets inpt criteria.   She will need rx doxycycline 100mg  bid x 1 week for bronchitis. Also given albuterol inhaler 2 puffs every 4 hrs as needed for cough and wheezing. I have ordered these scheduled doses.    Final Clinical Impressions(s) / ED Diagnoses   Final diagnoses:  Suicidal ideation  Chronic anemia  Cough   Pt with chronic medical problems but no indications for admission currently. Treat for suspected bronchitis as above. TTS eval completed, pending placement for ongoing SI.    ED Discharge Orders    None       Renne Crigler, PA-C 01/28/18 1420    Azalia Bilis, MD 01/29/18 [phone]

## 2018-01-28 NOTE — ED Notes (Signed)
I have just given her meds. She informs me that she is "having suicidal thoughts". She further tells me that "I've been having these headaches for a while and no body'll do anything about them" [sic]. She is alert and oriented x 4 with clear speech.

## 2018-01-29 DIAGNOSIS — F1994 Other psychoactive substance use, unspecified with psychoactive substance-induced mood disorder: Secondary | ICD-10-CM | POA: Diagnosis present

## 2018-01-29 MED ORDER — DOXYCYCLINE HYCLATE 100 MG PO CAPS: 100.0000 mg | ORAL_CAPSULE | Freq: Two times a day (BID) | ORAL | 0 refills | Status: AC

## 2018-01-29 MED ORDER — OSELTAMIVIR PHOSPHATE 75 MG PO CAPS
75.0000 mg | ORAL_CAPSULE | Freq: Two times a day (BID) | ORAL | Status: DC
  Administered 2018-01-29: 75 mg via ORAL
  Filled 2018-01-29: qty 1

## 2018-01-29 NOTE — ED Provider Notes (Signed)
[facility] once the patient is Fever free x 48 hours.  No fever thus far in the ER. Will initiate tamiflu at this time  [facility] she will need to wear a mask for an additional 48 hours. Normal hand hygiene will be recommended   Julie Sanford, Rayder Sullenger, MD 01/29/18 [id number]

## 2018-01-29 NOTE — ED Notes (Signed)
Psych team rounding on patient at this time

## 2018-01-29 NOTE — ED Provider Notes (Signed)
Cleared for discharge from a behavioral health standpoint by the psychiatry team. Medically stable for discharge. Home on doxy for bronchitis   Julie Sanford, Julie Belue, MD 01/29/18 1034

## 2018-01-29 NOTE — Consult Note (Addendum)
[facility]  01/29/2018 10:49 AM Julie FriesChristina M Sanford  MRN:  [id number] Principal Problem: Substance induced mood disorder [facility](HCC) Discharge Diagnoses: Principal Problem:   Substance induced mood disorder (HCC)   Subjective: Pt was seen and chart reviewed with treatment team and Dr Sharma CovertNorman. Pt endorses SI without a plan or intention to harm self. She reports depression due to this time of the year being difficult for her. Pt denies homicidal ideation, denies auditory/visual hallucinations and does not appear to be responding to internal stimuli. Pt stated she has had a bad cold and that is why she initially came to the hospital. According to previous notes, patient told the EDP she was having suicidal ideation. Pt was released form prison one month ago and relapsed on heroin. Pt's UDS positive for opiates, cocaine, and benzos, BAL negative. Pt denies that she is using amphetamines. Pt tested positive for influenza and was placed on droplet precautions. Pt has been afebrile for 24 hours and is medically clear. Pt will be seen by Peer Support for substance abuse treatment options in the community. Pt is able to contract for safety and lives with her mother. Pt stated she has a good relationship with her mother and her home life is good. Pt was last hospitalized 2 years ago (she thinks) [facility] and stated she had attempted suicide by overdose. Pt stated  does not know what medications she was on. Pt stated she is interested in outpatient resources for psychiatry and medication management. Pt is psychiatrically clear for discharge.    Total Time spent with patient: 30 minutes  Past Psychiatric History: Depression, anxiety and polysubstance abuse.   Past Medical History:  Past Medical History:  Diagnosis Date  . Bipolar 1 disorder (HCC)   . Depression   . Herpes   . Seizures (HCC)     Past Surgical History:  Procedure Laterality Date  . CESAREAN  SECTION     Family History:  Family History  Problem Relation Age of Onset  . Diabetes Sister    Family Psychiatric  History: Pt denies any family history of mental health diagnoses Social History:  Social History   Substance and Sexual Activity  Alcohol Use No     Social History   Substance and Sexual Activity  Drug Use No   Comment: denies    Social History   Socioeconomic History  . Marital status: Legally Separated    Spouse name: Not on file  . Number of children: Not on file  . Years of education: Not on file  . Highest education level: Not on file  Occupational History  . Not on file  Social Needs  . Financial resource strain: Not on file  . Food insecurity:    Worry: Not on file    Inability: Not on file  . Transportation needs:    Medical: Not on file    Non-medical: Not on file  Tobacco Use  . Smoking status: Current Every Day Smoker    Packs/day: 2.00    Types: Cigarettes  . Smokeless tobacco: Never Used  Substance and Sexual Activity  . Alcohol use: No  . Drug use: No    Comment: denies  . Sexual activity: Never    Comment: heroin  Lifestyle  . Physical activity:    Days per week: Not on file    Minutes per session: Not on file  . Stress: Not on file  Relationships  . Social connections:  Talks on phone: Not on file    Gets together: Not on file    Attends religious service: Not on file    Active member of club or organization: Not on file    Attends meetings of clubs or organizations: Not on file    Relationship status: Not on file  Other Topics Concern  . Not on file  Social History Narrative  . Not on file    Has this patient used any form of tobacco in the last 30 days? (Cigarettes, Smokeless Tobacco, Cigars, and/or Pipes) Prescription not provided because: Pt declined  Current Medications: Current Facility-Administered Medications  Medication Dose Route Frequency Provider Last Rate Last Dose  . acetaminophen (TYLENOL) tablet  650 mg  650 mg Oral Q4H PRN Renne Crigler, PA-C   650 mg at 01/29/18 1048  . albuterol (PROVENTIL HFA;VENTOLIN HFA) 108 (90 Base) MCG/ACT inhaler 2 puff  2 puff Inhalation Q4H PRN Renne Crigler, PA-C      . alum & mag hydroxide-simeth (MAALOX/MYLANTA) 200-200-20 MG/5ML suspension 30 mL  30 mL Oral Q6H PRN Renne Crigler, PA-C      . benzonatate (TESSALON) capsule 100 mg  100 mg Oral BID PRN Renne Crigler, PA-C   100 mg at 01/29/18 0421  . doxycycline (VIBRA-TABS) tablet 100 mg  100 mg Oral Q12H Renne Crigler, PA-C   100 mg at 01/29/18 1047  . hydrOXYzine (ATARAX/VISTARIL) tablet 100 mg  100 mg Oral TID PRN Alvira Monday, MD   100 mg at 01/29/18 0421  . levETIRAcetam (KEPPRA) tablet 1,000 mg  1,000 mg Oral BID Renne Crigler, PA-C   1,000 mg at 01/29/18 1047  . ondansetron (ZOFRAN) tablet 4 mg  4 mg Oral Q8H PRN Renne Crigler, PA-C      . oseltamivir (TAMIFLU) capsule 75 mg  75 mg Oral BID Azalia Bilis, MD   75 mg at 01/29/18 1048   Current Outpatient Medications  Medication Sig Dispense Refill  . doxycycline (VIBRAMYCIN) 100 MG capsule Take 1 capsule (100 mg total) by mouth 2 (two) times daily for 6 days. 12 capsule 0  . nicotine (NICODERM CQ - DOSED IN MG/24 HOURS) 21 mg/24hr patch Place 1 patch (21 mg total) onto the skin daily. (Patient not taking: Reported on 01/21/2018) 28 patch 0     Musculoskeletal: Strength & Muscle Tone: within normal limits Gait & Station: normal Patient leans: N/A  Psychiatric Specialty Exam: Physical Exam  Nursing note and vitals reviewed. Constitutional: She is oriented to person, place, and time. She appears well-developed and well-nourished.  HENT:  Head: Normocephalic and atraumatic.  Neck: Normal range of motion.  Respiratory: Effort normal.  Musculoskeletal: Normal range of motion.  Neurological: She is alert and oriented to person, place, and time.  Psychiatric: Her speech is normal and behavior is normal. Judgment normal. Cognition and  memory are normal. She exhibits a depressed mood. She expresses suicidal ideation. She expresses no suicidal plans.    Review of Systems  Psychiatric/Behavioral: Positive for depression, substance abuse and suicidal ideas (no plan or intention.). Negative for hallucinations.  All other systems reviewed and are negative.   Blood pressure 118/90, pulse 90, temperature 97.8 F (36.6 C), temperature source Oral, resp. rate 18, SpO2 96 %.There is no height or weight on file to calculate BMI.  General Appearance: Casual  Eye Contact:  Good  Speech:  Clear and Coherent and Normal Rate  Volume:  Normal  Mood:  Anxious  Affect:  Congruent  Thought Process:  Coherent,  Goal Directed, Linear and Descriptions of Associations: Intact  Orientation:  Full (Time, Place, and Person)  Thought Content:  Logical  Suicidal Thoughts:  Yes.  without intent/plan  Homicidal Thoughts:  No  Memory:  Immediate;   Good Recent;   Good Remote;   Fair  Judgement:  Fair  Insight:  Fair  Psychomotor Activity:  Increased and Restlessness  Concentration:  Concentration: Good and Attention Span: Good  Recall:  Good  Fund of Knowledge:  Good  Language:  Good  Akathisia:  No  Handed:  Right  AIMS (if indicated):   N/A  Assets:  Communication Skills Housing Social Support  ADL's:  Intact  Cognition:  WNL  Sleep:   N/A     Demographic Factors:  Caucasian, Low socioeconomic status and Unemployed  Loss Factors: Financial problems/change in socioeconomic status  Historical Factors: Prior suicide attempts  Risk Reduction Factors:   Sense of responsibility to family and Living with another person, especially a relative  Continued Clinical Symptoms:  Alcohol/Substance Abuse/Dependencies  Cognitive Features That Contribute To Risk:  Closed-mindedness    Suicide Risk:  Minimal: No identifiable suicidal ideation.  Patients presenting with no risk factors but with morbid ruminations; may be classified as  minimal risk based on the severity of the depressive symptoms  Follow-up Information    Schedule an appointment as soon as possible for a visit  with  [facility].   Contact information: [place] [place] [phone]       Schedule an appointment as soon as possible for a visit  with [facility].   Specialty:  [facility] Contact information: [address] [place] [address] [phone]           Plan Of Care/Follow-up recommendations:  Activity:  as tolerated Diet:  heart healthy  Disposition:Substance induced mood disorder (HCC)  Take all medications as prescribed by your outpatient providers. Keep all follow-up appointments as scheduled, make an appointment at [facility], for medication management,  when [facility] are [facility].   Do not consume alcohol or use illegal drugs while on prescription medications. Report any adverse effects from your medications to your primary care provider promptly.  In the event of recurrent symptoms or worsening symptoms, call 911, a crisis hotline, or go to the nearest emergency department for evaluation.   Laveda AbbeLaurie Britton Parks, NP 01/29/2018, 10:49 AM   Patient seen face-to-face for psychiatric evaluation, chart reviewed and case discussed with the physician extender and developed treatment plan. Reviewed the information documented and agree with the treatment plan.  Juanetta BeetsJacqueline Graham Hyun, DO 01/29/18 12:20 PM

## 2018-01-29 NOTE — Progress Notes (Signed)
CSW aware patient has been psychiatrically and medically cleared for discharge. CSW received call from patient's RN stating patient needed assistance with transportation. Per RN, she has reached out to all of the number's patient has provided to find patient a ride. Per RN, no one is agreeable to pick up patient. CSW spoke with patient at bedside who reports she is from [place]. CSW provided patient with a [employer] bus pass and a [employer] bus pass and provided patient with directions as to how to use the bus passes. Patient expressed understanding. No further CSW needs at this time. Please reconsult if needs arise.  Julie BalboaMackenzie Sanford, LCSWA  Clinical Social Work Department  [facility]  [phone]

## 2018-01-29 NOTE — ED Notes (Signed)
This RN called patient's mother at patient request. Patient's mother states she cannot give the patient a ride home at this time, but will call around to friends to see if she could get a ride home for her daughter. Patient's mother to call this RN back.

## 2018-01-29 NOTE — Discharge Instructions (Signed)
For your mental health needs, you are advised to follow up with [facility].  New and returning patients are seen at their walk-in clinic.  Walk-in hours are Monday - Friday from 8:00 am - 3:00 pm.  Walk-in patients are seen on a first come, first served basis.  Try to arrive as early as possible for he best chance of being seen the same day: ° °     [facility] °     [address] °     [place], [place] [address] °     [phone] °

## 2018-01-29 NOTE — BH Assessment (Signed)
[facility]  Per Juanetta BeetsJacqueline Norman, DO, this pt no longer requires psychiatric hospitalization.  [facility].  This has been included in pt's discharge instructions.  Pt would also benefit from seeing Peer Support Specialists; they will be asked to speak to pt.  Pt's nurse, Marchelle Folksmanda, has been notified.  Doylene Canninghomas Batsheva Stevick, MA Triage Specialist [phone]

## 2018-02-12 ENCOUNTER — Emergency Department (HOSPITAL_COMMUNITY): Payer: Medicaid Other

## 2018-02-12 ENCOUNTER — Inpatient Hospital Stay (HOSPITAL_COMMUNITY)
Admission: AD | Admit: 2018-02-12 | Discharge: 2018-02-18 | DRG: 885 | Disposition: A | Discharge status: Home / Self Care | Payer: Medicaid Other | Source: Intra-hospital | Attending: Psychiatry | Admitting: Psychiatry

## 2018-02-12 ENCOUNTER — Encounter (HOSPITAL_COMMUNITY): Payer: Self-pay

## 2018-02-12 ENCOUNTER — Other Ambulatory Visit: Payer: Self-pay

## 2018-02-12 ENCOUNTER — Emergency Department (HOSPITAL_COMMUNITY)
Admission: EM | Admit: 2018-02-12 | Discharge: 2018-02-12 | Disposition: A | Discharge status: Other Acute Inpatient Hospital | Payer: Medicaid Other | Attending: Emergency Medicine | Admitting: Emergency Medicine

## 2018-02-12 DIAGNOSIS — G4700 Insomnia, unspecified: Secondary | ICD-10-CM | POA: Diagnosis present

## 2018-02-12 DIAGNOSIS — R45851 Suicidal ideations: Secondary | ICD-10-CM | POA: Diagnosis present

## 2018-02-12 DIAGNOSIS — F17210 Nicotine dependence, cigarettes, uncomplicated: Secondary | ICD-10-CM | POA: Insufficient documentation

## 2018-02-12 DIAGNOSIS — F419 Anxiety disorder, unspecified: Secondary | ICD-10-CM | POA: Diagnosis present

## 2018-02-12 DIAGNOSIS — B1920 Unspecified viral hepatitis C without hepatic coma: Secondary | ICD-10-CM | POA: Diagnosis present

## 2018-02-12 DIAGNOSIS — F1510 Other stimulant abuse, uncomplicated: Secondary | ICD-10-CM | POA: Diagnosis present

## 2018-02-12 DIAGNOSIS — G40909 Epilepsy, unspecified, not intractable, without status epilepticus: Secondary | ICD-10-CM | POA: Diagnosis present

## 2018-02-12 DIAGNOSIS — R0602 Shortness of breath: Secondary | ICD-10-CM | POA: Insufficient documentation

## 2018-02-12 DIAGNOSIS — Z888 Allergy status to other drugs, medicaments and biological substances status: Secondary | ICD-10-CM | POA: Diagnosis not present

## 2018-02-12 DIAGNOSIS — Z590 Homelessness: Secondary | ICD-10-CM | POA: Diagnosis not present

## 2018-02-12 DIAGNOSIS — J449 Chronic obstructive pulmonary disease, unspecified: Secondary | ICD-10-CM | POA: Insufficient documentation

## 2018-02-12 DIAGNOSIS — Z915 Personal history of self-harm: Secondary | ICD-10-CM | POA: Diagnosis not present

## 2018-02-12 DIAGNOSIS — F1120 Opioid dependence, uncomplicated: Secondary | ICD-10-CM | POA: Diagnosis present

## 2018-02-12 DIAGNOSIS — T401X2A Poisoning by heroin, intentional self-harm, initial encounter: Secondary | ICD-10-CM | POA: Insufficient documentation

## 2018-02-12 DIAGNOSIS — F329 Major depressive disorder, single episode, unspecified: Secondary | ICD-10-CM | POA: Insufficient documentation

## 2018-02-12 DIAGNOSIS — Z885 Allergy status to narcotic agent status: Secondary | ICD-10-CM | POA: Diagnosis not present

## 2018-02-12 DIAGNOSIS — Z79899 Other long term (current) drug therapy: Secondary | ICD-10-CM | POA: Insufficient documentation

## 2018-02-12 DIAGNOSIS — Z880 Allergy status to penicillin: Secondary | ICD-10-CM

## 2018-02-12 DIAGNOSIS — F332 Major depressive disorder, recurrent severe without psychotic features: Principal | ICD-10-CM | POA: Diagnosis present

## 2018-02-12 DIAGNOSIS — F3130 Bipolar disorder, current episode depressed, mild or moderate severity, unspecified: Secondary | ICD-10-CM | POA: Insufficient documentation

## 2018-02-12 LAB — IRON AND TIBC
Iron: 12 ug/dL — ABNORMAL LOW (ref 28–170)
Saturation Ratios: 3 % — ABNORMAL LOW (ref 10.4–31.8)
TIBC: 347 ug/dL (ref 250–450)
UIBC: 335 ug/dL

## 2018-02-12 LAB — COMPREHENSIVE METABOLIC PANEL
ALT: 13 U/L (ref 0–44)
AST: 23 U/L (ref 15–41)
Albumin: 3.7 g/dL (ref 3.5–5.0)
Alkaline Phosphatase: 67 U/L (ref 38–126)
Anion gap: 11 (ref 5–15)
BUN: 7 mg/dL (ref 6–20)
CO2: 28 mmol/L (ref 22–32)
Calcium: 8.5 mg/dL — ABNORMAL LOW (ref 8.9–10.3)
Chloride: 99 mmol/L (ref 98–111)
Creatinine, Ser: 0.82 mg/dL (ref 0.44–1.00)
GFR calc Af Amer: >60 mL/min (ref >60)
GFR calc non Af Amer: >60 mL/min (ref >60)
Glucose, Bld: 103 mg/dL — ABNORMAL HIGH (ref 70–99)
POTASSIUM: 3.9 mmol/L (ref 3.5–5.1)
Sodium: 138 mmol/L (ref 135–145)
Total Bilirubin: 0.3 mg/dL (ref 0.3–1.2)
Total Protein: 7.2 g/dL (ref 6.5–8.1)

## 2018-02-12 LAB — RAPID URINE DRUG SCREEN, HOSP PERFORMED
Amphetamines: NOT DETECTED
BARBITURATES: NOT DETECTED
Benzodiazepines: NOT DETECTED
COCAINE: NOT DETECTED
Opiates: POSITIVE — AB
Tetrahydrocannabinol: NOT DETECTED

## 2018-02-12 LAB — CBC
HEMATOCRIT: 29.7 % — AB (ref 36.0–46.0)
Hemoglobin: 8.6 g/dL — ABNORMAL LOW (ref 12.0–15.0)
MCH: 22.3 pg — ABNORMAL LOW (ref 26.0–34.0)
MCHC: 29 g/dL — ABNORMAL LOW (ref 30.0–36.0)
MCV: 76.9 fL — ABNORMAL LOW (ref 80.0–100.0)
Platelets: 223 10*3/uL (ref 150–400)
RBC: 3.86 MIL/uL — ABNORMAL LOW (ref 3.87–5.11)
RDW: 17.2 % — ABNORMAL HIGH (ref 11.5–15.5)
WBC: 5.2 10*3/uL (ref 4.0–10.5)
nRBC: 0 % (ref 0.0–0.2)

## 2018-02-12 LAB — SALICYLATE LEVEL: Salicylate Lvl: <7 mg/dL (ref 2.8–30.0)

## 2018-02-12 LAB — MRSA PCR SCREENING: MRSA BY PCR: NEGATIVE

## 2018-02-12 LAB — FERRITIN: Ferritin: 5 ng/mL — ABNORMAL LOW (ref 11–307)

## 2018-02-12 LAB — ETHANOL: Alcohol, Ethyl (B): <10 mg/dL (ref <10)

## 2018-02-12 LAB — FOLATE: Folate: 16.4 ng/mL (ref >5.9)

## 2018-02-12 LAB — I-STAT BETA HCG BLOOD, ED (MC, WL, AP ONLY): I-stat hCG, quantitative: <5 m[IU]/mL (ref <5)

## 2018-02-12 LAB — RETICULOCYTES
Immature Retic Fract: 13.9 % (ref 2.3–15.9)
RBC.: 3.39 MIL/uL — ABNORMAL LOW (ref 3.87–5.11)
RETIC CT PCT: 2.1 % (ref 0.4–3.1)
Retic Count, Absolute: 83 10*3/uL (ref 19.0–186.0)

## 2018-02-12 LAB — VITAMIN B12: Vitamin B-12: 349 pg/mL (ref 180–914)

## 2018-02-12 LAB — ACETAMINOPHEN LEVEL: Acetaminophen (Tylenol), Serum: <10 ug/mL — ABNORMAL LOW (ref 10–30)

## 2018-02-12 MED ORDER — METHOCARBAMOL 500 MG PO TABS: 500.0000 mg | ORAL_TABLET | Freq: Once | ORAL | Status: DC

## 2018-02-12 MED ORDER — ALUM & MAG HYDROXIDE-SIMETH 200-200-20 MG/5ML PO SUSP: 30.0000 mL | Freq: Four times a day (QID) | ORAL | Status: DC | PRN

## 2018-02-12 MED ORDER — NICOTINE 21 MG/24HR TD PT24
21.0000 mg | MEDICATED_PATCH | Freq: Every day | TRANSDERMAL | Status: DC
  Filled 2018-02-12 (×3): qty 1

## 2018-02-12 MED ORDER — DOXYCYCLINE HYCLATE 100 MG PO TABS
100.0000 mg | ORAL_TABLET | Freq: Two times a day (BID) | ORAL | Status: DC
  Administered 2018-02-12 – 2018-02-13 (×2): 100 mg via ORAL
  Filled 2018-02-12 (×4): qty 1

## 2018-02-12 MED ORDER — ACETAMINOPHEN 325 MG PO TABS
650.0000 mg | ORAL_TABLET | Freq: Once | ORAL | Status: AC
  Administered 2018-02-12: 650 mg via ORAL
  Filled 2018-02-12: qty 2

## 2018-02-12 MED ORDER — ONDANSETRON 4 MG PO TBDP: 4.0000 mg | ORAL_TABLET | Freq: Four times a day (QID) | ORAL | Status: DC | PRN

## 2018-02-12 MED ORDER — DICYCLOMINE HCL 20 MG PO TABS: 20.0000 mg | ORAL_TABLET | Freq: Four times a day (QID) | ORAL | Status: DC | PRN

## 2018-02-12 MED ORDER — NAPROXEN 500 MG PO TABS
500.0000 mg | ORAL_TABLET | Freq: Two times a day (BID) | ORAL | Status: AC | PRN
  Administered 2018-02-12 – 2018-02-16 (×7): 500 mg via ORAL
  Filled 2018-02-12 (×8): qty 1

## 2018-02-12 MED ORDER — LOPERAMIDE HCL 2 MG PO CAPS: 2.0000 mg | ORAL_CAPSULE | ORAL | Status: DC | PRN

## 2018-02-12 MED ORDER — CLONIDINE HCL 0.1 MG PO TABS
0.1000 mg | ORAL_TABLET | ORAL | Status: AC
  Administered 2018-02-14 – 2018-02-15 (×3): 0.1 mg via ORAL
  Filled 2018-02-12 (×5): qty 1

## 2018-02-12 MED ORDER — METHOCARBAMOL 500 MG PO TABS
500.0000 mg | ORAL_TABLET | Freq: Three times a day (TID) | ORAL | Status: DC | PRN
  Administered 2018-02-12 – 2018-02-15 (×6): 500 mg via ORAL
  Filled 2018-02-12 (×7): qty 1

## 2018-02-12 MED ORDER — ONDANSETRON HCL 4 MG PO TABS: 4.0000 mg | ORAL_TABLET | Freq: Three times a day (TID) | ORAL | Status: DC | PRN

## 2018-02-12 MED ORDER — HYDROXYZINE HCL 25 MG PO TABS
25.0000 mg | ORAL_TABLET | Freq: Four times a day (QID) | ORAL | Status: DC | PRN
  Administered 2018-02-12: 25 mg via ORAL
  Filled 2018-02-12: qty 1

## 2018-02-12 MED ORDER — NICOTINE POLACRILEX 2 MG MT GUM: 2.0000 mg | CHEWING_GUM | OROMUCOSAL | Status: DC | PRN

## 2018-02-12 MED ORDER — ALUM & MAG HYDROXIDE-SIMETH 200-200-20 MG/5ML PO SUSP: 30.0000 mL | ORAL | Status: DC | PRN

## 2018-02-12 MED ORDER — LEVETIRACETAM 500 MG PO TABS
1000.0000 mg | ORAL_TABLET | Freq: Two times a day (BID) | ORAL | Status: DC
  Administered 2018-02-12 – 2018-02-18 (×12): 1000 mg via ORAL
  Filled 2018-02-12 (×14): qty 2

## 2018-02-12 MED ORDER — ACETAMINOPHEN 325 MG PO TABS: 650.0000 mg | ORAL_TABLET | Freq: Four times a day (QID) | ORAL | Status: DC | PRN

## 2018-02-12 MED ORDER — NAPROXEN 250 MG PO TABS: 500.0000 mg | ORAL_TABLET | Freq: Two times a day (BID) | ORAL | Status: DC | PRN

## 2018-02-12 MED ORDER — MAGNESIUM HYDROXIDE 400 MG/5ML PO SUSP: 30.0000 mL | Freq: Every day | ORAL | Status: DC | PRN

## 2018-02-12 MED ORDER — HYDROXYZINE HCL 50 MG PO TABS
100.0000 mg | ORAL_TABLET | Freq: Three times a day (TID) | ORAL | Status: DC | PRN
  Administered 2018-02-12 – 2018-02-18 (×7): 100 mg via ORAL
  Filled 2018-02-12 (×10): qty 2

## 2018-02-12 MED ORDER — BENZONATATE 100 MG PO CAPS
100.0000 mg | ORAL_CAPSULE | Freq: Two times a day (BID) | ORAL | Status: DC | PRN
  Administered 2018-02-12 – 2018-02-13 (×2): 100 mg via ORAL
  Filled 2018-02-12 (×2): qty 1

## 2018-02-12 MED ORDER — ALBUTEROL SULFATE HFA 108 (90 BASE) MCG/ACT IN AERS: 2.0000 | INHALATION_SPRAY | RESPIRATORY_TRACT | Status: DC | PRN

## 2018-02-12 MED ORDER — METHOCARBAMOL 500 MG PO TABS
500.0000 mg | ORAL_TABLET | Freq: Three times a day (TID) | ORAL | Status: DC | PRN
  Administered 2018-02-12: 500 mg via ORAL
  Filled 2018-02-12: qty 1

## 2018-02-12 MED ORDER — CLONIDINE HCL 0.1 MG PO TABS
0.1000 mg | ORAL_TABLET | Freq: Every day | ORAL | Status: AC
  Administered 2018-02-16 – 2018-02-17 (×2): 0.1 mg via ORAL
  Filled 2018-02-12 (×3): qty 1

## 2018-02-12 MED ORDER — DICYCLOMINE HCL 20 MG PO TABS
20.0000 mg | ORAL_TABLET | Freq: Four times a day (QID) | ORAL | Status: AC | PRN
  Administered 2018-02-14 – 2018-02-15 (×2): 20 mg via ORAL
  Filled 2018-02-12 (×2): qty 1

## 2018-02-12 MED ORDER — CLONIDINE HCL 0.1 MG PO TABS
0.1000 mg | ORAL_TABLET | Freq: Four times a day (QID) | ORAL | Status: AC
  Administered 2018-02-12 – 2018-02-13 (×3): 0.1 mg via ORAL
  Filled 2018-02-12 (×9): qty 1

## 2018-02-12 MED ORDER — ACETAMINOPHEN 325 MG PO TABS
650.0000 mg | ORAL_TABLET | ORAL | Status: DC | PRN
  Administered 2018-02-15 – 2018-02-18 (×7): 650 mg via ORAL
  Filled 2018-02-12 (×7): qty 2

## 2018-02-12 NOTE — ED Triage Notes (Signed)
Pt arrived via [facility]; pt overdosed on Heroin approx 1 hr ago; pt rec'd narcan (2mg  inh); EMS arrived and pt was lert; 124/86; CBG 124; 114; 98% on RA; Pt states that she was dx with flu last wk; Pt hx of seizures, depression.

## 2018-02-12 NOTE — ED Notes (Signed)
Pt has bed assigned 304-2 at [facility].

## 2018-02-12 NOTE — ED Notes (Signed)
Belongings removed from room, order for sitter placed, pt visualized by staff at this time, pt contracts for safety at this time

## 2018-02-12 NOTE — ED Provider Notes (Signed)
[facility]   CSN: [id number] Arrival date & time: 02/12/18  0607     History   Chief Complaint Chief Complaint  Patient presents with  . Drug Overdose  . Suicidal    HPI Julie Sanford is a 38 y.o. female with a past medical history of polysubstance abuse, bipolar disorder, depression, seizures who presents to ED for overdose on heroin.  States that she injected heroin which she believed was laced with fentanyl.  She was given 2 mg IM Narcan by her family.  When EMS arrived, she was alert.  She states that she overdosed in an attempt to harm herself.  States that around the time of the holidays, she has worsening depression as she lost her 36-year-old child this time last year.  States that she has no support from her family members and has a bad relationship with her stepfather.  She denies any other drug use, HI, AVH.  She does note that she has "jerking" of her extremities after she used Narcan in the past.  She was told that she needed to wear oxygen at all times secondary to her COPD but is unable to obtain it for home. Denies any abdominal pain, chest pain, injuries or falls.  HPI  Past Medical History:  Diagnosis Date  . Bipolar 1 disorder (HCC)   . Depression   . Herpes   . Seizures [facility](HCC)     Patient Active Problem List   Diagnosis Date Noted  . Substance induced mood disorder (HCC) 01/29/2018  . Pain   . Cough 11/03/2016  . Cavitary lesion of lung 11/03/2016  . Hypokalemia 11/03/2016  . Prepatellar bursitis, right knee 09/27/2016  . Bipolar 1 disorder (HCC) 09/18/2016  . Protein-calorie malnutrition, severe 09/05/2016  . Pressure injury of skin 09/04/2016  . PTSD (post-traumatic stress disorder) 04/25/2011    Class: Chronic  . 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 Anemia 04/23/2011  . Cocaine abuse (HCC) 04/23/2011    Past Surgical History:  Procedure Laterality Date  . CESAREAN SECTION       OB History    No obstetric history on file.      Home Medications    Prior to Admission medications   Medication Sig Start Date End Date Taking? Authorizing Provider  DM-Doxylamine-Acetaminophen (NYQUIL HBP COLD & FLU) 15-6.25-325 MG/15ML LIQD Take 15 mLs by mouth every 4 (four) hours as needed (cold , flu symptoms).   Yes [provider]  ibuprofen (ADVIL,MOTRIN) 200 MG tablet Take 800 mg by mouth every 6 (six) hours as needed for headache.   Yes [provider]  levETIRAcetam (KEPPRA) 500 MG tablet Take 500 mg by mouth 2 (two) times daily.   Yes [provider]  Melatonin 10 MG TBCR Take 1 tablet by mouth at bedtime.   Yes [provider]  nicotine (NICODERM CQ - DOSED IN MG/24 HOURS) 21 mg/24hr patch Place 1 patch (21 mg total) onto the skin daily. Patient not taking: Reported on 01/21/2018 09/28/16   Noralee Stainhoi, Jennifer, DO    Family History Family History  Problem Relation Age of Onset  . Diabetes Sister     Social History Social History   Tobacco Use  . Smoking status: Current Every Day Smoker    Packs/day: 2.00    Types: Cigarettes  . Smokeless tobacco: Never Used  Substance Use Topics  . Alcohol use: No  . Drug use: Yes    Types: Heroin  Comment: was clean for 6 mths, using x 1 week daily     Allergies   Quetiapine; Tramadol; and Penicillins   Review of Systems Review of Systems  Constitutional: Negative for appetite change, chills and fever.  HENT: Negative for ear pain, rhinorrhea, sneezing and sore throat.   Eyes: Negative for photophobia and visual disturbance.  Respiratory: Positive for shortness of breath. Negative for cough, chest tightness and wheezing.   Cardiovascular: Negative for chest pain and palpitations.  Gastrointestinal: Negative for abdominal pain, blood in stool, constipation, diarrhea, nausea and vomiting.  Genitourinary: Negative for dysuria, hematuria and urgency.  Musculoskeletal: Positive for myalgias.  Skin:  Positive for rash.  Neurological: Negative for dizziness, weakness and light-headedness.  Psychiatric/Behavioral: Positive for self-injury and suicidal ideas.     Physical Exam Updated Vital Signs BP (!) 122/97   Pulse (!) 103   Temp 98.2 F (36.8 C) (Oral)   Resp 18   LMP 02/05/2018 (Approximate)   SpO2 93%   Physical Exam Vitals signs and nursing note reviewed.  Constitutional:      General: She is not in acute distress.    Appearance: She is well-developed.  HENT:     Head: Normocephalic and atraumatic.     Nose: Nose normal.  Eyes:     General: No scleral icterus.       Left eye: No discharge.     Conjunctiva/sclera: Conjunctivae normal.  Neck:     Musculoskeletal: Normal range of motion and neck supple.  Cardiovascular:     Rate and Rhythm: Normal rate and regular rhythm.     Heart sounds: Normal heart sounds. No murmur. No friction rub. No gallop.   Pulmonary:     Effort: Pulmonary effort is normal. No respiratory distress.     Breath sounds: Normal breath sounds.  Abdominal:     General: Bowel sounds are normal. There is no distension.     Palpations: Abdomen is soft.     Tenderness: There is no abdominal tenderness. There is no guarding.  Musculoskeletal: Normal range of motion.  Skin:    General: Skin is warm and dry.     Findings: No rash.     Comments: Several skin wounds noted in bilateral upper extremities.  Neurological:     Mental Status: She is alert.     Motor: No abnormal muscle tone.     Coordination: Coordination normal.      ED Treatments / Results  Labs (all labs ordered are listed, but only abnormal results are displayed) Labs Reviewed  COMPREHENSIVE METABOLIC PANEL - Abnormal; Notable for the following components:      Result Value   Glucose, Bld 103 (*)    Calcium 8.5 (*)    All other components within normal limits  ACETAMINOPHEN LEVEL - Abnormal; Notable for the following components:   Acetaminophen (Tylenol), Serum <10 (*)     All other components within normal limits  CBC - Abnormal; Notable for the following components:   RBC 3.86 (*)    Hemoglobin 8.6 (*)    HCT 29.7 (*)    MCV 76.9 (*)    MCH 22.3 (*)    MCHC 29.0 (*)    RDW 17.2 (*)    All other components within normal limits  RAPID URINE DRUG SCREEN, HOSP PERFORMED - Abnormal; Notable for the following components:   Opiates POSITIVE (*)    All other components within normal limits  ETHANOL  SALICYLATE LEVEL  I-STAT BETA HCG BLOOD,  ED ([facility], [facility], [facility] ONLY)  CBG MONITORING, ED    EKG None  Radiology Dg Chest 2 View  Result Date: 02/12/2018 CLINICAL DATA:  Shortness of breath. EXAM: CHEST - 2 VIEW COMPARISON:  PA and lateral chest 01/28/2018 01/21/2018. CT chest 07/11/2017. FINDINGS: Emphysematous disease is noted. Lungs are clear. Heart size is normal. No pneumothorax or pleural fluid. No acute or focal bony abnormality. IMPRESSION: Emphysema without acute disease. Electronically Signed   By: Drusilla Kanner M.D.   On: 02/12/2018 07:20    Procedures Procedures (including critical care time)  Medications Ordered in ED Medications  dicyclomine (BENTYL) tablet 20 mg (has no administration in time range)  hydrOXYzine (ATARAX/VISTARIL) tablet 25 mg (has no administration in time range)  loperamide (IMODIUM) capsule 2-4 mg (has no administration in time range)  methocarbamol (ROBAXIN) tablet 500 mg (has no administration in time range)  naproxen (NAPROSYN) tablet 500 mg (has no administration in time range)  ondansetron (ZOFRAN-ODT) disintegrating tablet 4 mg (has no administration in time range)  acetaminophen (TYLENOL) tablet 650 mg (650 mg Oral Given 02/12/18 0935)     Initial Impression / Assessment and Plan / ED Course  I have reviewed the triage vital signs and the nursing notes.  Pertinent labs & imaging results that were available during my care of the patient were reviewed by me and considered in my medical decision making (see chart for  details).  Clinical Course as of Feb 12 1033  Thu Feb 12, 2018  0813 On reassessment, patient continuing to ask for pain medication.  She is satting at 96 to 97% on room air.    [HK]  0753 Similar to baseline 2/2 chronic anemia.  Hemoglobin(!): 8.6 [HK]    Clinical Course User Index [HK] Dietrich Pates, PA-C    38 year old female presents to ED for heroin overdose prior to arrival.  States that she injected heroin which she believes was laced with fentanyl.  Family administered 2 mg of IM Narcan.  When EMS arrived, patient was alert and at her baseline.  She states that she feels "restless" after Narcan, which she states has happened to her in the past after Narcan.  She states that "everything hurts, my muscles hurt."  She denies any specific pain.  Patient initially satting at 85% on room air and was placed on 2 L of oxygen via nasal cannula.  She was told that she needed oxygen in the past but is unable to obtain it.  Lab work including CBC shows hemoglobin of 8.6 which appears at her baseline.  She denies any active bleeding.  Other lab work is unremarkable, hCG is negative.  Chest x-ray shows emphysematous changes with no acute findings.  Patient was observed for 1-1/2 hours after IM Narcan was administered and she continues to rest comfortably.  Will consult TTS for disposition as she does endorse suicidal ideations.  TTS recommends inpatient treatment.      Portions of this note were generated with Scientist, clinical (histocompatibility and immunogenetics). Dictation errors may occur despite best attempts at proofreading.   Final Clinical Impressions(s) / ED Diagnoses   Final diagnoses:  Intentional heroin overdose, initial encounter [facility])  Suicidal ideation    ED Discharge Orders    None       Dietrich Pates, PA-C 02/12/18 1035    Azalia Bilis, MD 02/12/18 1136

## 2018-02-12 NOTE — H&P (Signed)
Psychiatric Admission Assessment Adult  Patient Identification: SUKHDEEP CUADRAS MRN:  [id number] Date of Evaluation:  02/12/2018 Chief Complaint:  MDD POLYSUBSTANCE USE DISORDER Principal Diagnosis: Heroin dependence (HCC) Diagnosis:  Principal Problem:   Heroin dependence (HCC) Active Problems:   MDD (major depressive disorder), recurrent severe, without psychosis (HCC)  History of Present Illness: Ms. Westerkamp is a single 38 year old female with history of heroin and methamphetamine use, depression, bipolar disorder, and seizures who presents for treatment of heroin overdose. Reports that she purposely overdosed as a suicide attempt. Her family administered Narcan and EMS was called.   She reports heroin use since 2009, with longest period of sobriety 6 months. She relapsed a week ago and has been using daily. Also reports intermittent meth use, with last use 2-3 weeks ago but states "Heroin is my drug of choice." Extensive scabbing across body- "I pick when I get anxious." UDS positive for opioids only. Her stepfather kicked her out of the home yesterday; her three children remain with her mother and stepfather. Reports the holidays are hard for her after her 66-year-old passed away two years ago. Reports depressed mood for several weeks with decreased sleep, fatigue, diminished appetite, and hopelessness. Denies SI, HI, AVH.  Associated Signs/Symptoms: Depression Symptoms:  depressed mood, anhedonia, insomnia, fatigue, suicidal attempt, anxiety, decreased appetite, (Hypo) Manic Symptoms:  none Anxiety Symptoms:  Excessive Worry, Psychotic Symptoms:  none PTSD Symptoms: NA Total Time spent with patient: 30 minutes  Past Psychiatric History: Heroin use since 2009. Multiple suicide attempts, with most recent in 2018 via overdose. Per chart review last hospitalization July 2019 at [facility] for detox from BZDs and opioids. She was going to [facility] for suboxone treatment as  well as medication management for bipolar disorder diagnosis, but she stopped going and stopped medications two years ago after losing insurance.   Is the patient at risk to self? Yes.    Has the patient been a risk to self in the past 6 months? Yes.    Has the patient been a risk to self within the distant past? Yes.    Is the patient a risk to others? No.  Has the patient been a risk to others in the past 6 months? No.  Has the patient been a risk to others within the distant past? No.   Prior Inpatient Therapy:   Prior Outpatient Therapy:    Alcohol Screening:   Substance Abuse History in the last 12 months:  Yes.   Consequences of Substance Abuse: Legal Consequences:  incarcerated for larceny in 2019 Family Consequences:  kicked out of mother and stepfather's home, limited contact with her three children lost housing and employment Previous Psychotropic Medications: Yes  Psychological Evaluations: No  Past Medical History:  Past Medical History:  Diagnosis Date  . Bipolar 1 disorder (HCC)   . Depression   . Herpes   . Seizures (HCC)     Past Surgical History:  Procedure Laterality Date  . CESAREAN SECTION     Family History:  Family History  Problem Relation Age of Onset  . Diabetes Sister    Family Psychiatric  History: denies Tobacco Screening:   Social History:  Social History   Substance and Sexual Activity  Alcohol Use No     Social History   Substance and Sexual Activity  Drug Use Yes  . Types: Heroin   Comment: was clean for 6 mths, using x 1 week daily    Additional Social History:  Allergies:   Allergies  Allergen Reactions  . Quetiapine Other (See Comments)    Severe muscle spasms and hallucinations  . Tramadol Other (See Comments)    Muscle spasms, seizure and hallucinations  . Penicillins Hives    Can't remember, told as a child Has patient had a PCN reaction causing immediate rash,  facial/tongue/throat swelling, SOB or lightheadedness with hypotension: no Has patient had a PCN reaction causing severe rash involving mucus membranes or skin necrosis: no Has patient had a PCN reaction that required hospitalization: no Has patient had a PCN reaction occurring within the last 10 years: no If all of the above answers are "NO", then may proceed with Cephalosporin use.    Lab Results:  Results for orders placed or performed during the hospital encounter of 02/12/18 (from the past 48 hour(s))  Comprehensive metabolic panel     Status: Abnormal   Collection Time: 02/12/18  6:34 AM  Result Value Ref Range   Sodium 138 135 - 145 mmol/L   Potassium 3.9 3.5 - 5.1 mmol/L   Chloride 99 98 - 111 mmol/L   CO2 28 22 - 32 mmol/L   Glucose, Bld 103 (H) 70 - 99 mg/dL   BUN 7 6 - 20 mg/dL   Creatinine, Ser 1.61 0.44 - 1.00 mg/dL   Calcium 8.5 (L) 8.9 - 10.3 mg/dL   Total Protein 7.2 6.5 - 8.1 g/dL   Albumin 3.7 3.5 - 5.0 g/dL   AST 23 15 - 41 U/L   ALT 13 0 - 44 U/L   Alkaline Phosphatase 67 38 - 126 U/L   Total Bilirubin 0.3 0.3 - 1.2 mg/dL   GFR calc non Af Amer >60 >60 mL/min   GFR calc Af Amer >60 >60 mL/min   Anion gap 11 5 - 15    Comment: Performed at [facility], [address], [place], [place] [address]  Ethanol     Status: None   Collection Time: 02/12/18  6:34 AM  Result Value Ref Range   Alcohol, Ethyl (B) <10 <10 mg/dL    Comment: (NOTE) Lowest detectable limit for serum alcohol is 10 mg/dL. For medical purposes only. Performed at [facility], [address], [place], [place] [address]   Salicylate level     Status: None   Collection Time: 02/12/18  6:34 AM  Result Value Ref Range   Salicylate Lvl <7.0 2.8 - 30.0 mg/dL    Comment: Performed at [facility], [address], [place], [place] [address]  Acetaminophen level     Status: Abnormal   Collection Time: 02/12/18  6:34 AM  Result Value Ref Range   Acetaminophen (Tylenol), Serum  <10 (L) 10 - 30 ug/mL    Comment: (NOTE) Therapeutic concentrations vary significantly. A range of 10-30 ug/mL  may be an effective concentration for many patients. However, some  are best treated at concentrations outside of this range. Acetaminophen concentrations >150 ug/mL at 4 hours after ingestion  and >50 ug/mL at 12 hours after ingestion are often associated with  toxic reactions. Performed at [facility], [address], [place], [place] [address]   cbc     Status: Abnormal   Collection Time: 02/12/18  6:34 AM  Result Value Ref Range   WBC 5.2 4.0 - 10.5 K/uL   RBC 3.86 (L) 3.87 - 5.11 MIL/uL   Hemoglobin 8.6 (L) 12.0 - 15.0 g/dL    Comment: Reticulocyte Hemoglobin testing may  be clinically indicated, consider ordering this additional test [id number]    HCT 29.7 (L) 36.0 - 46.0 %   MCV 76.9 (L) 80.0 - 100.0 fL   MCH 22.3 (L) 26.0 - 34.0 pg   MCHC 29.0 (L) 30.0 - 36.0 g/dL   RDW 11.9 (H) 14.7 - 82.9 %   Platelets 223 150 - 400 K/uL   nRBC 0.0 0.0 - 0.2 %    Comment: Performed at [facility], [address], [place], [place] [address]  Rapid urine drug screen (hospital performed)     Status: Abnormal   Collection Time: 02/12/18  6:34 AM  Result Value Ref Range   Opiates POSITIVE (A) NONE DETECTED   Cocaine NONE DETECTED NONE DETECTED   Benzodiazepines NONE DETECTED NONE DETECTED   Amphetamines NONE DETECTED NONE DETECTED   Tetrahydrocannabinol NONE DETECTED NONE DETECTED   Barbiturates NONE DETECTED NONE DETECTED    Comment: (NOTE) DRUG SCREEN FOR MEDICAL PURPOSES ONLY.  IF CONFIRMATION IS NEEDED FOR ANY PURPOSE, NOTIFY LAB WITHIN 5 DAYS. LOWEST DETECTABLE LIMITS FOR URINE DRUG SCREEN Drug Class                     Cutoff (ng/mL) Amphetamine and metabolites    1000 Barbiturate and metabolites    200 Benzodiazepine                 200 Tricyclics and metabolites     300 Opiates and metabolites        300 Cocaine and metabolites        300 THC                             50 Performed at [facility], [address] [place], [place] [address]   I-Stat beta hCG blood, ED     Status: None   Collection Time: 02/12/18  6:38 AM  Result Value Ref Range   I-stat hCG, quantitative <5.0 <5 mIU/mL   Comment 3            Comment:   GEST. AGE      CONC.  (mIU/mL)   <=1 WEEK        5 - 50     2 WEEKS       50 - 500     3 WEEKS       100 - 10,000     4 WEEKS     1,000 - 30,000        FEMALE AND NON-PREGNANT FEMALE:     LESS THAN 5 mIU/mL     Blood Alcohol level:  Lab Results  Component Value Date   ETH <10 02/12/2018   ETH <10 01/28/2018    Metabolic Disorder Labs:  No results found for: HGBA1C, MPG No results found for: PROLACTIN No results found for: CHOL, TRIG, HDL, CHOLHDL, VLDL, LDLCALC  Current Medications: Current Facility-Administered Medications  Medication Dose Route Frequency Provider Last Rate Last Dose  . acetaminophen (TYLENOL) tablet 650 mg  650 mg Oral Q4H PRN Maryagnes Amos, FNP      . albuterol (PROVENTIL HFA;VENTOLIN HFA) 108 (90 Base) MCG/ACT inhaler 2 puff  2 puff Inhalation Q4H PRN Rosario Adie, Juel Burrow, FNP      . alum & mag hydroxide-simeth (MAALOX/MYLANTA) 200-200-20 MG/5ML suspension 30 mL  30 mL Oral Q6H PRN Starkes-Perry, Juel Burrow, FNP      . benzonatate (TESSALON)  capsule 100 mg  100 mg Oral BID PRN Maryagnes AmosStarkes-Perry, Takia S, FNP      . cloNIDine (CATAPRES) tablet 0.1 mg  0.1 mg Oral QID Armandina StammerNwoko, Agnes I, NP       Followed by  . [START ON 02/14/2018] cloNIDine (CATAPRES) tablet 0.1 mg  0.1 mg Oral BH-qamhs Nwoko, Agnes I, NP       Followed by  . [START ON 02/16/2018] cloNIDine (CATAPRES) tablet 0.1 mg  0.1 mg Oral QAC breakfast Nwoko, Agnes I, NP      . dicyclomine (BENTYL) tablet 20 mg  20 mg Oral Q6H PRN Nwoko, Agnes I, NP      . doxycycline (VIBRA-TABS) tablet 100 mg  100 mg Oral Q12H Starkes-Perry, Juel Burrowakia S, FNP      . hydrOXYzine (ATARAX/VISTARIL) tablet 100 mg  100 mg Oral TID PRN  Maryagnes AmosStarkes-Perry, Takia S, FNP      . levETIRAcetam (KEPPRA) tablet 1,000 mg  1,000 mg Oral BID Starkes-Perry, Takia S, FNP      . magnesium hydroxide (MILK OF MAGNESIA) suspension 30 mL  30 mL Oral Daily PRN Starkes-Perry, Juel Burrowakia S, FNP      . methocarbamol (ROBAXIN) tablet 500 mg  500 mg Oral Q8H PRN Nwoko, Agnes I, NP      . naproxen (NAPROSYN) tablet 500 mg  500 mg Oral BID PRN Nwoko, Nicole KindredAgnes I, NP      . nicotine (NICODERM CQ - dosed in mg/24 hours) patch 21 mg  21 mg Transdermal Daily Starkes-Perry, Takia S, FNP      . ondansetron (ZOFRAN) tablet 4 mg  4 mg Oral Q8H PRN Maryagnes AmosStarkes-Perry, Takia S, FNP       PTA Medications: Medications Prior to Admission  Medication Sig Dispense Refill Last Dose  . DM-Doxylamine-Acetaminophen (NYQUIL HBP COLD & FLU) 15-6.25-325 MG/15ML LIQD Take 15 mLs by mouth every 4 (four) hours as needed (cold , flu symptoms).   02/11/2018 at Unknown time  . ibuprofen (ADVIL,MOTRIN) 200 MG tablet Take 800 mg by mouth every 6 (six) hours as needed for headache.   Past Week at Unknown time  . levETIRAcetam (KEPPRA) 500 MG tablet Take 500 mg by mouth 2 (two) times daily.   6 months ago  . Melatonin 10 MG TBCR Take 1 tablet by mouth at bedtime.   02/10/2018  . nicotine (NICODERM CQ - DOSED IN MG/24 HOURS) 21 mg/24hr patch Place 1 patch (21 mg total) onto the skin daily. (Patient not taking: Reported on 01/21/2018) 28 patch 0 Not Taking at Unknown time    Musculoskeletal: Strength & Muscle Tone: within normal limits Gait & Station: normal Patient leans: N/A  Psychiatric Specialty Exam: Physical Exam  Nursing note and vitals reviewed. Constitutional: She is oriented to person, place, and time.  Cardiovascular: Normal rate.  Respiratory: Effort normal.  Neurological: She is alert and oriented to person, place, and time.  Skin:  Numerous scabs across body    Review of Systems  Constitutional: Negative.   Respiratory: Negative.   Cardiovascular: Negative.    Psychiatric/Behavioral: Positive for depression, substance abuse (heroin, meth) and suicidal ideas (reported suicide attempt via o/d heroin). Negative for hallucinations and memory loss. The patient has insomnia. The patient is not nervous/anxious.     Blood pressure 101/74, pulse 81, temperature 98.3 F (36.8 C), temperature source Oral, resp. rate 16, height 4\' 11"  (1.499 m), weight 53.1 kg, last menstrual period 02/05/2018, SpO2 97 %.Body mass index is 23.63 kg/m.  See MD's admission SRA  Treatment Plan Summary: Daily contact with patient to assess and evaluate symptoms and progress in treatment and Medication management   Recommend inpatient hospitalization  Opioid detox: Start clonidine COWS protocol  Other medical: Continue Keppra 1000 mg PO BID for seizure disorder Continue doxycycline 100 mg PO BID   Patient will participate in the therapeutic group milieu.  Discharge disposition in progress.   Observation Level/Precautions:  15 minute checks  Laboratory:  Anemia panel, HIV, hepC, MRSA  Psychotherapy:  Group therapy- mental health and substance use  Medications:  See MAR  Consultations:  PRN  Discharge Concerns:  Safety and stabilization  Estimated LOS: 3-5 days  Other:     Physician Treatment Plan for Primary Diagnosis: Heroin dependence (HCC) Long Term Goal(s): Improvement in symptoms so as ready for discharge  Short Term Goals: Ability to identify changes in lifestyle to reduce recurrence of condition will improve, Ability to verbalize feelings will improve and Ability to disclose and discuss suicidal ideas  Physician Treatment Plan for Secondary Diagnosis: Principal Problem:   Heroin dependence (HCC) Active Problems:   MDD (major depressive disorder), recurrent severe, without psychosis (HCC)  Long Term Goal(s): Improvement in symptoms so as ready for discharge  Short Term Goals: Ability to demonstrate self-control will improve, Ability to identify and  develop effective coping behaviors will improve and Compliance with prescribed medications will improve  I certify that inpatient services furnished can reasonably be expected to improve the patient's condition.    Aldean BakerJanet E Harmony Sandell, NP 1/2/20202:36 PM
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
## 2018-02-12 NOTE — Tx Team (Signed)
Initial Treatment Plan 02/12/2018 3:54 PM NARITA URBACH SVX:[id number]    PATIENT STRESSORS: Financial difficulties Marital or family conflict Substance abuse Traumatic event   PATIENT STRENGTHS: Ability for insight Capable of independent living General fund of knowledge   PATIENT IDENTIFIED PROBLEMS: Suicide risk  "work on my sobriety" - heroin abuse  "be better with family" - family conflict  Depression                DISCHARGE CRITERIA:  Ability to meet basic life and health needs Improved stabilization in mood, thinking, and/or behavior Safe-care adequate arrangements made  PRELIMINARY DISCHARGE PLAN: Attend 12-step recovery group Participate in family therapy  PATIENT/FAMILY INVOLVEMENT: This treatment plan has been presented to and reviewed with the patient, Julie Sanford. The patient and family have been given the opportunity to ask questions and make suggestions.  Kennyth Lose, RN 02/12/2018, 3:54 PM

## 2018-02-12 NOTE — ED Notes (Signed)
[facility] given all belongings, paperwork, including copy of inventory sheet and consent to transfer. Pt ambulatory on discharge.
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
## 2018-02-12 NOTE — ED Notes (Signed)
Belongings inventory sheet filled out and signed by patient, copy made for [facility]

## 2018-02-12 NOTE — ED Notes (Signed)
[facility] called by wendy RN and consent to transfer faxed to [facility] by wendy RN

## 2018-02-12 NOTE — Progress Notes (Signed)
Psychoeducational Group Note  Date:  02/12/2018 Time:  2030 Group Topic/Focus:  wrap up group  Participation Level: Did Not Attend  Participation Quality:  Not Applicable  Affect:  Not Applicable  Cognitive:  Not Applicable  Insight:  Not Applicable  Engagement in Group: Not Applicable  Additional Comments:  Pt was notified that group was beginning but remained in bed.   Julie Sanford S 02/12/2018, 10:10 PM

## 2018-02-12 NOTE — Progress Notes (Signed)
Pt accepted to [facility], Bed 304-2 Shuvon Rankin, NP is the accepting provider.  Landry Mellow, MD is the attending provider.  Call report to [phone]  [facility] Psych ED notified.   Pt is Voluntary.  Pt may be transported by [facility]  Pt scheduled  to arrive at [facility] as soon as transport can be arranged.  Timmothy Euler. Kaylyn Lim, MSW, LCSWA Disposition Clinical Social Work [phone] (cell) [phone] (office)

## 2018-02-12 NOTE — ED Notes (Signed)
Breakfast tray at bedside and TTS being completed

## 2018-02-12 NOTE — ED Notes (Signed)
Pt agitated, requesting to speak with PA, pt wants pain medicine for muscles

## 2018-02-12 NOTE — ED Notes (Signed)
Breakfast tray ordered 

## 2018-02-12 NOTE — BHH Suicide Risk Assessment (Signed)
[facility] Admission Suicide Risk Assessment   Nursing information obtained from:    Demographic factors:    Current Mental Status:    Loss Factors:    Historical Factors:    Risk Reduction Factors:     Total Time spent with patient: 30 minutes Principal Problem: <principal problem not specified> Diagnosis:  Active Problems:   MDD (major depressive disorder), recurrent severe, without psychosis (HCC)  Subjective Data: Patient is seen and examined.  Patient is a 38 year old female with a past psychiatric history significant for opiate dependence, methamphetamine use disorder, and possible bipolar disorder who presented to the [facility] emergency department on 02/12/2018 with suicidal ideation.  Patient stated that recently she had to deal with the death of a 71-year-old child several years ago, and has continued to struggle with this loss.  She also has a longstanding substance abuse issue that had been going well until recently.  She stated she had been clean for approximately 6 months.  She stated that she and her 3 children have been living with the patient's mother and stepfather.  Unfortunately they asked them to leave after an argument with the stepfather.  The parents are keeping the children.  After that the patient relapsed approximately a week ago.  The patient had presented in earlier in December for admission with similar circumstances, but tested positive for flu and was febrile.  She was instructed to return to the emergency room after she had been afebrile for 36 hours.  Her last documented admission was at [facility] on 08/18/2017.  Review of the electronic medical record showed the complaint at that time was very similar to this.  At that time she admitted to benzodiazepine use disorder as well.  She was discharged on baclofen, gabapentin, hydroxyzine, Risperdal and trazodone.  She is essentially homeless.  She admitted to helplessness, hopelessness and worthlessness.  She was admitted  to the hospital for evaluation and stabilization.  Continued Clinical Symptoms:    The "Alcohol Use Disorders Identification Test", Guidelines for Use in Primary Care, Second Edition.  World Science writer [facility]). Score between 0-7:  no or low risk or alcohol related problems. Score between 8-15:  moderate risk of alcohol related problems. Score between 16-19:  high risk of alcohol related problems. Score 20 or above:  warrants further diagnostic evaluation for alcohol dependence and treatment.   CLINICAL FACTORS:   Bipolar Disorder:   Depressive phase Alcohol/Substance Abuse/Dependencies   Musculoskeletal: Strength & Muscle Tone: within normal limits Gait & Station: normal Patient leans: N/A  Psychiatric Specialty Exam: Physical Exam  Nursing note and vitals reviewed. Constitutional: She is oriented to person, place, and time. She appears well-developed and well-nourished.  HENT:  Head: Normocephalic and atraumatic.  Respiratory: Effort normal.  Neurological: She is alert and oriented to person, place, and time.    ROS  Blood pressure 101/74, pulse 81, temperature 98.3 F (36.8 C), temperature source Oral, resp. rate 16, height 4\' 11"  (1.499 m), weight 53.1 kg, last menstrual period 02/05/2018, SpO2 97 %.Body mass index is 23.63 kg/m.  General Appearance: Disheveled  Eye Contact:  Fair  Speech:  Normal Rate  Volume:  Decreased  Mood:  Anxious, Depressed and Dysphoric  Affect:  Congruent  Thought Process:  Coherent and Descriptions of Associations: Circumstantial  Orientation:  Full (Time, Place, and Person)  Thought Content:  Logical  Suicidal Thoughts:  Yes.  without intent/plan  Homicidal Thoughts:  No  Memory:  Immediate;   Fair Recent;  Fair Remote;   Fair  Judgement:  Impaired  Insight:  Lacking  Psychomotor Activity:  Increased  Concentration:  Concentration: Fair and Attention Span: Fair  Recall:  [employer]  Fund of Knowledge:  Fair  Language:  Fair   Akathisia:  Negative  Handed:  Right  AIMS (if indicated):     Assets:  Desire for Improvement Resilience  ADL's:  Intact  Cognition:  WNL  Sleep:         COGNITIVE FEATURES THAT CONTRIBUTE TO RISK:  None    SUICIDE RISK:   Mild:  Suicidal ideation of limited frequency, intensity, duration, and specificity.  There are no identifiable plans, no associated intent, mild dysphoria and related symptoms, good self-control (both objective and subjective assessment), few other risk factors, and identifiable protective factors, including available and accessible social support.  PLAN OF CARE: Patient is seen and examined.  Patient is a 38 year old female with the above-stated past psychiatric history who presented to the emergency department today with suicidal ideation with intent of overdosing on heroin.  She will be admitted to the hospital.  She will be integrated into the milieu.  She will be placed on the opiate withdrawal protocol.  We will hold medications for bipolar disorder initially.  We will see how she looks during the course of the hospitalization for withdrawal.  She is very somatic and is complaining of significant pain currently.  Review of the electronic medical record showed that this had occurred several times when she had been [facility] to [facility] the past.  We will monitor for this.  She also has a mild leukopenia and anemia.  She stated that they told her she was iron deficient, but it does not sound as though this was worked up.  We will get an anemia panel on her.  Additionally she has a leukopenia, and I asked her about previous hepatitis and HIV testing.  She stated she had had it tested sometime in the last several months and she was negative at that time.  Given her physical state and symptoms I think it would be prudent to repeat these.  Otherwise I am concerned about the possibility of MRSA, so we will swab her nose and see if it is positive for that given the scabs and  picking from the probable methamphetamine abuse.  Outside of her anemia and her drug screen rest of her labs are at least normal at this point.  I certify that inpatient services furnished can reasonably be expected to improve the patient's condition.   Antonieta PertGreg Lawson Shanterica Biehler, MD 02/12/2018, 2:14 PM

## 2018-02-12 NOTE — ED Notes (Signed)
Campos, MD at bedside.  

## 2018-02-13 LAB — LIPID PANEL
CHOL/HDL RATIO: 4.9 ratio
Cholesterol: 108 mg/dL (ref 0–200)
HDL: 22 mg/dL — AB (ref >40)
LDL Cholesterol: 64 mg/dL (ref 0–99)
Triglycerides: 110 mg/dL (ref <150)
VLDL: 22 mg/dL (ref 0–40)

## 2018-02-13 LAB — HEPATITIS PANEL, ACUTE
HCV Ab: >11 s/co ratio — ABNORMAL HIGH (ref 0.0–0.9)
HEP B S AG: NEGATIVE
Hep A IgM: NEGATIVE
Hep B C IgM: NEGATIVE

## 2018-02-13 LAB — TSH: TSH: 3.311 u[IU]/mL (ref 0.350–4.500)

## 2018-02-13 LAB — HEMOGLOBIN A1C
Hgb A1c MFr Bld: 5.1 % (ref 4.8–5.6)
Mean Plasma Glucose: 99.67 mg/dL

## 2018-02-13 MED ORDER — SERTRALINE HCL 25 MG PO TABS
25.0000 mg | ORAL_TABLET | Freq: Every day | ORAL | Status: DC
  Administered 2018-02-13 – 2018-02-14 (×2): 25 mg via ORAL
  Filled 2018-02-13 (×3): qty 1

## 2018-02-13 MED ORDER — TRAZODONE HCL 100 MG PO TABS
100.0000 mg | ORAL_TABLET | Freq: Every evening | ORAL | Status: DC | PRN
  Administered 2018-02-13 – 2018-02-15 (×4): 100 mg via ORAL
  Filled 2018-02-13: qty 1

## 2018-02-13 MED ORDER — BACITRACIN-NEOMYCIN-POLYMYXIN OINTMENT TUBE
TOPICAL_OINTMENT | Freq: Three times a day (TID) | CUTANEOUS | Status: DC
  Administered 2018-02-15 (×3): via TOPICAL
  Administered 2018-02-17 (×2): 1 via TOPICAL
  Administered 2018-02-17: 09:00:00 via TOPICAL
  Filled 2018-02-13: qty 14.17

## 2018-02-13 MED ORDER — ONDANSETRON HCL 4 MG PO TABS
8.0000 mg | ORAL_TABLET | Freq: Three times a day (TID) | ORAL | Status: DC | PRN
  Administered 2018-02-15 – 2018-02-18 (×3): 8 mg via ORAL
  Filled 2018-02-13 (×3): qty 2

## 2018-02-13 NOTE — BHH Suicide Risk Assessment (Signed)
[facility] INPATIENT:  Family/Significant Other Suicide Prevention Education  Suicide Prevention Education:  Patient Refusal for Family/Significant Other Suicide Prevention Education: The patient Julie Sanford has refused to provide written consent for family/significant other to be provided Family/Significant Other Suicide Prevention Education during admission and/or prior to discharge.  Physician notified.  SPE completed with patient, as patient refused to consent to family contact. SPI pamphlet provided to pt and pt was encouraged to share information with support network, ask questions, and talk about any concerns relating to SPE. Patient denies access to guns/firearms and verbalized understanding of information provided. Mobile Crisis information also provided to patient.    [name] 02/13/2018, 10:39 AM

## 2018-02-13 NOTE — Progress Notes (Signed)
[facility] MD Progress Note  02/13/2018 1:03 PM Julie Sanford  MRN:  [id number] Subjective:  "I don't feel good."  Julie Sanford found resting in bed. Appears fatigued and dysphoric. Reports discomfort from withdrawal symptoms today- nausea with one episode of vomiting this AM, diaphoresis, headache, fatigue, and myalgias.  Patient notified of PRN medications already ordered for nausea and pain and encouraged to notify RN if these medications are needed. She reports depressed mood today,  attributed mostly to her detox symptoms. Denies SI and AVH. Requests to start Zoloft because she has done well on this in the past. Hepatitis C resulted positive. HIV result still pending. Patient was informed of Hep C result and that assistance for treatment exists as long as she can maintain sobriety. Patient stated understanding. Support and encouragement provided.  From admission H&P: Julie Sanford is a single 38 year old female with history of heroin and methamphetamine use, depression, bipolar disorder, and seizures who presents for treatment of heroin overdose. Reports that she purposely overdosed as a suicide attempt. Her family administered Narcan and EMS was called. She reports heroin use since 2009, with longest period of sobriety 6 months. She relapsed a week ago and has been using daily. Also reports intermittent meth use, with last use 2-3 weeks ago but states "Heroin is my drug of choice." Extensive scabbing across body- "I pick when I get anxious." UDS positive for opioids only. Her stepfather kicked her out of the home yesterday; her three children remain with her mother and stepfather. Reports the holidays are hard for her after her 62-year-old passed away two years ago. Reports depressed mood for several weeks with decreased sleep, fatigue, diminished appetite, and hopelessness. Denies SI, HI, AVH. Principal Problem: Heroin dependence (HCC) Diagnosis: Principal Problem:   Heroin dependence (HCC) Active Problems:   MDD  (major depressive disorder), recurrent severe, without psychosis (HCC)  Total Time spent with patient: 15 minutes  Past Psychiatric History: See admission H&P  Past Medical History:  Past Medical History:  Diagnosis Date  . Bipolar 1 disorder (HCC)   . Depression   . Herpes   . Seizures (HCC)     Past Surgical History:  Procedure Laterality Date  . CESAREAN SECTION     Family History:  Family History  Problem Relation Age of Onset  . Diabetes Sister    Family Psychiatric  History: See admission H&P Social History:  Social History   Substance and Sexual Activity  Alcohol Use No     Social History   Substance and Sexual Activity  Drug Use Yes  . Types: Heroin   Comment: was clean for 6 mths, using x 1 week daily    Social History   Socioeconomic History  . Marital status: Legally Separated    Spouse name: Not on file  . Number of children: Not on file  . Years of education: Not on file  . Highest education level: Not on file  Occupational History  . Not on file  Social Needs  . Financial resource strain: Not on file  . Food insecurity:    Worry: Not on file    Inability: Not on file  . Transportation needs:    Medical: Not on file    Non-medical: Not on file  Tobacco Use  . Smoking status: Current Every Day Smoker    Packs/day: 2.00    Types: Cigarettes  . Smokeless tobacco: Never Used  Substance and Sexual Activity  . Alcohol use: No  . Drug use:  Yes    Types: Heroin    Comment: was clean for 6 mths, using x 1 week daily  . Sexual activity: Never    Comment: heroin  Lifestyle  . Physical activity:    Days per week: Not on file    Minutes per session: Not on file  . Stress: Not on file  Relationships  . Social connections:    Talks on phone: Not on file    Gets together: Not on file    Attends religious service: Not on file    Active member of club or organization: Not on file    Attends meetings of clubs or organizations: Not on file     Relationship status: Not on file  Other Topics Concern  . Not on file  Social History Narrative  . Not on file   Additional Social History:                         Sleep: Fair  Appetite:  Fair  Current Medications: Current Facility-Administered Medications  Medication Dose Route Frequency Provider Last Rate Last Dose  . acetaminophen (TYLENOL) tablet 650 mg  650 mg Oral Q4H PRN Maryagnes AmosStarkes-Perry, Takia S, FNP      . albuterol (PROVENTIL HFA;VENTOLIN HFA) 108 (90 Base) MCG/ACT inhaler 2 puff  2 puff Inhalation Q4H PRN Rosario AdieStarkes-Perry, Juel Burrowakia S, FNP      . alum & mag hydroxide-simeth (MAALOX/MYLANTA) 200-200-20 MG/5ML suspension 30 mL  30 mL Oral Q6H PRN Starkes-Perry, Juel Burrowakia S, FNP      . cloNIDine (CATAPRES) tablet 0.1 mg  0.1 mg Oral QID Armandina StammerNwoko, Agnes I, NP   0.1 mg at 02/13/18 0815   Followed by  . [START ON 02/14/2018] cloNIDine (CATAPRES) tablet 0.1 mg  0.1 mg Oral BH-qamhs Nwoko, Agnes I, NP       Followed by  . [START ON 02/16/2018] cloNIDine (CATAPRES) tablet 0.1 mg  0.1 mg Oral QAC breakfast Nwoko, Agnes I, NP      . dicyclomine (BENTYL) tablet 20 mg  20 mg Oral Q6H PRN Nwoko, Agnes I, NP      . hydrOXYzine (ATARAX/VISTARIL) tablet 100 mg  100 mg Oral TID PRN Maryagnes AmosStarkes-Perry, Takia S, FNP   100 mg at 02/12/18 1734  . levETIRAcetam (KEPPRA) tablet 1,000 mg  1,000 mg Oral BID Maryagnes AmosStarkes-Perry, Takia S, FNP   1,000 mg at 02/13/18 0816  . magnesium hydroxide (MILK OF MAGNESIA) suspension 30 mL  30 mL Oral Daily PRN Starkes-Perry, Juel Burrowakia S, FNP      . methocarbamol (ROBAXIN) tablet 500 mg  500 mg Oral Q8H PRN Armandina StammerNwoko, Agnes I, NP   500 mg at 02/13/18 [id number]  . naproxen (NAPROSYN) tablet 500 mg  500 mg Oral BID PRN Armandina StammerNwoko, Agnes I, NP   500 mg at 02/13/18 [id number]  . neomycin-bacitracin-polymyxin (NEOSPORIN) ointment   Topical TID Antonieta Pertlary, Greg Lawson, MD      . nicotine polacrilex (NICORETTE) gum 2 mg  2 mg Oral PRN Armandina StammerNwoko, Agnes I, NP      . ondansetron (ZOFRAN) tablet 8 mg  8 mg Oral Q8H PRN Antonieta Pertlary,  Greg Lawson, MD      . sertraline (ZOLOFT) tablet 25 mg  25 mg Oral Daily Antonieta Pertlary, Greg Lawson, MD   25 mg at 02/13/18 1216    Lab Results:  Results for orders placed or performed during the hospital encounter of 02/12/18 (from the past 48 hour(s))  MRSA PCR Screening  Status: None   Collection Time: 02/12/18  3:32 PM  Result Value Ref Range   MRSA by PCR NEGATIVE NEGATIVE    Comment:        The GeneXpert MRSA Assay (FDA approved for NASAL specimens only), is one component of a comprehensive MRSA colonization surveillance program. It is not intended to diagnose MRSA infection nor to guide or monitor treatment for MRSA infections. Performed at [facility], [address] [place], [place] [address]   Hepatitis panel, acute     Status: Abnormal   Collection Time: 02/12/18  6:47 PM  Result Value Ref Range   Hepatitis B Surface Ag Negative Negative   HCV Ab >11.0 (H) 0.0 - 0.9 s/co ratio    Comment: (NOTE)                                  Negative:     < 0.8                             Indeterminate: 0.8 - 0.9                                  Positive:     > 0.9 The CDC recommends that a positive HCV antibody result be followed up with a HCV Nucleic Acid Amplification test (604540). Performed At: [facility] [address] [place], [place] [id number] Jolene Schimke MD [id number]    Hep A IgM Negative Negative   Hep B C IgM Negative Negative  Vitamin B12     Status: None   Collection Time: 02/12/18  6:47 PM  Result Value Ref Range   Vitamin B-12 349 180 - 914 pg/mL    Comment: (NOTE) This assay is not validated for testing neonatal or myeloproliferative syndrome specimens for Vitamin B12 levels. Performed at [facility], [address], [place], [place] [address]   Folate     Status: None   Collection Time: 02/12/18  6:47 PM  Result Value Ref Range   Folate 16.4 >5.9 ng/mL    Comment: Performed at [facility], [address], [place], [place] [address]  Iron and TIBC     Status: Abnormal   Collection Time: 02/12/18  6:47 PM  Result Value Ref Range   Iron 12 (L) 28 - 170 ug/dL   TIBC 413 244 - 010 ug/dL   Saturation Ratios 3 (L) 10.4 - 31.8 %   UIBC 335 ug/dL    Comment: Performed at [facility], [address] [place], [place] [address]  Ferritin     Status: Abnormal   Collection Time: 02/12/18  6:47 PM  Result Value Ref Range   Ferritin 5 (L) 11 - 307 ng/mL    Comment: Performed at [facility], [address] [place], [place] [address]  Reticulocytes     Status: Abnormal   Collection Time: 02/12/18  6:47 PM  Result Value Ref Range   Retic Ct Pct 2.1 0.4 - 3.1 %   RBC. 3.39 (L) 3.87 - 5.11 MIL/uL   Retic Count, Absolute 83.0 19.0 - 186.0 K/uL   Immature Retic Fract 13.9 2.3 - 15.9 %    Comment: Performed at [facility], [address] [place], [place]  [address]  Hemoglobin A1c     Status: None   Collection Time: 02/13/18  6:26 AM  Result Value Ref Range   Hgb A1c MFr Bld 5.1 4.8 - 5.6 %    Comment: (NOTE) Pre diabetes:          5.7%-6.4% Diabetes:              >6.4% Glycemic control for   <7.0% adults with diabetes    Mean Plasma Glucose 99.67 mg/dL    Comment: Performed at [facility], [address], [place], [place] [address]  Lipid panel     Status: Abnormal   Collection Time: 02/13/18  6:26 AM  Result Value Ref Range   Cholesterol 108 0 - 200 mg/dL   Triglycerides 098 <119 mg/dL   HDL 22 (L) >14 mg/dL   Total CHOL/HDL Ratio 4.9 RATIO   VLDL 22 0 - 40 mg/dL   LDL Cholesterol 64 0 - 99 mg/dL    Comment:        Total Cholesterol/HDL:CHD Risk Coronary Heart Disease Risk Table                     Men   Women  1/2 Average Risk   3.4   3.3  Average Risk       5.0   4.4  2 X Average Risk   9.6   7.1  3 X Average Risk  23.4   11.0        Use the calculated Patient Ratio above and the  CHD Risk Table to determine the patient's CHD Risk.        ATP III CLASSIFICATION (LDL):  <100     mg/dL   Optimal  782-956  mg/dL   Near or Above                    Optimal  130-159  mg/dL   Borderline  213-086  mg/dL   High  >578     mg/dL   Very High Performed at [facility], [address] [place], [place] [address]   TSH     Status: None   Collection Time: 02/13/18  6:26 AM  Result Value Ref Range   TSH 3.311 0.350 - 4.500 uIU/mL    Comment: Performed by a 3rd Generation assay with a functional sensitivity of <=0.01 uIU/mL. Performed at [facility], [address] [place], [place] [address]     Blood Alcohol level:  Lab Results  Component Value Date   [facility] <10 02/12/2018   ETH <10 01/28/2018    Metabolic Disorder Labs: Lab Results  Component Value Date   HGBA1C 5.1 02/13/2018   MPG 99.67 02/13/2018   No results found for: PROLACTIN Lab Results  Component Value Date   CHOL 108 02/13/2018   TRIG 110 02/13/2018   HDL 22 (L) 02/13/2018   CHOLHDL 4.9 02/13/2018   VLDL 22 02/13/2018   LDLCALC 64 02/13/2018    Physical Findings: AIMS: Facial and Oral Movements Muscles of Facial Expression: None, normal Lips and Perioral Area: None, normal Jaw: None, normal Tongue: None, normal,Extremity Movements Upper (arms, wrists, hands, fingers): None, normal Lower (legs, knees, ankles, toes): None, normal, Trunk Movements Neck, shoulders, hips: None, normal, Overall Severity Severity of abnormal movements (highest score from questions above): None, normal Incapacitation due to abnormal movements: None, normal, Dental Status Current problems with teeth and/or dentures?: Yes Does patient usually wear  dentures?: No  CIWA:    COWS:  COWS Total Score: 0  Musculoskeletal: Strength & Muscle Tone: within normal limits Gait & Station: normal Patient leans: N/A  Psychiatric Specialty Exam: Physical Exam  Nursing note and vitals  reviewed. Constitutional: She is oriented to person, place, and time. She appears well-developed.  Cardiovascular: Normal rate.  Respiratory: Effort normal.  Neurological: She is alert and oriented to person, place, and time.    Review of Systems  Constitutional: Positive for diaphoresis and malaise/fatigue.  Respiratory: Negative.   Cardiovascular: Negative.   Gastrointestinal: Positive for abdominal pain (cramps), nausea and vomiting (x1 this morning).  Musculoskeletal: Positive for myalgias.  Neurological: Positive for headaches.  Psychiatric/Behavioral: Positive for depression and substance abuse (heroin detox). Negative for hallucinations, memory loss and suicidal ideas. The patient is not nervous/anxious and does not have insomnia.     Blood pressure (!) 91/53, pulse 63, temperature 98.3 F (36.8 C), temperature source Oral, resp. rate 16, height 4\' 11"  (1.499 m), weight 53.1 kg, last menstrual period 02/05/2018, SpO2 97 %.Body mass index is 23.63 kg/m.  General Appearance: Disheveled  Eye Contact:  Good  Speech:  Clear and Coherent  Volume:  Normal  Mood:  Dysphoric  Affect:  Congruent  Thought Process:  Coherent  Orientation:  Full (Time, Place, and Person)  Thought Content:  WDL  Suicidal Thoughts:  No  Homicidal Thoughts:  No  Memory:  Immediate;   Good  Judgement:  Fair  Insight:  Fair  Psychomotor Activity:  Normal  Concentration:  Concentration: Good and Attention Span: Good  Recall:  Good  Fund of Knowledge:  Fair  Language:  Good  Akathisia:  No  Handed:  Right  AIMS (if indicated):     Assets:  Communication Skills Desire for Improvement  ADL's:  Intact  Cognition:  WNL  Sleep:  Number of Hours: 6.75     Treatment Plan Summary: Daily contact with patient to assess and evaluate symptoms and progress in treatment and Medication management   Continue inpatient hospitalization.  Heroin detox: Continue COWS clonidine protocol  Mood: Start Zoloft 25  mg PO daily  Other medical: Continue Keppra 1000 mg PO BID for seizure disorder  Patient will participate in the therapeutic group milieu.  Discharge disposition in progress.   Aldean BakerJanet E Corlette Ciano, NP 02/13/2018, 1:03 PM

## 2018-02-13 NOTE — BHH Group Notes (Signed)
The focus of this group is to help patients establish daily goals to achieve during treatment and discuss how the patient can incorporate goal setting into their daily lives to aide in recovery.  PT did not attend 

## 2018-02-13 NOTE — Progress Notes (Signed)
Pt was in the bed most of the evening, but came out around 2130 to get a snack and watch TV.  Another patient came to the NS saying that pt was slumped over in the chair.  When writer got to her, she was sitting up, with her head bent over as if she was asleep.  She immediately responded when Clinical research associate called her name.  Pt said she was ok, but sleepy and wanted her hs meds to go back to sleep.  Pt was able to get up on her own, but Clinical research associate encouraged her to go to her room and let writer bring her meds which was done.  Pt denies SI/HI/AVH at this time.  She voiced no needs or concerns other than withdrawal symptoms.  Support and encouragement offered.  Discharge plans are in process.  Safety maintained with q15 minute checks.

## 2018-02-13 NOTE — Plan of Care (Signed)
Patient was depressed and anxious upon approach this morning. Patient is complaining of withdrawal symptoms such as headache, anxiety, sweats, tremors. PRN medications given. Denies SI HI AVH. Endorses physical pain, being body aches. Patent is complaint with medications prescribed per provider. No side effects noted. Safety is maintained with 15 minute checks as well as environmental checks. Will continue to monitor and provide support.  Problem: Education: Goal: Verbalization of understanding the information provided will improve Outcome: Progressing   Problem: Activity: Goal: Sleeping patterns will improve Outcome: Progressing   Problem: Education: Goal: Emotional status will improve Outcome: Not Progressing Goal: Mental status will improve Outcome: Not Progressing   Problem: Activity: Goal: Interest or engagement in activities will improve Outcome: Not Progressing
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
## 2018-02-13 NOTE — Tx Team (Signed)
Interdisciplinary Treatment and Diagnostic Plan Update  02/13/2018 Time of Session: 9:00am Julie Sanford MRN: [id number]  Principal Diagnosis: Heroin dependence ([name])  Secondary Diagnoses: Principal Problem:   Heroin dependence ([name]) Active Problems:   MDD (major depressive disorder), recurrent severe, without psychosis ([name])   Current Medications:  Current Facility-Administered Medications  Medication Dose Route Frequency Provider Last Rate Last Dose  . acetaminophen (TYLENOL) tablet 650 mg  650 mg Oral Q4H PRN Suella Broad, FNP      . albuterol (PROVENTIL HFA;VENTOLIN HFA) 108 (90 Base) MCG/ACT inhaler 2 puff  2 puff Inhalation Q4H PRN Burt Ek, Gayland Curry, FNP      . alum & mag hydroxide-simeth (MAALOX/MYLANTA) 200-200-20 MG/5ML suspension 30 mL  30 mL Oral Q6H PRN Starkes-Perry, Gayland Curry, FNP      . benzonatate (TESSALON) capsule 100 mg  100 mg Oral BID PRN Suella Broad, FNP   100 mg at 02/13/18 0819  . cloNIDine (CATAPRES) tablet 0.1 mg  0.1 mg Oral QID Lindell Spar I, NP   0.1 mg at 02/13/18 0815   Followed by  . [START ON 02/14/2018] cloNIDine (CATAPRES) tablet 0.1 mg  0.1 mg Oral BH-qamhs Nwoko, Agnes I, NP       Followed by  . [START ON 02/16/2018] cloNIDine (CATAPRES) tablet 0.1 mg  0.1 mg Oral QAC breakfast Nwoko, Agnes I, NP      . dicyclomine (BENTYL) tablet 20 mg  20 mg Oral Q6H PRN Nwoko, Agnes I, NP      . doxycycline (VIBRA-TABS) tablet 100 mg  100 mg Oral Q12H Suella Broad, FNP   100 mg at 02/13/18 0816  . hydrOXYzine (ATARAX/VISTARIL) tablet 100 mg  100 mg Oral TID PRN Suella Broad, FNP   100 mg at 02/12/18 1734  . levETIRAcetam (KEPPRA) tablet 1,000 mg  1,000 mg Oral BID Suella Broad, FNP   1,000 mg at 02/13/18 0816  . magnesium hydroxide (MILK OF MAGNESIA) suspension 30 mL  30 mL Oral Daily PRN Starkes-Perry, Gayland Curry, FNP      . methocarbamol (ROBAXIN) tablet 500 mg  500 mg Oral Q8H PRN Lindell Spar I, NP   500 mg at  02/13/18 7622  . naproxen (NAPROSYN) tablet 500 mg  500 mg Oral BID PRN Lindell Spar I, NP   500 mg at 02/13/18 6333  . nicotine polacrilex (NICORETTE) gum 2 mg  2 mg Oral PRN Lindell Spar I, NP      . ondansetron (ZOFRAN) tablet 4 mg  4 mg Oral Q8H PRN Suella Broad, FNP       PTA Medications: Medications Prior to Admission  Medication Sig Dispense Refill Last Dose  . DM-Doxylamine-Acetaminophen (NYQUIL HBP COLD & FLU) 15-6.25-325 MG/15ML LIQD Take 15 mLs by mouth every 4 (four) hours as needed (cold , flu symptoms).   02/11/2018 at Unknown time  . ibuprofen (ADVIL,MOTRIN) 200 MG tablet Take 800 mg by mouth every 6 (six) hours as needed for headache.   Past Week at Unknown time  . levETIRAcetam (KEPPRA) 500 MG tablet Take 500 mg by mouth 2 (two) times daily.   6 months ago  . Melatonin 10 MG TBCR Take 1 tablet by mouth at bedtime.   02/10/2018  . nicotine (NICODERM CQ - DOSED IN MG/24 HOURS) 21 mg/24hr patch Place 1 patch (21 mg total) onto the skin daily. (Patient not taking: Reported on 01/21/2018) 28 patch 0 Not Taking at Unknown time    Patient Stressors: Museum/gallery curator  difficulties Marital or family conflict Substance abuse Traumatic event  Patient Strengths: Ability for insight Capable of independent living General fund of knowledge  Treatment Modalities: Medication Management, Group therapy, Case management,  1 to 1 session with clinician, Psychoeducation, Recreational therapy.   Physician Treatment Plan for Primary Diagnosis: Heroin dependence ([name]) Long Term Goal(s): Improvement in symptoms so as ready for discharge Improvement in symptoms so as ready for discharge   Short Term Goals: Ability to identify changes in lifestyle to reduce recurrence of condition will improve Ability to verbalize feelings will improve Ability to disclose and discuss suicidal ideas Ability to demonstrate self-control will improve Ability to identify and develop effective coping behaviors  will improve Compliance with prescribed medications will improve  Medication Management: Evaluate patient's response, side effects, and tolerance of medication regimen.  Therapeutic Interventions: 1 to 1 sessions, Unit Group sessions and Medication administration.  Evaluation of Outcomes: Not Met  Physician Treatment Plan for Secondary Diagnosis: Principal Problem:   Heroin dependence ([place]) Active Problems:   MDD (major depressive disorder), recurrent severe, without psychosis ([name])  Long Term Goal(s): Improvement in symptoms so as ready for discharge Improvement in symptoms so as ready for discharge   Short Term Goals: Ability to identify changes in lifestyle to reduce recurrence of condition will improve Ability to verbalize feelings will improve Ability to disclose and discuss suicidal ideas Ability to demonstrate self-control will improve Ability to identify and develop effective coping behaviors will improve Compliance with prescribed medications will improve     Medication Management: Evaluate patient's response, side effects, and tolerance of medication regimen.  Therapeutic Interventions: 1 to 1 sessions, Unit Group sessions and Medication administration.  Evaluation of Outcomes: Not Met   RN Treatment Plan for Primary Diagnosis: Heroin dependence ([name]) Long Term Goal(s): Knowledge of disease and therapeutic regimen to maintain health will improve  Short Term Goals: Ability to remain free from injury will improve, Ability to verbalize frustration and anger appropriately will improve, Ability to verbalize feelings will improve, Ability to disclose and discuss suicidal ideas, Ability to identify and develop effective coping behaviors will improve and Compliance with prescribed medications will improve  Medication Management: RN will administer medications as ordered by provider, will assess and evaluate patient's response and provide education to patient for prescribed  medication. RN will report any adverse and/or side effects to prescribing provider.  Therapeutic Interventions: 1 on 1 counseling sessions, Psychoeducation, Medication administration, Evaluate responses to treatment, Monitor vital signs and CBGs as ordered, Perform/monitor CIWA, COWS, AIMS and Fall Risk screenings as ordered, Perform wound care treatments as ordered.  Evaluation of Outcomes: Not Met   LCSW Treatment Plan for Primary Diagnosis: Heroin dependence ([place]) Long Term Goal(s): Safe transition to appropriate next level of care at discharge, Engage patient in therapeutic group addressing interpersonal concerns.  Short Term Goals: Engage patient in aftercare planning with referrals and resources, Increase social support, Increase emotional regulation, Facilitate patient progression through stages of change regarding substance use diagnoses and concerns, Identify triggers associated with mental health/substance abuse issues and Increase skills for wellness and recovery  Therapeutic Interventions: Assess for all discharge needs, 1 to 1 time with Social worker, Explore available resources and support systems, Assess for adequacy in community support network, Educate family and significant other(s) on suicide prevention, Complete Psychosocial Assessment, Interpersonal group therapy.  Evaluation of Outcomes: Not Met   Progress in Treatment: Attending groups: No. New to unit. Participating in groups: No. Taking medication as prescribed: Yes. Toleration medication: Yes.  Family/Significant other contact made: No, will contact:  supports if consent is given Patient understands diagnosis: Yes. Discussing patient identified problems/goals with staff: Yes. Medical problems stabilized or resolved: No. Denies suicidal/homicidal ideation: Yes. Issues/concerns per patient self-inventory: Yes.  New problem(s) identified: Yes, Describe:  reports being kicked out by family; may need shelter  resources  New Short Term/Long Term Goal(s): detox, medication management for mood stabilization; elimination of SI thoughts; development of comprehensive mental wellness/sobriety plan.  Patient Goals: "Work on my sobriety. Be better with family."  Discharge Plan or Barriers: May need homeless shelter resources. [place] pamphlet, [facility] Crisis information, and AA/NA information provided to patient for additional community support and resources.   Reason for Continuation of Hospitalization: Anxiety Depression Medical Issues Withdrawal symptoms  Estimated Length of Stay: 3-5 days  Attendees: Patient: 02/13/2018 10:05 AM  Physician:  02/13/2018 10:05 AM  Nursing:  02/13/2018 10:05 AM  RN Care Manager: 02/13/2018 10:05 AM  Social Worker: Stephanie Acre, [place] 02/13/2018 10:05 AM  Recreational Therapist:  02/13/2018 10:05 AM  Other:  02/13/2018 10:05 AM  Other:  02/13/2018 10:05 AM  Other: 02/13/2018 10:05 AM    Scribe for Treatment Team: Joellen Jersey, [place] 02/13/2018 10:05 AM

## 2018-02-13 NOTE — Progress Notes (Signed)
D: Pt denies SI/HI/AVH. Pt is pleasant and cooperative. Pt endorsed pain and anxiety, pt encouraged and educated on non-pharmacological methods to help remedy those issues.   A: Pt was offered support and encouragement. Pt was given scheduled medications. Pt was encourage to attend groups. Q 15 minute checks were done for safety.   R:Pt attends groups and interacts well with peers and staff. Pt is taking medication. Pt has no complaints.Pt receptive to treatment and safety maintained on unit.   Problem: Education: Goal: Emotional status will improve Outcome: Progressing   Problem: Education: Goal: Mental status will improve Outcome: Progressing   Problem: Activity: Goal: Interest or engagement in activities will improve Outcome: Progressing

## 2018-02-14 LAB — HIV-1 RNA, QUALITATIVE, TMA: HIV-1 RNA, Qualitative, TMA: NEGATIVE

## 2018-02-14 MED ORDER — SERTRALINE HCL 50 MG PO TABS
50.0000 mg | ORAL_TABLET | Freq: Every day | ORAL | Status: DC
  Administered 2018-02-15 – 2018-02-18 (×4): 50 mg via ORAL
  Filled 2018-02-14 (×5): qty 1

## 2018-02-14 NOTE — [facility] Group Notes (Signed)
LCSW Group Therapy Note  02/14/2018    10:00-11:00am   Type of Therapy and Topic:  Group Therapy: Early Messages Received About Anger  Participation Level:  Did Not Attend   Description of Group:   In this group, patients shared and discussed the early messages received in their lives about anger through parental or other adult modeling, teaching, repression, punishment, violence, and more.  Participants identified how those childhood lessons influence even now how they usually or often react when angered.  The group discussed that anger is a secondary emotion and what may be the underlying emotional themes that come out through anger outbursts or that are ignored through anger suppression.  Finally, as a group there was a conversation about the workbook's quote that "There is nothing wrong with anger; it is just a sign something needs to change."     Therapeutic Goals: 1. Patients will identify one or more childhood message about anger that they received and how it was taught to them. 2. Patients will discuss how these childhood experiences have influenced and continue to influence their own expression or repression of anger even today. 3. Patients will explore possible primary emotions that tend to fuel their secondary emotion of anger. 4. Patients will learn that anger itself is normal and cannot be eliminated, and that healthier coping skills can assist with resolving conflict rather than worsening situations.  Summary of Patient Progress:  N/A  Therapeutic Modalities:   Cognitive Behavioral Therapy Motivation Interviewing  Julie Sanford  .  

## 2018-02-14 NOTE — Progress Notes (Signed)
[facility] MD Progress Note  02/14/2018 11:03 AM Julie Sanford  MRN:  [id number]  Evaluation: Julie Sanford observed resting in bed.  Awake alert and oriented x3.  Presents flat guarded and depressed. Julie Sanford  reports"  I hurt all over, and detoxing from heroin".  Reports nausea, muscle aches/fatigue and headaches.  Denies suicidal homicidal ideations during this assessment.  Rates her depression 7 out of 10 with 10 being the worst.  Discussed titration of Zoloft from 25 mg to 50 mg patient was agreeable to plan.  Patient was isolative to room most of the morning.  Patient was encouraged to participate within the milieu.  minimal engagement throughout this assessment.  Reports attending 1 group session since her admission.  Denies auditory or visual hallucinations.  Reports she is resting fairly throughout the night.  Reports a fair appetite.  Support encouragement and reassurance was provided.    History: Per assessment note:  Julie Sanford is a single 38 year old female with history of heroin and methamphetamine use, depression, bipolar disorder, and seizures who presents for treatment of heroin overdose. Reports that she purposely overdosed as a suicide attempt. Her family administered Narcan and EMS was called. She reports heroin use since 2009, with longest period of sobriety 6 months. She relapsed a week ago and has been using daily. Also reports intermittent meth use, with last use 2-3 weeks ago but states "Heroin is my drug of choice." Extensive scabbing across body- "I pick when I get anxious." UDS positive for opioids only. Her stepfather kicked her out of the home yesterday; her three children remain with her mother and stepfather. Reports the holidays are hard for her after her 58-year-old passed away two years ago. Reports depressed mood for several weeks with decreased sleep, fatigue, diminished appetite, and hopelessness. Denies SI, HI, AVH.   Principal Problem: Heroin dependence (HCC) Diagnosis:  Principal Problem:   Heroin dependence (HCC) Active Problems:   MDD (major depressive disorder), recurrent severe, without psychosis (HCC)  Total Time spent with patient: 15 minutes  Past Psychiatric History: See admission H&P  Past Medical History:  Past Medical History:  Diagnosis Date  . Bipolar 1 disorder (HCC)   . Depression   . Herpes   . Seizures (HCC)     Past Surgical History:  Procedure Laterality Date  . CESAREAN SECTION     Family History:  Family History  Problem Relation Age of Onset  . Diabetes Sister    Family Psychiatric  History: See admission H&P Social History:  Social History   Substance and Sexual Activity  Alcohol Use No     Social History   Substance and Sexual Activity  Drug Use Yes  . Types: Heroin   Comment: was clean for 6 mths, using x 1 week daily    Social History   Socioeconomic History  . Marital status: Legally Separated    Spouse name: Not on file  . Number of children: Not on file  . Years of education: Not on file  . Highest education level: Not on file  Occupational History  . Not on file  Social Needs  . Financial resource strain: Not on file  . Food insecurity:    Worry: Not on file    Inability: Not on file  . Transportation needs:    Medical: Not on file    Non-medical: Not on file  Tobacco Use  . Smoking status: Current Every Day Smoker    Packs/day: 2.00    Types: Cigarettes  .  Smokeless tobacco: Never Used  Substance and Sexual Activity  . Alcohol use: No  . Drug use: Yes    Types: Heroin    Comment: was clean for 6 mths, using x 1 week daily  . Sexual activity: Never    Comment: heroin  Lifestyle  . Physical activity:    Days per week: Not on file    Minutes per session: Not on file  . Stress: Not on file  Relationships  . Social connections:    Talks on phone: Not on file    Gets together: Not on file    Attends religious service: Not on file    Active member of club or organization: Not on  file    Attends meetings of clubs or organizations: Not on file    Relationship status: Not on file  Other Topics Concern  . Not on file  Social History Narrative  . Not on file   Additional Social History:                         Sleep: Fair  Appetite:  Fair  Current Medications: Current Facility-Administered Medications  Medication Dose Route Frequency Provider Last Rate Last Dose  . acetaminophen (TYLENOL) tablet 650 mg  650 mg Oral Q4H PRN Maryagnes AmosStarkes-Perry, Takia S, FNP      . albuterol (PROVENTIL HFA;VENTOLIN HFA) 108 (90 Base) MCG/ACT inhaler 2 puff  2 puff Inhalation Q4H PRN Rosario AdieStarkes-Perry, Juel Burrowakia S, FNP      . alum & mag hydroxide-simeth (MAALOX/MYLANTA) 200-200-20 MG/5ML suspension 30 mL  30 mL Oral Q6H PRN Starkes-Perry, Juel Burrowakia S, FNP      . cloNIDine (CATAPRES) tablet 0.1 mg  0.1 mg Oral BH-qamhs Nwoko, Agnes I, NP   0.1 mg at 02/14/18 [id number]   Followed by  . [START ON 02/16/2018] cloNIDine (CATAPRES) tablet 0.1 mg  0.1 mg Oral QAC breakfast Nwoko, Agnes I, NP      . dicyclomine (BENTYL) tablet 20 mg  20 mg Oral Q6H PRN Nwoko, Agnes I, NP      . hydrOXYzine (ATARAX/VISTARIL) tablet 100 mg  100 mg Oral TID PRN Maryagnes AmosStarkes-Perry, Takia S, FNP   100 mg at 02/13/18 2141  . levETIRAcetam (KEPPRA) tablet 1,000 mg  1,000 mg Oral BID Maryagnes AmosStarkes-Perry, Takia S, FNP   1,000 mg at 02/14/18 0829  . magnesium hydroxide (MILK OF MAGNESIA) suspension 30 mL  30 mL Oral Daily PRN Starkes-Perry, Juel Burrowakia S, FNP      . methocarbamol (ROBAXIN) tablet 500 mg  500 mg Oral Q8H PRN Armandina StammerNwoko, Agnes I, NP   500 mg at 02/13/18 2141  . naproxen (NAPROSYN) tablet 500 mg  500 mg Oral BID PRN Armandina StammerNwoko, Agnes I, NP   500 mg at 02/13/18 2141  . neomycin-bacitracin-polymyxin (NEOSPORIN) ointment   Topical TID Antonieta Pertlary, Greg Lawson, MD      . nicotine polacrilex (NICORETTE) gum 2 mg  2 mg Oral PRN Armandina StammerNwoko, Agnes I, NP      . ondansetron (ZOFRAN) tablet 8 mg  8 mg Oral Q8H PRN Antonieta Pertlary, Greg Lawson, MD      . sertraline (ZOLOFT)  tablet 25 mg  25 mg Oral Daily Antonieta Pertlary, Greg Lawson, MD   25 mg at 02/14/18 [id number]  . traZODone (DESYREL) tablet 100 mg  100 mg Oral QHS PRN,MR X 1 Nira ConnBerry, Jason A, NP   100 mg at 02/13/18 2141    Lab Results:  Results for orders placed or performed during the  hospital encounter of 02/12/18 (from the past 48 hour(s))  MRSA PCR Screening     Status: None   Collection Time: 02/12/18  3:32 PM  Result Value Ref Range   MRSA by PCR NEGATIVE NEGATIVE    Comment:        The GeneXpert MRSA Assay (FDA approved for NASAL specimens only), is one component of a comprehensive MRSA colonization surveillance program. It is not intended to diagnose MRSA infection nor to guide or monitor treatment for MRSA infections. Performed at [facility], [address], [place], [place] [address]   Hepatitis panel, acute     Status: Abnormal   Collection Time: 02/12/18  6:47 PM  Result Value Ref Range   Hepatitis B Surface Ag Negative Negative   HCV Ab >11.0 (H) 0.0 - 0.9 s/co ratio    Comment: (NOTE)                                  Negative:     < 0.8                             Indeterminate: 0.8 - 0.9                                  Positive:     > 0.9 The CDC recommends that a positive HCV antibody result be followed up with a HCV Nucleic Acid Amplification test (027253). Performed At: [facility] [address] [place], [place] [id number] Jolene Schimke MD [id number]    Hep A IgM Negative Negative   Hep B C IgM Negative Negative  Vitamin B12     Status: None   Collection Time: 02/12/18  6:47 PM  Result Value Ref Range   Vitamin B-12 349 180 - 914 pg/mL    Comment: (NOTE) This assay is not validated for testing neonatal or myeloproliferative syndrome specimens for Vitamin B12 levels. Performed at [facility], [address] [place], [place] [address]   Folate     Status: None   Collection Time: 02/12/18  6:47 PM  Result Value Ref Range    Folate 16.4 >5.9 ng/mL    Comment: Performed at [facility] [address] [place], [place] [address]  Iron and TIBC     Status: Abnormal   Collection Time: 02/12/18  6:47 PM  Result Value Ref Range   Iron 12 (L) 28 - 170 ug/dL   TIBC 606 301 - 601 ug/dL   Saturation Ratios 3 (L) 10.4 - 31.8 %   UIBC 335 ug/dL    Comment: Performed at [facility], [address] [place], [place] [address]  Ferritin     Status: Abnormal   Collection Time: 02/12/18  6:47 PM  Result Value Ref Range   Ferritin 5 (L) 11 - 307 ng/mL    Comment: Performed at [facility], [address] [place], [place] [address]  Reticulocytes     Status: Abnormal   Collection Time: 02/12/18  6:47 PM  Result Value Ref Range   Retic Ct Pct 2.1 0.4 - 3.1 %   RBC. 3.39 (L) 3.87 - 5.11 MIL/uL   Retic Count, Absolute 83.0 19.0 - 186.0 K/uL   Immature Retic Fract 13.9 2.3 - 15.9 %  Comment: Performed at [facility], [address], [place], [place] [address]  HIV-1 RNA, Qualitative, TMA     Status: None   Collection Time: 02/12/18  6:47 PM  Result Value Ref Range   HIV-1 RNA, Qualitative, TMA Negative Negative    Comment: (NOTE) Negative for HIV-1 RNA Performed At: [facility] [address] [place], [place] [id number] Jolene Schimke MD [id number]   Hemoglobin A1c     Status: None   Collection Time: 02/13/18  6:26 AM  Result Value Ref Range   Hgb A1c MFr Bld 5.1 4.8 - 5.6 %    Comment: (NOTE) Pre diabetes:          5.7%-6.4% Diabetes:              >6.4% Glycemic control for   <7.0% adults with diabetes    Mean Plasma Glucose 99.67 mg/dL    Comment: Performed at [facility], [address], [place], [place] [address]  Lipid panel     Status: Abnormal   Collection Time: 02/13/18  6:26 AM  Result Value Ref Range   Cholesterol 108 0 - 200 mg/dL   Triglycerides 993 <570 mg/dL   HDL 22 (L) >17 mg/dL   Total  CHOL/HDL Ratio 4.9 RATIO   VLDL 22 0 - 40 mg/dL   LDL Cholesterol 64 0 - 99 mg/dL    Comment:        Total Cholesterol/HDL:CHD Risk Coronary Heart Disease Risk Table                     Men   Women  1/2 Average Risk   3.4   3.3  Average Risk       5.0   4.4  2 X Average Risk   9.6   7.1  3 X Average Risk  23.4   11.0        Use the calculated Patient Ratio above and the CHD Risk Table to determine the patient's CHD Risk.        ATP III CLASSIFICATION (LDL):  <100     mg/dL   Optimal  793-903  mg/dL   Near or Above                    Optimal  130-159  mg/dL   Borderline  009-233  mg/dL   High  >007     mg/dL   Very High Performed at [facility], [address], [place], [place] [address]   TSH     Status: None   Collection Time: 02/13/18  6:26 AM  Result Value Ref Range   TSH 3.311 0.350 - 4.500 uIU/mL    Comment: Performed by a 3rd Generation assay with a functional sensitivity of <=0.01 uIU/mL. Performed at [facility], [address] [place], [place] [address]     Blood Alcohol level:  Lab Results  Component Value Date   [facility] <10 02/12/2018   ETH <10 01/28/2018    Metabolic Disorder Labs: Lab Results  Component Value Date   HGBA1C 5.1 02/13/2018   MPG 99.67 02/13/2018   No results found for: PROLACTIN Lab Results  Component Value Date   CHOL 108 02/13/2018   TRIG 110 02/13/2018   HDL 22 (L) 02/13/2018   CHOLHDL 4.9 02/13/2018   VLDL 22 02/13/2018   LDLCALC 64 02/13/2018    Physical Findings: AIMS: Facial and Oral Movements Muscles of Facial  Expression: None, normal Lips and Perioral Area: None, normal Jaw: None, normal Tongue: None, normal,Extremity Movements Upper (arms, wrists, hands, fingers): None, normal Lower (legs, knees, ankles, toes): None, normal, Trunk Movements Neck, shoulders, hips: None, normal, Overall Severity Severity of abnormal movements (highest score from questions above): None,  normal Incapacitation due to abnormal movements: None, normal, Dental Status Current problems with teeth and/or dentures?: Yes Does patient usually wear dentures?: No  CIWA:    COWS:  COWS Total Score: 5  Musculoskeletal: Strength & Muscle Tone: within normal limits Gait & Station: normal Patient leans: N/A  Psychiatric Specialty Exam: Physical Exam  Nursing note and vitals reviewed. Constitutional: She is oriented to person, place, and time. She appears well-developed.  Cardiovascular: Normal rate.  Neurological: She is alert and oriented to person, place, and time.  Psychiatric: She has a normal mood and affect. Her behavior is normal.    Review of Systems  Constitutional: Positive for diaphoresis and malaise/fatigue.  Gastrointestinal: Positive for abdominal pain (cramps), nausea and vomiting (x1 this morning).  Musculoskeletal: Positive for myalgias.  Neurological: Positive for headaches.  Psychiatric/Behavioral: Positive for depression and substance abuse (heroin detox). Negative for hallucinations, memory loss and suicidal ideas. The patient is not nervous/anxious and does not have insomnia.   All other systems reviewed and are negative.   Blood pressure (!) 94/57, pulse 94, temperature 97.8 F (36.6 C), resp. rate 16, height 4\' 11"  (1.499 m), weight 53.1 kg, last menstrual period 02/05/2018, SpO2 98 %.Body mass index is 23.63 kg/m.  General Appearance: Disheveled and Guarded  Eye Contact:  Good  Speech:  Clear and Coherent  Volume:  Normal  Mood:  Dysphoric  Affect:  Congruent  Thought Process:  Coherent  Orientation:  Full (Time, Place, and Person)  Thought Content:  WDL  Suicidal Thoughts:  No  Homicidal Thoughts:  No  Memory:  Immediate;   Good  Judgement:  Fair  Insight:  Fair  Psychomotor Activity:  Normal  Concentration:  Concentration: Good and Attention Span: Good  Recall:  Good  Fund of Knowledge:  Fair  Language:  Good  Akathisia:  No  Handed:   Right  AIMS (if indicated):     Assets:  Communication Skills Desire for Improvement  ADL's:  Intact  Cognition:  WNL  Sleep:  Number of Hours: 6.25     Treatment Plan Summary: Daily contact with patient to assess and evaluate symptoms and progress in treatment and Medication management   Continue with current treatment plan on 02/14/2018 as listed below except where noted   Heroin detox:  Continue COWS clonidine protocol  Moodstablization:   Increased  Zoloft 25 mg to 50 mg  PO daily  Other medical:  Continue Keppra 1000 mg PO BID for seizure disorder    Patient will participate in the therapeutic group milieu. Discharge disposition in progress.   Oneta Rackanika N Lui Bellis, NP 02/14/2018, 11:03 AM

## 2018-02-14 NOTE — Plan of Care (Signed)
  Problem: Education: Goal: Knowledge of [facility] Education information/materials will improve Outcome: Progressing   

## 2018-02-14 NOTE — Progress Notes (Addendum)
D Pt is observed OOB UAL on the 300 hall today she tolerates this poorl;y. She endorses a flat, depressed affect. SHe avoids making and keeeping eye contact. She is quite depressed and emotionally  stuck. She does take her scheduked meds as planned. She wears hospital-issue patient scrubs, her hair is messed up and she has dirty fingernails. She says " I feel so bad and my BP is so low..all Ive been able to do is   been able to do is lay in the be d ".     A SHe completed her daily assessment and on this she wrote she denied experiencing suicidal ideation today and she rated her depression, hopelessness and anxeity " 8/8/8/", respectively.      R Safety is in place and poc cont.

## 2018-02-14 NOTE — [facility] Group Notes (Signed)
[facility] Group Notes:  (Nursing)  Date:  02/14/2018  Time:  1:15 Pm Type of Therapy:  Nurse Education  Participation Level:  Did Not Attend  Shela Nevin 02/14/2018, 8:14 PM

## 2018-02-15 MED ORDER — MIRTAZAPINE 15 MG PO TABS
15.0000 mg | ORAL_TABLET | Freq: Once | ORAL | Status: AC
  Administered 2018-02-15: 15 mg via ORAL
  Filled 2018-02-15 (×2): qty 1

## 2018-02-15 NOTE — Progress Notes (Signed)
Patient did attend the evening speaker AA meeting.  

## 2018-02-15 NOTE — Progress Notes (Signed)
D. Pt presents with a flat, sad affect, depressed mood. Pt remains calm and cooperative, but continues to isolate to her room- complaining of mild withdrawal symptoms ( mild nausea, achy, anxiety).  Pt currently denies SI/HI and AV hallucinations. A. Labs and vitals monitored. Pt compliant with medications. Pt supported emotionally and encouraged to express concerns and ask questions.   R. Pt remains safe with 15 minute checks. Will continue POC.

## 2018-02-15 NOTE — [facility] Group Notes (Signed)
[facility] Group Notes:  (Nursing)  Date:  02/15/2018  Time:1:30 PM Type of Therapy:  Nurse Education  Participation Level:  Did Not Attend   Shela Nevin 02/15/2018, 5:31 PM

## 2018-02-15 NOTE — [facility] Group Notes (Signed)
[facility] Group Notes: (Clinical Social Work)   02/15/2018      Type of Therapy:  Group Therapy   Participation Level:  Did Not Attend despite MHT prompting   Vertis Scheib Grossman-Orr, LCSW 02/15/2018, 11:52 AM          

## 2018-02-15 NOTE — Progress Notes (Signed)
[facility] MD Progress Note  02/15/2018 10:18 AM [name]  MRN:  [id number]  Evaluation: [name] seen standing at the nurses station awaiting medication administration.  Continues to present flat guarded and depressed.  Patient reports she is hopeful to attend a long-term treatment facility.  Julie Sanford initially denied previous admission, [facility]  Per social worker patient is awaiting [facility] referral.  Patient denies suicidal homicidal ideations.  Denies auditory or visual hallucinations.  Rates her depression 8 out of 10 with 10 being the worst, NP increase Zoloft 25 mg to 50 mg we will continue to monitor. Julie Sanford continues to report detox symptoms from heroin and not feeling well.  Patient continues to isolate to her room.  Patient was encouraged to attend daily group sessions, patient appeared to be receptive and agreeable to treatment plan.  Reports a good appetite.  States she is resting well throughout the night.  Support encouragement and reassurance was provided.    History: Per assessment note:  Julie Sanford is a single 38 year old female with history of heroin and methamphetamine use, depression, bipolar disorder, and seizures who presents for treatment of heroin overdose. Reports that she purposely overdosed as a suicide attempt. Her family administered Narcan and EMS was called. She reports heroin use since 2009, with longest period of sobriety 6 months. She relapsed a week ago and has been using daily. Also reports intermittent meth use, with last use 2-3 weeks ago but states "Heroin is my drug of choice." Extensive scabbing across body- "I pick when I get anxious." UDS positive for opioids only. Her stepfather kicked her out of the home yesterday; her three children remain with her mother and stepfather. Reports the holidays are hard for her after her 36-year-old passed away two years ago. Reports depressed mood for several weeks with decreased sleep,  fatigue, diminished appetite, and hopelessness. Denies SI, HI, AVH.   Principal Problem: Heroin dependence (HCC) Diagnosis: Principal Problem:   Heroin dependence (HCC) Active Problems:   MDD (major depressive disorder), recurrent severe, without psychosis (HCC)  Total Time spent with patient: 15 minutes  Past Psychiatric History: See admission H&P  Past Medical History:  Past Medical History:  Diagnosis Date  . Bipolar 1 disorder (HCC)   . Depression   . Herpes   . Seizures (HCC)     Past Surgical History:  Procedure Laterality Date  . CESAREAN SECTION     Family History:  Family History  Problem Relation Age of Onset  . Diabetes Sister    Family Psychiatric  History: See admission H&P Social History:  Social History   Substance and Sexual Activity  Alcohol Use No     Social History   Substance and Sexual Activity  Drug Use Yes  . Types: Heroin   Comment: was clean for 6 mths, using x 1 week daily    Social History   Socioeconomic History  . Marital status: Legally Separated    Spouse name: Not on file  . Number of children: Not on file  . Years of education: Not on file  . Highest education level: Not on file  Occupational History  . Not on file  Social Needs  . Financial resource strain: Not on file  . Food insecurity:    Worry: Not on file    Inability: Not on file  . Transportation needs:    Medical: Not on file    Non-medical: Not on file  Tobacco Use  . Smoking  status: Current Every Day Smoker    Packs/day: 2.00    Types: Cigarettes  . Smokeless tobacco: Never Used  Substance and Sexual Activity  . Alcohol use: No  . Drug use: Yes    Types: Heroin    Comment: was clean for 6 mths, using x 1 week daily  . Sexual activity: Never    Comment: heroin  Lifestyle  . Physical activity:    Days per week: Not on file    Minutes per session: Not on file  . Stress: Not on file  Relationships  . Social connections:    Talks on phone: Not on  file    Gets together: Not on file    Attends religious service: Not on file    Active member of club or organization: Not on file    Attends meetings of clubs or organizations: Not on file    Relationship status: Not on file  Other Topics Concern  . Not on file  Social History Narrative  . Not on file   Additional Social History:                         Sleep: Fair  Appetite:  Fair  Current Medications: Current Facility-Administered Medications  Medication Dose Route Frequency Provider Last Rate Last Dose  . acetaminophen (TYLENOL) tablet 650 mg  650 mg Oral Q4H PRN Maryagnes AmosStarkes-Perry, Takia S, FNP      . albuterol (PROVENTIL HFA;VENTOLIN HFA) 108 (90 Base) MCG/ACT inhaler 2 puff  2 puff Inhalation Q4H PRN Rosario AdieStarkes-Perry, Juel Burrowakia S, FNP      . alum & mag hydroxide-simeth (MAALOX/MYLANTA) 200-200-20 MG/5ML suspension 30 mL  30 mL Oral Q6H PRN Starkes-Perry, Juel Burrowakia S, FNP      . cloNIDine (CATAPRES) tablet 0.1 mg  0.1 mg Oral BH-qamhs Nwoko, Agnes I, NP   0.1 mg at 02/15/18 0850   Followed by  . [START ON 02/16/2018] cloNIDine (CATAPRES) tablet 0.1 mg  0.1 mg Oral QAC breakfast Nwoko, Agnes I, NP      . dicyclomine (BENTYL) tablet 20 mg  20 mg Oral Q6H PRN Armandina StammerNwoko, Agnes I, NP   20 mg at 02/14/18 2143  . hydrOXYzine (ATARAX/VISTARIL) tablet 100 mg  100 mg Oral TID PRN Maryagnes AmosStarkes-Perry, Takia S, FNP   100 mg at 02/14/18 2143  . levETIRAcetam (KEPPRA) tablet 1,000 mg  1,000 mg Oral BID Maryagnes AmosStarkes-Perry, Takia S, FNP   1,000 mg at 02/15/18 0843  . magnesium hydroxide (MILK OF MAGNESIA) suspension 30 mL  30 mL Oral Daily PRN Starkes-Perry, Juel Burrowakia S, FNP      . methocarbamol (ROBAXIN) tablet 500 mg  500 mg Oral Q8H PRN Armandina StammerNwoko, Agnes I, NP   500 mg at 02/14/18 1646  . naproxen (NAPROSYN) tablet 500 mg  500 mg Oral BID PRN Armandina StammerNwoko, Agnes I, NP   500 mg at 02/15/18 0847  . neomycin-bacitracin-polymyxin (NEOSPORIN) ointment   Topical TID Antonieta Pertlary, Greg Lawson, MD      . nicotine polacrilex (NICORETTE) gum 2  mg  2 mg Oral PRN Armandina StammerNwoko, Agnes I, NP      . ondansetron (ZOFRAN) tablet 8 mg  8 mg Oral Q8H PRN Antonieta Pertlary, Greg Lawson, MD      . sertraline (ZOLOFT) tablet 50 mg  50 mg Oral Daily Oneta RackLewis, Gemini Bunte N, NP   50 mg at 02/15/18 0842  . traZODone (DESYREL) tablet 100 mg  100 mg Oral QHS PRN,MR X 1 Jackelyn PolingBerry, Jason A, NP  100 mg at 02/14/18 2143    Lab Results:  No results found for this or any previous visit (from the past 48 hour(s)).  Blood Alcohol level:  Lab Results  Component Value Date   ETH <10 02/12/2018   ETH <10 01/28/2018    Metabolic Disorder Labs: Lab Results  Component Value Date   HGBA1C 5.1 02/13/2018   MPG 99.67 02/13/2018   No results found for: PROLACTIN Lab Results  Component Value Date   CHOL 108 02/13/2018   TRIG 110 02/13/2018   HDL 22 (L) 02/13/2018   CHOLHDL 4.9 02/13/2018   VLDL 22 02/13/2018   LDLCALC 64 02/13/2018    Physical Findings: AIMS: Facial and Oral Movements Muscles of Facial Expression: None, normal Lips and Perioral Area: None, normal Jaw: None, normal Tongue: None, normal,Extremity Movements Upper (arms, wrists, hands, fingers): None, normal Lower (legs, knees, ankles, toes): None, normal, Trunk Movements Neck, shoulders, hips: None, normal, Overall Severity Severity of abnormal movements (highest score from questions above): None, normal Incapacitation due to abnormal movements: None, normal, Dental Status Current problems with teeth and/or dentures?: Yes Does patient usually wear dentures?: No  CIWA:    COWS:  COWS Total Score: 6  Musculoskeletal: Strength & Muscle Tone: within normal limits Gait & Station: normal Patient leans: N/A  Psychiatric Specialty Exam: Physical Exam  Nursing note and vitals reviewed. Constitutional: She is oriented to person, place, and time. She appears well-developed.  Cardiovascular: Normal rate.  Neurological: She is alert and oriented to person, place, and time.  Psychiatric: She has a normal mood  and affect. Her behavior is normal.    Review of Systems  Gastrointestinal: Positive for abdominal pain (cramps- improving ) and nausea (improving ). Negative for vomiting (x1 this morning).  Musculoskeletal: Negative for myalgias.  Neurological: Positive for headaches.  Psychiatric/Behavioral: Positive for depression and substance abuse (heroin detox). Negative for hallucinations, memory loss and suicidal ideas. The patient is not nervous/anxious and does not have insomnia.   All other systems reviewed and are negative.   Blood pressure (!) 104/58, pulse 88, temperature 98.3 F (36.8 C), resp. rate 20, height 4\' 11"  (1.499 m), weight 53.1 kg, last menstrual period 02/05/2018, SpO2 98 %.Body mass index is 23.63 kg/m.  General Appearance: Disheveled and Guarded  Eye Contact:  Good  Speech:  Clear and Coherent  Volume:  Normal  Mood:  Dysphoric  Affect:  Congruent  Thought Process:  Coherent  Orientation:  Full (Time, Place, and Person)  Thought Content:  WDL  Suicidal Thoughts:  No  Homicidal Thoughts:  No  Memory:  Immediate;   Good  Judgement:  Fair  Insight:  Fair  Psychomotor Activity:  Normal  Concentration:  Concentration: Good and Attention Span: Good  Recall:  Good  Fund of Knowledge:  Fair  Language:  Good  Akathisia:  No  Handed:  Right  AIMS (if indicated):     Assets:  Communication Skills Desire for Improvement  ADL's:  Intact  Cognition:  WNL  Sleep:  Number of Hours: 6.25     Treatment Plan Summary: Daily contact with patient to assess and evaluate symptoms and progress in treatment and Medication management   Continue with current treatment plan on 02/15/2018 as listed below except where noted   Heroin detox:  Continue COWS clonidine protocol  Moodstablization:   Continue Zoloft 50 mg  PO daily  Other medical:  Continue Keppra 1000 mg PO BID for seizure disorder    Patient will  participate in the therapeutic group milieu. Discharge disposition  in progress.   Oneta Rack, NP 02/15/2018, 10:18 AM

## 2018-02-15 NOTE — Progress Notes (Signed)
D: Pt denies SI/HI/AVH. Pt slept 10 hrs today. Pt had minimal interaction on the unit this evening.   A: Pt was offered support and encouragement. Pt was given scheduled medications. Pt was encourage to attend groups. Q 15 minute checks were done for safety.   R: safety maintained on unit.  Problem: Safety: Goal: Periods of time without injury will increase Outcome: Progressing

## 2018-02-16 MED ORDER — GABAPENTIN 300 MG PO CAPS
300.0000 mg | ORAL_CAPSULE | Freq: Three times a day (TID) | ORAL | Status: DC
  Administered 2018-02-16 – 2018-02-18 (×6): 300 mg via ORAL
  Filled 2018-02-16 (×9): qty 1

## 2018-02-16 MED ORDER — KETOROLAC TROMETHAMINE 60 MG/2ML IM SOLN
60.0000 mg | Freq: Once | INTRAMUSCULAR | Status: AC
  Administered 2018-02-16: 60 mg via INTRAMUSCULAR
  Filled 2018-02-16 (×2): qty 2

## 2018-02-16 MED ORDER — LORAZEPAM 1 MG PO TABS
1.0000 mg | ORAL_TABLET | Freq: Once | ORAL | Status: AC
  Administered 2018-02-16: 1 mg via ORAL
  Filled 2018-02-16: qty 1

## 2018-02-16 MED ORDER — CHLORDIAZEPOXIDE HCL 5 MG PO CAPS
10.0000 mg | ORAL_CAPSULE | Freq: Three times a day (TID) | ORAL | Status: AC
  Administered 2018-02-16 – 2018-02-17 (×5): 10 mg via ORAL
  Filled 2018-02-16 (×5): qty 2

## 2018-02-16 MED ORDER — MIRTAZAPINE 30 MG PO TBDP
30.0000 mg | ORAL_TABLET | Freq: Every day | ORAL | Status: DC
  Administered 2018-02-16 – 2018-02-17 (×2): 30 mg via ORAL
  Filled 2018-02-16: qty 1
  Filled 2018-02-16: qty 2
  Filled 2018-02-16 (×2): qty 1

## 2018-02-16 MED ORDER — KETOROLAC TROMETHAMINE 30 MG/ML IJ SOLN
30.0000 mg | Freq: Once | INTRAMUSCULAR | Status: AC
  Administered 2018-02-16: 30 mg via INTRAMUSCULAR
  Filled 2018-02-16 (×2): qty 1

## 2018-02-16 MED ORDER — METHOCARBAMOL 500 MG PO TABS
1000.0000 mg | ORAL_TABLET | Freq: Three times a day (TID) | ORAL | Status: DC
  Administered 2018-02-17: 1000 mg via ORAL
  Filled 2018-02-16 (×9): qty 2

## 2018-02-16 NOTE — Progress Notes (Signed)
Nursing Note: 0700-1900  D:  Pt presents with irritable and anxious mood, affect is labile.  C/o severe leg pain all night, "I was miserable and couldn't sleep all night!"  Administered Toradol 60mg  IM today per order.  Pt verbalized that this medication is helpful for pain also requesting Remeron for nighttime which was ordered.  Pt mostly stayed in room today but intermittently in hall for phone call time.  Pt refusing to take Robaxin stating it causes worse leg cramps.  A:  Encouraged to verbalize needs and concerns, active listening and support provided.  Continued Q 15 minute safety checks.  Pt receiving withdrawal meds as ordered.  R:  Pt. irritable throughout shift but significantly less agitated that night shift.  Denies A/V hallucinations and is able to verbally contract for safety. Reports poor appetite, she has not left unit for meals or activities. No participation in group activities.

## 2018-02-16 NOTE — Progress Notes (Signed)
[facility] MD Progress Note  02/16/2018 9:40 AM Julie Sanford  MRN:  [id number] Subjective:    Patient seen in her room she remains intensely uncomfortable suffering from withdrawal symptoms cramping craving restlessness believes some of the medications may be exacerbating her withdrawal.  She does not have thoughts of harming herself but can contract here only her mood is anxious and depressed no psychosis.  Principal Problem: Heroin dependence (HCC) Diagnosis: Principal Problem:   Heroin dependence (HCC) Active Problems:   MDD (major depressive disorder), recurrent severe, without psychosis (HCC)  Total Time spent with patient: 30 minutes Past Medical History:  Past Medical History:  Diagnosis Date  . Bipolar 1 disorder (HCC)   . Depression   . Herpes   . Seizures (HCC)     Past Surgical History:  Procedure Laterality Date  . CESAREAN SECTION     Family History:  Family History  Problem Relation Age of Onset  . Diabetes Sister     Social History:  Social History   Substance and Sexual Activity  Alcohol Use No     Social History   Substance and Sexual Activity  Drug Use Yes  . Types: Heroin   Comment: was clean for 6 mths, using x 1 week daily    Social History   Socioeconomic History  . Marital status: Legally Separated    Spouse name: Not on file  . Number of children: Not on file  . Years of education: Not on file  . Highest education level: Not on file  Occupational History  . Not on file  Social Needs  . Financial resource strain: Not on file  . Food insecurity:    Worry: Not on file    Inability: Not on file  . Transportation needs:    Medical: Not on file    Non-medical: Not on file  Tobacco Use  . Smoking status: Current Every Day Smoker    Packs/day: 2.00    Types: Cigarettes  . Smokeless tobacco: Never Used  Substance and Sexual Activity  . Alcohol use: No  . Drug use: Yes    Types: Heroin    Comment: was clean for 6 mths, using x 1 week  daily  . Sexual activity: Never    Comment: heroin  Lifestyle  . Physical activity:    Days per week: Not on file    Minutes per session: Not on file  . Stress: Not on file  Relationships  . Social connections:    Talks on phone: Not on file    Gets together: Not on file    Attends religious service: Not on file    Active member of club or organization: Not on file    Attends meetings of clubs or organizations: Not on file    Relationship status: Not on file  Other Topics Concern  . Not on file  Social History Narrative  . Not on file  Her sleep was very poor 1.5 hours  Appetite:  Fair  Current Medications: Current Facility-Administered Medications  Medication Dose Route Frequency Provider Last Rate Last Dose  . acetaminophen (TYLENOL) tablet 650 mg  650 mg Oral Q4H PRN Maryagnes Amos, FNP   650 mg at 02/15/18 2301  . albuterol (PROVENTIL HFA;VENTOLIN HFA) 108 (90 Base) MCG/ACT inhaler 2 puff  2 puff Inhalation Q4H PRN Rosario Adie, Juel Burrow, FNP      . alum & mag hydroxide-simeth (MAALOX/MYLANTA) 200-200-20 MG/5ML suspension 30 mL  30 mL Oral Q6H PRN  Maryagnes AmosStarkes-Perry, Takia S, FNP      . chlordiazePOXIDE (LIBRIUM) capsule 10 mg  10 mg Oral TID Malvin JohnsFarah, Riku Buttery, MD      . cloNIDine (CATAPRES) tablet 0.1 mg  0.1 mg Oral QAC breakfast Armandina StammerNwoko, Agnes I, NP   0.1 mg at 02/16/18 [id number]  . dicyclomine (BENTYL) tablet 20 mg  20 mg Oral Q6H PRN Armandina StammerNwoko, Agnes I, NP   20 mg at 02/15/18 1541  . gabapentin (NEURONTIN) capsule 300 mg  300 mg Oral TID Malvin JohnsFarah, Hubbert Landrigan, MD      . hydrOXYzine (ATARAX/VISTARIL) tablet 100 mg  100 mg Oral TID PRN Maryagnes AmosStarkes-Perry, Takia S, FNP   100 mg at 02/15/18 1642  . ketorolac (TORADOL) injection 60 mg  60 mg Intramuscular Once Malvin JohnsFarah, Brier Reid, MD      . levETIRAcetam (KEPPRA) tablet 1,000 mg  1,000 mg Oral BID Maryagnes AmosStarkes-Perry, Takia S, FNP   1,000 mg at 02/16/18 0820  . magnesium hydroxide (MILK OF MAGNESIA) suspension 30 mL  30 mL Oral Daily PRN Maryagnes AmosStarkes-Perry, Takia S, FNP       . methocarbamol (ROBAXIN) tablet 1,000 mg  1,000 mg Oral TID Malvin JohnsFarah, Richie Bonanno, MD      . naproxen (NAPROSYN) tablet 500 mg  500 mg Oral BID PRN Armandina StammerNwoko, Agnes I, NP   500 mg at 02/15/18 2008  . neomycin-bacitracin-polymyxin (NEOSPORIN) ointment   Topical TID Antonieta Pertlary, Greg Lawson, MD      . nicotine polacrilex (NICORETTE) gum 2 mg  2 mg Oral PRN Armandina StammerNwoko, Agnes I, NP      . ondansetron (ZOFRAN) tablet 8 mg  8 mg Oral Q8H PRN Antonieta Pertlary, Greg Lawson, MD   8 mg at 02/15/18 2008  . sertraline (ZOLOFT) tablet 50 mg  50 mg Oral Daily Oneta RackLewis, Tanika N, NP   50 mg at 02/16/18 0820    Lab Results: No results found for this or any previous visit (from the past 48 hour(s)).  Blood Alcohol level:  Lab Results  Component Value Date   ETH <10 02/12/2018   ETH <10 01/28/2018    Metabolic Disorder Labs: Lab Results  Component Value Date   HGBA1C 5.1 02/13/2018   MPG 99.67 02/13/2018   No results found for: PROLACTIN Lab Results  Component Value Date   CHOL 108 02/13/2018   TRIG 110 02/13/2018   HDL 22 (L) 02/13/2018   CHOLHDL 4.9 02/13/2018   VLDL 22 02/13/2018   LDLCALC 64 02/13/2018    Physical Findings: AIMS: Facial and Oral Movements Muscles of Facial Expression: None, normal Lips and Perioral Area: None, normal Jaw: None, normal Tongue: None, normal,Extremity Movements Upper (arms, wrists, hands, fingers): None, normal Lower (legs, knees, ankles, toes): None, normal, Trunk Movements Neck, shoulders, hips: None, normal, Overall Severity Severity of abnormal movements (highest score from questions above): None, normal Incapacitation due to abnormal movements: None, normal, Dental Status Current problems with teeth and/or dentures?: Yes Does patient usually wear dentures?: No  CIWA:    COWS:  COWS Total Score: 13  Musculoskeletal: Strength & Muscle Tone: decreased Gait & Station: shuffle Patient leans: N/A  Psychiatric Specialty Exam: Physical Exam  ROS  Blood pressure 116/83, pulse  71, temperature 98.3 F (36.8 C), resp. rate 20, height 4\' 11"  (1.499 m), weight 53.1 kg, last menstrual period 02/05/2018, SpO2 98 %.Body mass index is 23.63 kg/m.  General Appearance: Casual and Disheveled  Eye Contact:  Good  Speech:  Clear and Coherent  Volume:  Normal  Mood:  Anxious and Dysphoric  Affect:  Appropriate and Congruent  Thought Process:  Descriptions of Associations: Tangential  Orientation:  Full (Time, Place, and Person)  Thought Content:  Logical  Suicidal Thoughts:  No  Homicidal Thoughts:  No  Memory:  Immediate;   Fair  Judgement:  Fair  Insight:  Fair  Psychomotor Activity:  Normal  Concentration:  Concentration: Fair  Recall:  [employer]Fair  Fund of Knowledge:  Fair  Language:  Fair  Akathisia:  Negative  Handed:  Right  AIMS (if indicated):     Assets:  Social Support  ADL's:  Intact  Cognition:  WNL  Sleep:  Number of Hours: 1.25     Treatment Plan Summary: Daily contact with patient to assess and evaluate symptoms and progress in treatment, Medication management and Plan For insomnia we will continue mirtazapine only slept about an hour last night but hopefully when withdrawal symptoms are addressed further she will sleep better continue current precautions add Librium due to just the general discomfort of her withdrawal add Neurontin and Robaxin to help with withdrawal continue cognitive and rehab based therapies  Malvin JohnsFARAH,Lavaya Defreitas, MD 02/16/2018, 9:40 AM

## 2018-02-16 NOTE — Progress Notes (Signed)
D: Patient observed restless on unit. Frequent needs and can be demanding at times. Continues to state, "you all need to do something. I can't take it. My legs are killing me. Can't you send me to the ED? Can't you give me a shot and knock me out?" She complained that she could not sleep however admits to lying in bed all day sleeping on and off. Believes trazadone is making her leg pain, restlessness worse however requested both doses. States remeron will make her sleep but could not remember the strength. "I don't know why they took me off of it."  Patient's affect pained, irritable and anxious, mood labile. Reports generalized pain of a 10/10 which is unremitting even with use of all available prns. Also reports chills, sweats, N/V (however no emesis noted, observed - instructed patient to alert staff). COWS is a "7" and VSS.  Patient became angry when she was not sent to ED or "knocked out with a shot." Slamming doors, disruptive to roommate. Lying in floor. Roommate had to be moved to quiet room (only available clean bed) as patient was too disruptive for roommate to sleep.   A: Medicated per orders, prn zofran, naproxen, robaxin, tylenol, and trazadone x 2  given for complaints. Order was also obtained for remeron x 1 (which was not effective for sleep). As patient escalated, NP consulted. Order received for toradol 30mg  IM x 1 which was given along with ativan 1mg  po x 1. Medication education provided. Level III obs in place for safety. Emotional support offered. Patient encouraged to complete Suicide Safety Plan before discharge. Encouraged to attend and participate in unit programming.  Fall prevention plan in place and reviewed with patient as pt is a high fall risk due to detox, seizure d/o.   R: Patient verbalizes understanding of POC, falls prevention education. On reassess, patient continued to state prns were of no help (however continued to request them). Patient calmer after toradol and ativan  however remains awake at this time. Patient denies SI/HI/AVH and remains safe on level III obs. Will continue to monitor throughout the rest of shift.

## 2018-02-16 NOTE — Progress Notes (Signed)
Patient attended AA group meeting.  

## 2018-02-16 NOTE — Progress Notes (Signed)
Recreation Therapy Notes  Date: 1.6.20 Time: 0930 Location: 300 [address] Dayroom  Group Topic: Stress Management  Goal Area(s) Addresses:  Patient will engage in healthy stress management techniques. Patient will be able to identify positive stress management techniques.  Intervention: Stress Management  Activity : Meditation.  LRT introduced the stress management technique of meditation.  LRT played a meditation that focused on looking at each day as a new beginning.  Patients were to follow along as meditation was played in order to engage in activity.  Education:  Stress Management, Discharge Planning.   Education Outcome: Acknowledges Education  Clinical Observations/Feedback: Pt did not attend group.    Caroll Rancher, LRT/CTRS         Lillia Abed, Meleena Munroe A 02/16/2018 11:01 AM

## 2018-02-17 MED ORDER — KETOROLAC TROMETHAMINE 60 MG/2ML IM SOLN
60.0000 mg | Freq: Once | INTRAMUSCULAR | Status: AC
  Administered 2018-02-17: 60 mg via INTRAMUSCULAR
  Filled 2018-02-17 (×2): qty 2

## 2018-02-17 MED ORDER — NAPROXEN 500 MG PO TABS
500.0000 mg | ORAL_TABLET | Freq: Two times a day (BID) | ORAL | Status: DC | PRN
  Administered 2018-02-17 – 2018-02-18 (×2): 500 mg via ORAL
  Filled 2018-02-17 (×2): qty 1

## 2018-02-17 MED ORDER — NICOTINE 21 MG/24HR TD PT24
21.0000 mg | MEDICATED_PATCH | Freq: Every day | TRANSDERMAL | Status: DC
  Administered 2018-02-17 – 2018-02-18 (×2): 21 mg via TRANSDERMAL
  Filled 2018-02-17 (×4): qty 1

## 2018-02-17 NOTE — Progress Notes (Signed)
Pt observed in the hallway, seen pacing. Pt appears flat/irritable in affect and mood. Pt denies SI/HI/AVH at this time. Pt rates ongoing pain unrelieved by intervention. Pt remains preoccupied with somatic c/o of lower back pain. Pt states "Tylenol doesn't do anything". Pt requesting Naproxen. Provider on call notified; See MAR. Pt remains unsatisfied; requesting something else for pain. Pt was worried whether medications will be prescribed to her once d/c. Pt encourage to speak with provider.Support offered. Will continue with POC.

## 2018-02-17 NOTE — Progress Notes (Signed)
Patient did not attend wrap up group. 

## 2018-02-17 NOTE — Progress Notes (Signed)
D: Patient denies SI, HI or AVH. Patient presents as anxious and irritable with multiple physical complaints.  Pt. Medicated as ordered however reports that her symptoms are not much improved and she refuses to take the Robaxin stating it worsens her muscle cramping.  Pt. Visualized in the hall using the phone and was present for the AA meeting.    A: Patient given emotional support from RN. Patient encouraged to come to staff with concerns and/or questions. Patient's medication routine continued. Patient's orders and plan of care reviewed.   R: Patient remains appropriate and cooperative. Will continue to monitor patient q15 minutes for safety.

## 2018-02-17 NOTE — Progress Notes (Signed)
[facility] MD Progress Note  02/17/2018 11:21 AM Julie Sanford  MRN:  [id number] Subjective:    Patient still reports discomfort, protracted withdrawal, malaise, cravings and cramping no thoughts of self-harm.  Rehab options are discussed.  No psychosis or seizure activity of course but still craving  Principal Problem: Heroin dependence (HCC) Diagnosis: Principal Problem:   Heroin dependence (HCC) Active Problems:   MDD (major depressive disorder), recurrent severe, without psychosis (HCC)  Total Time spent with patient: 20 minutes   Past Medical History:  Past Medical History:  Diagnosis Date  . Bipolar 1 disorder (HCC)   . Depression   . Herpes   . Seizures (HCC)     Past Surgical History:  Procedure Laterality Date  . CESAREAN SECTION     Family History:  Family History  Problem Relation Age of Onset  . Diabetes Sister     Social History:  Social History   Substance and Sexual Activity  Alcohol Use No     Social History   Substance and Sexual Activity  Drug Use Yes  . Types: Heroin   Comment: was clean for 6 mths, using x 1 week daily    Social History   Socioeconomic History  . Marital status: Legally Separated    Spouse name: Not on file  . Number of children: Not on file  . Years of education: Not on file  . Highest education level: Not on file  Occupational History  . Not on file  Social Needs  . Financial resource strain: Not on file  . Food insecurity:    Worry: Not on file    Inability: Not on file  . Transportation needs:    Medical: Not on file    Non-medical: Not on file  Tobacco Use  . Smoking status: Current Every Day Smoker    Packs/day: 2.00    Types: Cigarettes  . Smokeless tobacco: Never Used  Substance and Sexual Activity  . Alcohol use: No  . Drug use: Yes    Types: Heroin    Comment: was clean for 6 mths, using x 1 week daily  . Sexual activity: Never    Comment: heroin  Lifestyle  . Physical activity:    Days per week:  Not on file    Minutes per session: Not on file  . Stress: Not on file  Relationships  . Social connections:    Talks on phone: Not on file    Gets together: Not on file    Attends religious service: Not on file    Active member of club or organization: Not on file    Attends meetings of clubs or organizations: Not on file    Relationship status: Not on file  Other Topics Concern  . Not on file  Social History Narrative  . Not on file   Additional Social History:                         Sleep: Good  Appetite:  Good  Current Medications: Current Facility-Administered Medications  Medication Dose Route Frequency Provider Last Rate Last Dose  . acetaminophen (TYLENOL) tablet 650 mg  650 mg Oral Q4H PRN Maryagnes Amos, FNP   650 mg at 02/17/18 0537  . albuterol (PROVENTIL HFA;VENTOLIN HFA) 108 (90 Base) MCG/ACT inhaler 2 puff  2 puff Inhalation Q4H PRN Rosario Adie, Juel Burrow, FNP      . alum & mag hydroxide-simeth (MAALOX/MYLANTA) 200-200-20 MG/5ML suspension 30  mL  30 mL Oral Q6H PRN Maryagnes Amos, FNP      . chlordiazePOXIDE (LIBRIUM) capsule 10 mg  10 mg Oral TID Malvin Johns, MD   10 mg at 02/17/18 1610  . cloNIDine (CATAPRES) tablet 0.1 mg  0.1 mg Oral QAC breakfast Armandina Stammer I, NP   0.1 mg at 02/16/18 9604  . dicyclomine (BENTYL) tablet 20 mg  20 mg Oral Q6H PRN Armandina Stammer I, NP   20 mg at 02/15/18 1541  . gabapentin (NEURONTIN) capsule 300 mg  300 mg Oral TID Malvin Johns, MD   300 mg at 02/17/18 5409  . hydrOXYzine (ATARAX/VISTARIL) tablet 100 mg  100 mg Oral TID PRN Maryagnes Amos, FNP   100 mg at 02/16/18 2149  . ketorolac (TORADOL) injection 60 mg  60 mg Intramuscular Once Malvin Johns, MD      . levETIRAcetam (KEPPRA) tablet 1,000 mg  1,000 mg Oral BID Maryagnes Amos, FNP   1,000 mg at 02/17/18 8119  . magnesium hydroxide (MILK OF MAGNESIA) suspension 30 mL  30 mL Oral Daily PRN Maryagnes Amos, FNP      .  methocarbamol (ROBAXIN) tablet 1,000 mg  1,000 mg Oral TID Malvin Johns, MD   1,000 mg at 02/17/18 1478  . mirtazapine (REMERON SOL-TAB) disintegrating tablet 30 mg  30 mg Oral QHS Malvin Johns, MD   30 mg at 02/16/18 2150  . naproxen (NAPROSYN) tablet 500 mg  500 mg Oral BID PRN Armandina Stammer I, NP   500 mg at 02/16/18 2003  . neomycin-bacitracin-polymyxin (NEOSPORIN) ointment   Topical TID Antonieta Pert, MD      . nicotine polacrilex (NICORETTE) gum 2 mg  2 mg Oral PRN Armandina Stammer I, NP      . ondansetron (ZOFRAN) tablet 8 mg  8 mg Oral Q8H PRN Antonieta Pert, MD   8 mg at 02/15/18 2008  . sertraline (ZOLOFT) tablet 50 mg  50 mg Oral Daily Oneta Rack, NP   50 mg at 02/17/18 2956    Lab Results: No results found for this or any previous visit (from the past 48 hour(s)).  Blood Alcohol level:  Lab Results  Component Value Date   ETH <10 02/12/2018   ETH <10 01/28/2018    Metabolic Disorder Labs: Lab Results  Component Value Date   HGBA1C 5.1 02/13/2018   MPG 99.67 02/13/2018   No results found for: PROLACTIN Lab Results  Component Value Date   CHOL 108 02/13/2018   TRIG 110 02/13/2018   HDL 22 (L) 02/13/2018   CHOLHDL 4.9 02/13/2018   VLDL 22 02/13/2018   LDLCALC 64 02/13/2018    Physical Findings: AIMS: Facial and Oral Movements Muscles of Facial Expression: None, normal Lips and Perioral Area: None, normal Jaw: None, normal Tongue: None, normal,Extremity Movements Upper (arms, wrists, hands, fingers): None, normal Lower (legs, knees, ankles, toes): None, normal, Trunk Movements Neck, shoulders, hips: None, normal, Overall Severity Severity of abnormal movements (highest score from questions above): None, normal Incapacitation due to abnormal movements: None, normal Patient's awareness of abnormal movements (rate only patient's report): No Awareness, Dental Status Current problems with teeth and/or dentures?: Yes(no teeth--endentulous) Does patient  usually wear dentures?: No  CIWA:    COWS:  COWS Total Score: 9  Musculoskeletal: Strength & Muscle Tone: within normal limits Gait & Station: normal Patient leans: N/A  Psychiatric Specialty Exam: Physical Exam  ROS  Blood pressure 109/75, pulse 86, temperature 97.9  F (36.6 C), resp. rate 18, height 4\' 11"  (1.499 m), weight 53.1 kg, last menstrual period 02/05/2018, SpO2 98 %.Body mass index is 23.63 kg/m.  General Appearance: Casual  Eye Contact:  Fair  Speech:  Clear and Coherent  Volume:  Normal  Mood:  Depressed  Affect:  Flat  Thought Process:  Goal Directed  Orientation:  Full (Time, Place, and Person)  Thought Content:  Logical  Suicidal Thoughts:  No  Homicidal Thoughts:  No  Memory:  3/3  Judgement:  Good  Insight:  Good  Psychomotor Activity:  Normal  Concentration:  Concentration: Good  Recall:  Good  Fund of Knowledge:  Good  Language:  Good  Akathisia:  Negative  Handed:  Right  AIMS (if indicated):     Assets:  Communication Skills Resilience  ADL's:  Intact  Cognition:  WNL  Sleep:  Number of Hours: 6.75     Treatment Plan Summary: Daily contact with patient to assess and evaluate symptoms and progress in treatment, Medication management and Plan Continue current detox measures add Toradol x1 probable discharge tomorrow continue rehab and cognitive based groups and current precautions  Markiah Janeway, MD 02/17/2018, 11:21 AM

## 2018-02-17 NOTE — Plan of Care (Signed)
Problem: Safety: Goal: Periods of time without injury will increase Outcome: Progressing   Problem: Medication: Goal: Compliance with prescribed medication regimen will improve Outcome: Progressing DAR Note: Pt A & O X 4. Denies SI, HI and AVH. Presents with blunted affect and irritable mood majority of this shift. Observed in hall on initial contact with continued physical complaints / discomforts "I hurt all over, I need help like right now". Reports fair sleep last night "because I'm hurting all over" with good appetite, low energy and good concentration level. Pt attended groups and participated in activities /discussions. Pt has been selectively compliant with scheduled medications this shift "I don't want the Robaxin "it makes my pain worse".  Emotional support offered. Encouraged pt to attend to ADLs, voice concerns and comply with current treatment regimen including groups. Scheduled and PRN (see EMAR) medications given per order with verbal education and effects monitored. Safety checks maintained on and off unit without incident. Pt receptive to care. Tolerates all meals and fluids well. Denies concerns at this time. Remains safe on and off unit.

## 2018-02-17 NOTE — [facility] Group Notes (Signed)
[facility] [employer] Group Therapy 02/17/2018 1:15pm  Type of Therapy: Mental [employer] Presentation  Participation Level: Active  Participation Quality: Attentive  Affect: Appropriate  Cognitive: Oriented  Insight: Developing/Improving  Engagement in Therapy: Engaged  Modes of Intervention: Discussion, Education and Socialization  Summary of Progress/Problems: Mental [employer] (MHA) Speaker came to talk about his personal journey with mental health. The pt processed ways by which to relate to the speaker. MHA speaker provided handouts and educational information pertaining to groups and services offered by the [facility]. Pt was engaged in speaker's presentation and was receptive to resources provided.    Rona Ravens, LCSW 02/17/2018 4:01 PM

## 2018-02-18 MED ORDER — SERTRALINE HCL 50 MG PO TABS: 50.0000 mg | ORAL_TABLET | Freq: Every day | ORAL | 0 refills | Status: DC

## 2018-02-18 MED ORDER — MIRTAZAPINE 45 MG PO TBDP: 45.0000 mg | ORAL_TABLET | Freq: Every day | ORAL | 0 refills | Status: AC

## 2018-02-18 MED ORDER — ALBUTEROL SULFATE HFA 108 (90 BASE) MCG/ACT IN AERS: 2.0000 | INHALATION_SPRAY | RESPIRATORY_TRACT | Status: AC | PRN

## 2018-02-18 MED ORDER — HYDROXYZINE HCL 50 MG PO TABS: 100.0000 mg | ORAL_TABLET | Freq: Three times a day (TID) | ORAL | 0 refills | Status: AC | PRN

## 2018-02-18 MED ORDER — NICOTINE 21 MG/24HR TD PT24: 21.0000 mg | MEDICATED_PATCH | Freq: Every day | TRANSDERMAL | 0 refills | Status: DC

## 2018-02-18 MED ORDER — GABAPENTIN 300 MG PO CAPS: 300.0000 mg | ORAL_CAPSULE | Freq: Three times a day (TID) | ORAL | 0 refills | Status: AC

## 2018-02-18 MED ORDER — MIRTAZAPINE 15 MG PO TBDP
45.0000 mg | ORAL_TABLET | Freq: Every day | ORAL | Status: DC
  Filled 2018-02-18: qty 3

## 2018-02-18 MED ORDER — LEVETIRACETAM 500 MG PO TABS: 500.0000 mg | ORAL_TABLET | Freq: Two times a day (BID) | ORAL | Status: AC

## 2018-02-18 NOTE — Progress Notes (Signed)
Discharge note: Patient reviewed discharge paperwork with RN including prescriptions, follow up appointments, and lab work. Patient given the opportunity to ask questions. All concerns were addressed. All belongings were returned to patient. Denied SI/HI/AVH. Patient thanked staff for their care while at the hospital.  Patient was discharged to lobby where her friend was picking her up. 

## 2018-02-18 NOTE — Discharge Summary (Signed)
Physician Discharge Summary Note  Patient:  Julie Sanford is an 38 y.o., female  MRN:  [id number]  DOB:  October 24, 1980  Patient phone:  [phone] (home)   Patient address:   [address] [place] [address],   Total Time spent with patient: Greater than 30 minutes  Date of Admission:  02/12/2018  Date of Discharge: 02-18-2018  Reason for Admission: Suicidal ideations.  Principal Problem: Heroin dependence [facility])  Discharge Diagnoses: Principal Problem:   Heroin dependence (HCC) Active Problems:   MDD (major depressive disorder), recurrent severe, without psychosis (HCC)  Past Psychiatric History: See H&P  Past Medical History:  Past Medical History:  Diagnosis Date  . Bipolar 1 disorder (HCC)   . Depression   . Herpes   . Seizures (HCC)     Past Surgical History:  Procedure Laterality Date  . CESAREAN SECTION     Family History:  Family History  Problem Relation Age of Onset  . Diabetes Sister    Family Psychiatric  History: See H&P. Social History:  Social History   Substance and Sexual Activity  Alcohol Use No     Social History   Substance and Sexual Activity  Drug Use Yes  . Types: Heroin   Comment: was clean for 6 mths, using x 1 week daily    Social History   Socioeconomic History  . Marital status: Legally Separated    Spouse name: Not on file  . Number of children: Not on file  . Years of education: Not on file  . Highest education level: Not on file  Occupational History  . Not on file  Social Needs  . Financial resource strain: Not on file  . Food insecurity:    Worry: Not on file    Inability: Not on file  . Transportation needs:    Medical: Not on file    Non-medical: Not on file  Tobacco Use  . Smoking status: Current Every Day Smoker    Packs/day: 2.00    Types: Cigarettes  . Smokeless tobacco: Never Used  Substance and Sexual Activity  . Alcohol use: No  . Drug use: Yes    Types: Heroin    Comment: was clean for  6 mths, using x 1 week daily  . Sexual activity: Never    Comment: heroin  Lifestyle  . Physical activity:    Days per week: Not on file    Minutes per session: Not on file  . Stress: Not on file  Relationships  . Social connections:    Talks on phone: Not on file    Gets together: Not on file    Attends religious service: Not on file    Active member of club or organization: Not on file    Attends meetings of clubs or organizations: Not on file    Relationship status: Not on file  Other Topics Concern  . Not on file  Social History Narrative  . Not on file   Hospital Course: (Per Md's admission evaluation): Patient is a 38 year old female with a past psychiatric history significant for opiate dependence, methamphetamine use disorder, and possible bipolar disorder who presented to the [facility] emergency department on 02/12/2018 with suicidal ideation.  Patient stated that recently she had to deal with the death of a 33-year-old child several years ago, and has continued to struggle with this loss.  She also has a long standing substance abuse issue that had been going well until recently.  She stated  she had been clean for approximately 6 months.  She stated that she and her 3 children have been living with the patient's mother and stepfather.  Unfortunately they asked them to leave after an argument with the stepfather.  The parents are keeping the children.  After that the patient relapsed approximately a week ago.  The patient had presented in earlier in December for admission with similar circumstances, but tested positive for flu and was febrile.  She was instructed to return to the emergency room after she had been afebrile for 36 hours.  Her last documented admission was at [facility] on 08/18/2017.  Review of the electronic medical record showed the complaint at that time was very similar to this.  At that time she admitted to benzodiazepine use disorder as well.  She was  discharged on baclofen, gabapentin, hydroxyzine, Risperdal and trazodone.  She is essentially homeless.  She admitted to helplessness, hopelessness and worthlessness.  She was admitted to the hospital for evaluation and stabilization.  Piedad was admitted to th [facility] adult unit with complaints of worsening symptoms of Bipolar disorder & suicidal ideations. She also has hx of polysubstance use disorders that included opioid drugs. She cited having to start dealing the death of a 71 year old child several years ago as  as the trigger. She was in need of mood stabilization treatments & was recommended as such after her initial admission evaluation.   During the course of her hospitalization, Tiondra was medicated & discharged on; Gabapentin 300 mg for agitation/substance withdrawal syndrome, Vistaril 50 mg prn for anxiety, Mirtazapine 45 mg for depression, Nicotine 21 mg for smoking cessation & Sertraline 50 mg for depression. She was enrolled & participated in the group counseling sessions being offered & held on this unit. She was counseled & learned coping skills that should help her cope better & maintain mood stability after discharge. She was resumed/discharged on all her pertinent home medications for the other previously existing medical issues present. She tolerated her treatment regimen without any adverse effects reported.   While her treatment was on going, Aairah's improvement was monitored by observation & her daily reports of symptom reduction noted.  Her emotional & mental status were monitored by daily self-inventory reports completed by her & the clinical staff. She was evaluated daily by the treatment team for mood stability & the need for continued recovery after discharge. She was offered further treatment options upon discharge & will follow up with the outpatient psychiatric services as listed below.     Upon discharge, Emilee was both mentally & medically stable for discharge.  She is currently denying suicidal, homicidal ideation, auditory, visual/tactile hallucinations, delusional thoughts & or paranoia. She was able to engage in safety planning including plan to return to [facility] or contact emergency services if she feels unable to maintain her own safety or the safety of others. Pt had no further questions, comments or concerns. Jenet left [facility] with all personal belongings in no apparent distress.   Physical Findings: AIMS: Facial and Oral Movements Muscles of Facial Expression: None, normal Lips and Perioral Area: None, normal Jaw: None, normal Tongue: None, normal,Extremity Movements Upper (arms, wrists, hands, fingers): None, normal Lower (legs, knees, ankles, toes): None, normal, Trunk Movements Neck, shoulders, hips: None, normal, Overall Severity Severity of abnormal movements (highest score from questions above): None, normal Incapacitation due to abnormal movements: None, normal Patient's awareness of abnormal movements (rate only patient's report): No Awareness, Dental Status Current problems with  teeth and/or dentures?: Yes(missing ) Does patient usually wear dentures?: No  CIWA:    COWS:  COWS Total Score: 0  Musculoskeletal: Strength & Muscle Tone: within normal limits Gait & Station: normal Patient leans: N/A  Psychiatric Specialty Exam: Physical Exam  Nursing note and vitals reviewed. Constitutional: She is oriented to person, place, and time. She appears well-developed.  HENT:  Head: Normocephalic.  Eyes: Pupils are equal, round, and reactive to light.  Neck: Normal range of motion.  Cardiovascular: Normal rate.  Respiratory: Effort normal.  GI: Soft.  Genitourinary:    Genitourinary Comments: Deferred   Musculoskeletal: Normal range of motion.  Neurological: She is alert and oriented to person, place, and time.  Skin: Skin is warm.    Review of Systems  Constitutional: Negative.   HENT: Negative.   Eyes: Negative.    Respiratory: Negative.  Negative for cough and shortness of breath.   Cardiovascular: Negative.  Negative for chest pain and palpitations.  Gastrointestinal: Negative.  Negative for abdominal pain, heartburn, nausea and vomiting.  Genitourinary: Negative.   Musculoskeletal: Negative.   Skin: Negative.   Neurological: Negative for dizziness and headaches.  Endo/Heme/Allergies: Negative.   Psychiatric/Behavioral: Positive for depression (Stable) and substance abuse (Hx. Polysubstance use disorders.). Negative for hallucinations, memory loss and suicidal ideas. The patient has insomnia (Stable). The patient is not nervous/anxious (Stable).     Blood pressure 119/84, pulse 80, temperature 97.9 F (36.6 C), temperature source Oral, resp. rate 18, height 4\' 11"  (1.499 m), weight 53.1 kg, last menstrual period 02/05/2018, SpO2 98 %.Body mass index is 23.63 kg/m.  See Md's discharge SRA   Have you used any form of tobacco in the last 30 days? (Cigarettes, Smokeless Tobacco, Cigars, and/or Pipes): Yes  Has this patient used any form of tobacco in the last 30 days? (Cigarettes, Smokeless Tobacco, Cigars, and/or Pipes): Yes, an FDA-approved tobacco cessation medication was offered at discharge.  Blood Alcohol level:  Lab Results  Component Value Date   ETH <10 02/12/2018   ETH <10 01/28/2018    Metabolic Disorder Labs:  Lab Results  Component Value Date   HGBA1C 5.1 02/13/2018   MPG 99.67 02/13/2018   No results found for: PROLACTIN Lab Results  Component Value Date   CHOL 108 02/13/2018   TRIG 110 02/13/2018   HDL 22 (L) 02/13/2018   CHOLHDL 4.9 02/13/2018   VLDL 22 02/13/2018   LDLCALC 64 02/13/2018   See Psychiatric Specialty Exam and Suicide Risk Assessment completed by Attending Physician prior to discharge.  Discharge destination:  Home  Is patient on multiple antipsychotic therapies at discharge:  No   Has Patient had three or more failed trials of antipsychotic  monotherapy by history:  No  Recommended Plan for Multiple Antipsychotic Therapies: NA Discharge Instructions    Discharge instructions   Complete by:  As directed    Patient is instructed prior to discharge to: Take all medications as prescribed by his/her mental healthcare provider. Report any adverse effects and or reactions from the medicines to his/her outpatient provider promptly. Patient has been instructed & cautioned: To not engage in alcohol and or illegal drug use while on prescription medicines. In the event of worsening symptoms, patient is instructed to call the crisis hotline, 911 and or go to the nearest ED for appropriate evaluation and treatment of symptoms. To follow-up with his/her primary care provider for your other medical issues, concerns and or health care needs.     Allergies as of  02/18/2018      Reactions   Quetiapine Other (See Comments)   Severe muscle spasms and hallucinations   Tramadol Other (See Comments)   Muscle spasms, seizure and hallucinations   Penicillins Hives   Can't remember, told as a child Has patient had a PCN reaction causing immediate rash, facial/tongue/throat swelling, SOB or lightheadedness with hypotension: no Has patient had a PCN reaction causing severe rash involving mucus membranes or skin necrosis: no Has patient had a PCN reaction that required hospitalization: no Has patient had a PCN reaction occurring within the last 10 years: no If all of the above answers are "NO", then may proceed with Cephalosporin use.      Medication List    STOP taking these medications   ibuprofen 200 MG tablet Commonly known as:  ADVIL,MOTRIN   Melatonin 10 MG Tbcr   NYQUIL HBP COLD & FLU 15-6.25-325 MG/15ML Liqd Generic drug:  DM-Doxylamine-Acetaminophen     TAKE these medications     Indication  albuterol 108 (90 Base) MCG/ACT inhaler Commonly known as:  PROVENTIL HFA;VENTOLIN HFA Inhale 2 puffs into the lungs every 4 (four) hours as  needed for wheezing or shortness of breath.  Indication:  Chronic Obstructive Lung Disease   gabapentin 300 MG capsule Commonly known as:  NEURONTIN Take 1 capsule (300 mg total) by mouth 3 (three) times daily. Agitation  Indication:  Agitation   hydrOXYzine 50 MG tablet Commonly known as:  ATARAX/VISTARIL Take 2 tablets (100 mg total) by mouth 3 (three) times daily as needed for itching or anxiety.  Indication:  Feeling Anxious, Itching   levETIRAcetam 500 MG tablet Commonly known as:  KEPPRA Take 1 tablet (500 mg total) by mouth 2 (two) times daily. For seizures What changed:  additional instructions  Indication:  Seizure   mirtazapine 45 MG disintegrating tablet Commonly known as:  REMERON SOL-TAB Take 1 tablet (45 mg total) by mouth at bedtime. For depression  Indication:  Major Depressive Disorder   nicotine 21 mg/24hr patch Commonly known as:  NICODERM CQ - dosed in mg/24 hours Place 1 patch (21 mg total) onto the skin daily. (May buy from over the counter): For smoking cessation What changed:  additional instructions  Indication:  Nicotine Addiction   sertraline 50 MG tablet Commonly known as:  ZOLOFT Take 1 tablet (50 mg total) by mouth daily. For depression Start taking on:  February 19, 2018  Indication:  Major Depressive Disorder      Follow-up Information    [facility] Follow up.   Specialty:  Addiction Medicine Why:  Referral made 02/13/2018.  Contact information: [address] [place] [address] [phone]        [facility] Go on 02/26/2018.   Why:  Your hospital follow up appointment is Thursday, 02/26/18 at 9:15a.  Please bring: photo ID, proof of insurance and income, social security card, and any discharge paperwork from this hospitalization.  Contact information: [address] [place] [address] [phone]          Follow-up recommendations: Activity:  As tolerated Diet: As  recommended by your primary care doctor. Keep all scheduled follow-up appointments as recommended.   Comments: Patient is instructed prior to discharge to: Take all medications as prescribed by his/her mental healthcare provider. Report any adverse effects and or reactions from the medicines to his/her outpatient provider promptly. Patient has been instructed & cautioned: To not engage in alcohol and or illegal drug  use while on prescription medicines. In the event of worsening symptoms, patient is instructed to call the crisis hotline, 911 and or go to the nearest ED for appropriate evaluation and treatment of symptoms. To follow-up with his/her primary care provider for your other medical issues, concerns and or health care needs.   Signed: Armandina StammerAgnes Tali Cleaves, NP, pmhnp, fnp-BC 02/18/2018, 9:56 AM

## 2018-02-18 NOTE — Progress Notes (Signed)
Recreation Therapy Notes  Date: 1.8.20 Time: 0930 Location: [address] Dayroom  Group Topic: Stress Management  Goal Area(s) Addresses:  Patient will identify benefits of stress management. Patient will identify stress management techniques. Patient will identify benefits of using stress management post d/c.   Intervention: Stress Management  Activity :  Guided Imagery.  LRT introduced the stress management technique of guided imagery.  LRT read a script that took patients on a journey through a wildlife sanctuary.  Patients were to follow along as script was read to engage in activity.  Education:  Stress Management, Discharge Planning.   Education Outcome: Acknowledges Education  Clinical Observations/Feedback:  Pt did not attend group.    Shaunessy Dobratz, LRT/CTRS         Catarina Huntley A 02/18/2018 10:42 AM 

## 2018-02-18 NOTE — Tx Team (Signed)
Interdisciplinary Treatment and Diagnostic Plan Update  02/18/2018 Time of Session: 9:00am Julie FriesChristina M Sanford MRN: [id number]  Principal Diagnosis: Heroin dependence (HCC)  Secondary Diagnoses: Principal Problem:   Heroin dependence (HCC) Active Problems:   MDD (major depressive disorder), recurrent severe, without psychosis (HCC)   Current Medications:  Current Facility-Administered Medications  Medication Dose Route Frequency Provider Last Rate Last Dose  . acetaminophen (TYLENOL) tablet 650 mg  650 mg Oral Q4H PRN Maryagnes AmosStarkes-Perry, Takia S, FNP   650 mg at 02/18/18 [id number]  . albuterol (PROVENTIL HFA;VENTOLIN HFA) 108 (90 Base) MCG/ACT inhaler 2 puff  2 puff Inhalation Q4H PRN Rosario AdieStarkes-Perry, Juel Burrowakia S, FNP      . alum & mag hydroxide-simeth (MAALOX/MYLANTA) 200-200-20 MG/5ML suspension 30 mL  30 mL Oral Q6H PRN Starkes-Perry, Juel Burrowakia S, FNP      . gabapentin (NEURONTIN) capsule 300 mg  300 mg Oral TID Malvin JohnsFarah, Brian, MD   300 mg at 02/18/18 0813  . hydrOXYzine (ATARAX/VISTARIL) tablet 100 mg  100 mg Oral TID PRN Maryagnes AmosStarkes-Perry, Takia S, FNP   100 mg at 02/18/18 0813  . levETIRAcetam (KEPPRA) tablet 1,000 mg  1,000 mg Oral BID Maryagnes AmosStarkes-Perry, Takia S, FNP   1,000 mg at 02/18/18 0813  . magnesium hydroxide (MILK OF MAGNESIA) suspension 30 mL  30 mL Oral Daily PRN Maryagnes AmosStarkes-Perry, Takia S, FNP      . methocarbamol (ROBAXIN) tablet 1,000 mg  1,000 mg Oral TID Malvin JohnsFarah, Brian, MD   1,000 mg at 02/17/18 [id number]  . mirtazapine (REMERON SOL-TAB) disintegrating tablet 45 mg  45 mg Oral QHS Malvin JohnsFarah, Brian, MD      . naproxen (NAPROSYN) tablet 500 mg  500 mg Oral BID PRN Kerry HoughSimon, Spencer E, PA-C   500 mg at 02/18/18 0813  . neomycin-bacitracin-polymyxin (NEOSPORIN) ointment   Topical TID Antonieta Pertlary, Greg Lawson, MD   1 application at 02/17/18 1711  . nicotine (NICODERM CQ - dosed in mg/24 hours) patch 21 mg  21 mg Transdermal Daily Cobos, Rockey SituFernando A, MD   21 mg at 02/18/18 0813  . ondansetron (ZOFRAN) tablet 8 mg  8 mg Oral Q8H  PRN Antonieta Pertlary, Greg Lawson, MD   8 mg at 02/18/18 [id number]  . sertraline (ZOLOFT) tablet 50 mg  50 mg Oral Daily Oneta RackLewis, Tanika N, NP   50 mg at 02/18/18 0813   PTA Medications: Medications Prior to Admission  Medication Sig Dispense Refill Last Dose  . DM-Doxylamine-Acetaminophen (NYQUIL HBP COLD & FLU) 15-6.25-325 MG/15ML LIQD Take 15 mLs by mouth every 4 (four) hours as needed (cold , flu symptoms).   02/11/2018 at Unknown time  . ibuprofen (ADVIL,MOTRIN) 200 MG tablet Take 800 mg by mouth every 6 (six) hours as needed for headache.   Past Week at Unknown time  . levETIRAcetam (KEPPRA) 500 MG tablet Take 500 mg by mouth 2 (two) times daily.   6 months ago  . Melatonin 10 MG TBCR Take 1 tablet by mouth at bedtime.   02/10/2018  . nicotine (NICODERM CQ - DOSED IN MG/24 HOURS) 21 mg/24hr patch Place 1 patch (21 mg total) onto the skin daily. (Patient not taking: Reported on 01/21/2018) 28 patch 0 Not Taking at Unknown time    Patient Stressors: Financial difficulties Marital or family conflict Substance abuse Traumatic event  Patient Strengths: Ability for insight Capable of independent living General fund of knowledge  Treatment Modalities: Medication Management, Group therapy, Case management,  1 to 1 session with clinician, Psychoeducation, Recreational therapy.   Physician Treatment Plan for  Primary Diagnosis: Heroin dependence (HCC) Long Term Goal(s): Improvement in symptoms so as ready for discharge Improvement in symptoms so as ready for discharge   Short Term Goals: Ability to identify changes in lifestyle to reduce recurrence of condition will improve Ability to verbalize feelings will improve Ability to disclose and discuss suicidal ideas Ability to demonstrate self-control will improve Ability to identify and develop effective coping behaviors will improve Compliance with prescribed medications will improve  Medication Management: Evaluate patient's response, side effects, and  tolerance of medication regimen.  Therapeutic Interventions: 1 to 1 sessions, Unit Group sessions and Medication administration.  Evaluation of Outcomes: Adequate for discharge   Physician Treatment Plan for Secondary Diagnosis: Principal Problem:   Heroin dependence (HCC) Active Problems:   MDD (major depressive disorder), recurrent severe, without psychosis (HCC)  Long Term Goal(s): Improvement in symptoms so as ready for discharge Improvement in symptoms so as ready for discharge   Short Term Goals: Ability to identify changes in lifestyle to reduce recurrence of condition will improve Ability to verbalize feelings will improve Ability to disclose and discuss suicidal ideas Ability to demonstrate self-control will improve Ability to identify and develop effective coping behaviors will improve Compliance with prescribed medications will improve     Medication Management: Evaluate patient's response, side effects, and tolerance of medication regimen.  Therapeutic Interventions: 1 to 1 sessions, Unit Group sessions and Medication administration.  Evaluation of Outcomes: Adequate for discharge   RN Treatment Plan for Primary Diagnosis: Heroin dependence (HCC) Long Term Goal(s): Knowledge of disease and therapeutic regimen to maintain health will improve  Short Term Goals: Ability to remain free from injury will improve, Ability to verbalize frustration and anger appropriately will improve, Ability to verbalize feelings will improve, Ability to disclose and discuss suicidal ideas, Ability to identify and develop effective coping behaviors will improve and Compliance with prescribed medications will improve  Medication Management: RN will administer medications as ordered by provider, will assess and evaluate patient's response and provide education to patient for prescribed medication. RN will report any adverse and/or side effects to prescribing provider.  Therapeutic Interventions:  1 on 1 counseling sessions, Psychoeducation, Medication administration, Evaluate responses to treatment, Monitor vital signs and CBGs as ordered, Perform/monitor CIWA, COWS, AIMS and Fall Risk screenings as ordered, Perform wound care treatments as ordered.  Evaluation of Outcomes: Adequate for discharge   LCSW Treatment Plan for Primary Diagnosis: Heroin dependence (HCC) Long Term Goal(s): Safe transition to appropriate next level of care at discharge, Engage patient in therapeutic group addressing interpersonal concerns.  Short Term Goals: Engage patient in aftercare planning with referrals and resources, Increase social support, Increase emotional regulation, Facilitate patient progression through stages of change regarding substance use diagnoses and concerns, Identify triggers associated with mental health/substance abuse issues and Increase skills for wellness and recovery  Therapeutic Interventions: Assess for all discharge needs, 1 to 1 time with Social worker, Explore available resources and support systems, Assess for adequacy in community support network, Educate family and significant other(s) on suicide prevention, Complete Psychosocial Assessment, Interpersonal group therapy.  Evaluation of Outcomes: Adequate for discharge   Progress in Treatment: Attending groups: No. New to unit. Participating in groups: No. Taking medication as prescribed: Yes. Toleration medication: Yes. Family/Significant other contact made: No, will contact:  supports if consent is given Patient understands diagnosis: Yes. Discussing patient identified problems/goals with staff: Yes. Medical problems stabilized or resolved: No. Denies suicidal/homicidal ideation: Yes. Issues/concerns per patient self-inventory: Yes.  New problem(s) identified:  Yes, Describe:  reports being kicked out by family; may need shelter resources  New Short Term/Long Term Goal(s): detox, medication management for mood  stabilization; elimination of SI thoughts; development of comprehensive mental wellness/sobriety plan.  Patient Goals: "Work on my sobriety. Be better with family."  Discharge Plan or Barriers: [facility] referral made. No beds available currently. [facility] appt made. Pt plans to stay with a family member until she is able to get into residential treatment.  [facility] pamphlet, Mobile Crisis information, and AA/NA information provided to patient for additional community support and resources.   Reason for Continuation of Hospitalization: none  Estimated Length of Stay: Today, 02/18/2018  Attendees: Patient: Julie Sanford 02/18/2018 9:14 AM  Physician: Dr. Jeannine Kitten MD 02/18/2018 9:14 AM  Nursing: Ivonne Andrew RN; Alyssa RN 02/18/2018 9:14 AM  RN Care Manager:x 02/18/2018 9:14 AM  Social Worker: Corrie Mckusick LCSW 02/18/2018 9:14 AM  Recreational Therapist: x 02/18/2018 9:14 AM  Other: Armandina Stammer NP; Marciano Sequin NP 02/18/2018 9:14 AM  Other:  02/18/2018 9:14 AM  Other: 02/18/2018 9:14 AM    Scribe for Treatment Team: Rona Ravens, LCSW 02/18/2018 9:14 AM

## 2018-02-18 NOTE — Progress Notes (Signed)
  [facility] Adult Case Management Discharge Plan :  Will you be returning to the same living situation after discharge:  Yes,  to stay with a friend At discharge, do you have transportation home?: Yes,  friend Do you have the ability to pay for your medications: Yes,  medicaid  Release of information consent forms completed and submitted to medical records by CSW.   Patient to Follow up at: Follow-up Information    [facility] Follow up.   Specialty:  Addiction Medicine Why:  Referral made 02/13/2018.  Contact information: [address] [place] [address] [phone]        Inc, [address] [facility]. Go on 02/26/2018.   Why:  Your hospital follow up appointment is Thursday, 02/26/18 at 9:15a.  Please bring: photo ID, proof of insurance and income, social security card, and any discharge paperwork from this hospitalization.  Contact information: [address] [place] [address] [phone]          Next level of care provider has access to [facility] Link:no  Safety Planning and Suicide Prevention discussed: Yes,  SPE completed with pt; pt declined to consent to collateral contact. [facility] pamphlet and mobile crisis information provided.   Have you used any form of tobacco in the last 30 days? (Cigarettes, Smokeless Tobacco, Cigars, and/or Pipes): Yes  Has patient been referred to the Quitline?: Patient refused referral  Patient has been referred for addiction treatment: Yes  Rona Ravens, LCSW 02/18/2018, 11:50 AM

## 2018-02-18 NOTE — Plan of Care (Signed)
  Problem: Education: Goal: Knowledge of [facility] Education information/materials will improve Outcome: Adequate for Discharge   Problem: Education: Goal: Emotional status will improve Outcome: Adequate for Discharge   Problem: Education: Goal: Mental status will improve Outcome: Adequate for Discharge   Problem: Education: Goal: Verbalization of understanding the information provided will improve Outcome: Adequate for Discharge   

## 2018-02-18 NOTE — [facility] Suicide Risk Assessment (Signed)
[facility] Discharge Suicide Risk Assessment   Principal Problem: Heroin dependence (HCC) Discharge Diagnoses: Principal Problem:   Heroin dependence (HCC) Active Problems:   MDD (major depressive disorder), recurrent severe, without psychosis (HCC) Current mental status exam-alert oriented to person place time situation affect still constricted mood dysphoric but no thoughts of harming self contracting, no psychosis no cravings tremors or withdrawal other than protracted anxiety in the context of withdrawal  Total Time spent with patient: 30 minutes Mental Status Per Nursing Assessment::   On Admission:  Suicidal ideation indicated by patient, Suicide plan, Self-harm thoughts, Self-harm behaviors  Demographic Factors:  Caucasian  Loss Factors: Decrease in vocational status and Decline in physical health  Historical Factors: NA  Risk Reduction Factors:   Religious beliefs about death  Continued Clinical Symptoms:  Depression:   Severe  Cognitive Features That Contribute To Risk:  None    Suicide Risk:  Minimal: No identifiable suicidal ideation.  Patients presenting with no risk factors but with morbid ruminations; may be classified as minimal risk based on the severity of the depressive symptoms  Follow-up Information    [facility] Follow up.   Specialty:  Addiction Medicine Why:  Referral made 02/13/2018.  Contact information: [address] [place] [address] [phone]        Inc, [address] [facility]. Go on 02/26/2018.   Why:  Your hospital follow up appointment is Thursday, 02/26/18 at 9:15a.  Please bring: photo ID, proof of insurance and income, social security card, and any discharge paperwork from this hospitalization.  Contact information: [address] [place] [address] [phone]           Plan Of Care/Follow-up recommendations:  Activity:  full  Keerthi Hazell, MD 02/18/2018, 7:52 AM

## 2019-01-21 ENCOUNTER — Encounter (HOSPITAL_COMMUNITY): Payer: Self-pay | Admitting: Internal Medicine

## 2019-01-21 ENCOUNTER — Inpatient Hospital Stay (HOSPITAL_COMMUNITY)
Admission: AD | Admit: 2019-01-21 | Discharge: 2019-01-24 | DRG: 682 | Discharge status: Against Medical Advice | Payer: Medicaid Other | Source: Other Acute Inpatient Hospital | Attending: Internal Medicine | Admitting: Internal Medicine

## 2019-01-21 DIAGNOSIS — F319 Bipolar disorder, unspecified: Secondary | ICD-10-CM | POA: Diagnosis present

## 2019-01-21 DIAGNOSIS — M6282 Rhabdomyolysis: Secondary | ICD-10-CM | POA: Diagnosis present

## 2019-01-21 DIAGNOSIS — F1120 Opioid dependence, uncomplicated: Secondary | ICD-10-CM | POA: Diagnosis not present

## 2019-01-21 DIAGNOSIS — M609 Myositis, unspecified: Secondary | ICD-10-CM | POA: Diagnosis present

## 2019-01-21 DIAGNOSIS — E039 Hypothyroidism, unspecified: Secondary | ICD-10-CM | POA: Diagnosis present

## 2019-01-21 DIAGNOSIS — I340 Nonrheumatic mitral (valve) insufficiency: Secondary | ICD-10-CM | POA: Diagnosis not present

## 2019-01-21 DIAGNOSIS — D509 Iron deficiency anemia, unspecified: Secondary | ICD-10-CM | POA: Diagnosis present

## 2019-01-21 DIAGNOSIS — G40909 Epilepsy, unspecified, not intractable, without status epilepticus: Secondary | ICD-10-CM | POA: Diagnosis present

## 2019-01-21 DIAGNOSIS — R0602 Shortness of breath: Secondary | ICD-10-CM

## 2019-01-21 DIAGNOSIS — J189 Pneumonia, unspecified organism: Secondary | ICD-10-CM | POA: Diagnosis present

## 2019-01-21 DIAGNOSIS — R079 Chest pain, unspecified: Secondary | ICD-10-CM

## 2019-01-21 DIAGNOSIS — Z20828 Contact with and (suspected) exposure to other viral communicable diseases: Secondary | ICD-10-CM | POA: Diagnosis present

## 2019-01-21 DIAGNOSIS — B179 Acute viral hepatitis, unspecified: Secondary | ICD-10-CM

## 2019-01-21 DIAGNOSIS — N179 Acute kidney failure, unspecified: Principal | ICD-10-CM | POA: Diagnosis present

## 2019-01-21 DIAGNOSIS — E860 Dehydration: Secondary | ICD-10-CM | POA: Diagnosis present

## 2019-01-21 DIAGNOSIS — D61818 Other pancytopenia: Secondary | ICD-10-CM | POA: Diagnosis present

## 2019-01-21 DIAGNOSIS — F17210 Nicotine dependence, cigarettes, uncomplicated: Secondary | ICD-10-CM | POA: Diagnosis present

## 2019-01-21 DIAGNOSIS — I361 Nonrheumatic tricuspid (valve) insufficiency: Secondary | ICD-10-CM | POA: Diagnosis not present

## 2019-01-21 DIAGNOSIS — Z833 Family history of diabetes mellitus: Secondary | ICD-10-CM

## 2019-01-21 DIAGNOSIS — Z5329 Procedure and treatment not carried out because of patient's decision for other reasons: Secondary | ICD-10-CM | POA: Diagnosis not present

## 2019-01-21 DIAGNOSIS — R778 Other specified abnormalities of plasma proteins: Secondary | ICD-10-CM | POA: Diagnosis not present

## 2019-01-21 DIAGNOSIS — B1710 Acute hepatitis C without hepatic coma: Secondary | ICD-10-CM | POA: Diagnosis present

## 2019-01-21 DIAGNOSIS — R0789 Other chest pain: Secondary | ICD-10-CM | POA: Diagnosis not present

## 2019-01-21 DIAGNOSIS — N170 Acute kidney failure with tubular necrosis: Secondary | ICD-10-CM | POA: Diagnosis not present

## 2019-01-21 NOTE — H&P (Signed)
History and Physical    Julie FriesChristina M Vanderkolk UJW:[id number] DOB: March 29, 1980 DOA: 01/21/2019  PCP: Patient, No Pcp Per  Patient coming from: Patient was transferred from [facility].  Chief Complaint: Altered mental status.  HPI: Julie FriesChristina M Sanford is a 38 y.o. female with history of IV drug abuse, seizures, bipolar disorder was brought to the ER at [facility] about 24 hours ago after patient was found to be in altered mental status and EMS was called by patient's mother.  At the ER patient responded well to Narcan but was hypoxic.  Prior to this patient had come to the ER 2 days ago with low back pain at the time I was showing myositis of the lumbar area was placed on Keflex.  Patient states she has been feeling weak since then of the lower extremities.  Her legs were giving away.  Denies any chest pain shortness of breath has been having some headaches.  States the last time she took IV drugs was almost a month ago.  Labs done at the ER showed markedly elevated LFTs with AST around 2600 and ALT 1200 INR was normal creatinine was 3.1 which increased from baseline of normal albumin is normal chest x-ray showed infiltrates concerning for pneumonia patient was on 4 L oxygen COVID-19 was negative.  proBNP was 1400.  Given acute renal failure with markedly elevated LFTs plan was to [facility].  Dr. Marca AnconaKarki gastroenterologist was consulted and agreed to see patient when [facility].  On my exam patient appears nonfocal complains of low back pain and headache.  Patient also has been having some chest pressure patient states over the last few days.  Denies any shortness of breath.  ED Course: Patient was a direct admit.  Review of Systems: As per HPI, rest all negative.   Past Medical History:  Diagnosis Date  . Bipolar 1 disorder (HCC)   . Depression   . Herpes   . Seizures (HCC)     Past Surgical History:  Procedure Laterality Date  . CESAREAN  SECTION       reports that she has been smoking cigarettes. She has been smoking about 2.00 packs per day. She has never used smokeless tobacco. She reports current drug use. Drug: Heroin. She reports that she does not drink alcohol.  Allergies  Allergen Reactions  . Quetiapine Other (See Comments)    Severe muscle spasms and hallucinations  . Tramadol Other (See Comments)    Muscle spasms, seizure and hallucinations  . Penicillins Hives    Can't remember, told as a child Has patient had a PCN reaction causing immediate rash, facial/tongue/throat swelling, SOB or lightheadedness with hypotension: no Has patient had a PCN reaction causing severe rash involving mucus membranes or skin necrosis: no Has patient had a PCN reaction that required hospitalization: no Has patient had a PCN reaction occurring within the last 10 years: no If all of the above answers are "NO", then may proceed with Cephalosporin use.     Family History  Problem Relation Age of Onset  . Diabetes Sister     Prior to Admission medications   Medication Sig Start Date End Date Taking? Authorizing Provider  albuterol (PROVENTIL HFA;VENTOLIN HFA) 108 (90 Base) MCG/ACT inhaler Inhale 2 puffs into the lungs every 4 (four) hours as needed for wheezing or shortness of breath. 02/18/18   Armandina StammerNwoko, Agnes I, NP  gabapentin (NEURONTIN) 300 MG capsule Take 1 capsule (300 mg total)  by mouth 3 (three) times daily. Agitation 02/18/18   Lindell Spar I, NP  hydrOXYzine (ATARAX/VISTARIL) 50 MG tablet Take 2 tablets (100 mg total) by mouth 3 (three) times daily as needed for itching or anxiety. 02/18/18   Lindell Spar I, NP  levETIRAcetam (KEPPRA) 500 MG tablet Take 1 tablet (500 mg total) by mouth 2 (two) times daily. For seizures 02/18/18   Lindell Spar I, NP  mirtazapine (REMERON SOL-TAB) 45 MG disintegrating tablet Take 1 tablet (45 mg total) by mouth at bedtime. For depression 02/18/18   Lindell Spar I, NP  nicotine (NICODERM CQ - DOSED IN  MG/24 HOURS) 21 mg/24hr patch Place 1 patch (21 mg total) onto the skin daily. (May buy from over the counter): For smoking cessation 02/18/18   Lindell Spar I, NP  sertraline (ZOLOFT) 50 MG tablet Take 1 tablet (50 mg total) by mouth daily. For depression 02/19/18   Encarnacion Slates, NP    Physical Exam: Constitutional: Moderately built and nourished. Vitals:   01/21/19 2213  BP: (!) 160/105  Pulse: 87  Resp: 16  Temp: (!) 97.4 F (36.3 C)  TempSrc: Oral  SpO2: 92%  Weight: 65 kg  Height: 4\' 11"  (1.499 m)   Eyes: Anicteric no pallor. ENMT: No discharge from the ears eyes nose or mouth. Neck: No mass or.  No neck rigidity. Respiratory: No rhonchi or crepitations. Cardiovascular: S1-S2 heard. Abdomen: Soft nontender bowel sound present. Musculoskeletal: No edema. Skin: No rash. Neurologic: Alert awake oriented time place and person.  Moves all extremities. Psychiatric: Appears normal per normal affect.   Labs on Admission: I have personally reviewed following labs and imaging studies  CBC: No results for input(s): WBC, NEUTROABS, HGB, HCT, MCV, PLT in the last 168 hours. Basic Metabolic Panel: No results for input(s): NA, K, CL, CO2, GLUCOSE, BUN, CREATININE, CALCIUM, MG, PHOS in the last 168 hours. GFR: CrCl cannot be calculated (Patient's most recent lab result is older than the maximum 21 days allowed.). Liver Function Tests: No results for input(s): AST, ALT, ALKPHOS, BILITOT, PROT, ALBUMIN in the last 168 hours. No results for input(s): LIPASE, AMYLASE in the last 168 hours. No results for input(s): AMMONIA in the last 168 hours. Coagulation Profile: No results for input(s): INR, PROTIME in the last 168 hours. Cardiac Enzymes: No results for input(s): CKTOTAL, CKMB, CKMBINDEX, TROPONINI in the last 168 hours. BNP (last 3 results) No results for input(s): PROBNP in the last 8760 hours. HbA1C: No results for input(s): HGBA1C in the last 72 hours. CBG: No results for  input(s): GLUCAP in the last 168 hours. Lipid Profile: No results for input(s): CHOL, HDL, LDLCALC, TRIG, CHOLHDL, LDLDIRECT in the last 72 hours. Thyroid Function Tests: No results for input(s): TSH, T4TOTAL, FREET4, T3FREE, THYROIDAB in the last 72 hours. Anemia Panel: No results for input(s): VITAMINB12, FOLATE, FERRITIN, TIBC, IRON, RETICCTPCT in the last 72 hours. Urine analysis:    Component Value Date/Time   COLORURINE YELLOW 07/10/2017 2213   APPEARANCEUR TURBID (A) 07/10/2017 2213   LABSPEC 1.030 07/10/2017 2213   PHURINE 5.0 07/10/2017 2213   GLUCOSEU NEGATIVE 07/10/2017 2213   HGBUR NEGATIVE 07/10/2017 2213   BILIRUBINUR NEGATIVE 07/10/2017 2213   KETONESUR 5 (A) 07/10/2017 2213   PROTEINUR 30 (A) 07/10/2017 2213   UROBILINOGEN 0.2 10/24/2013 0120   NITRITE NEGATIVE 07/10/2017 2213   LEUKOCYTESUR NEGATIVE 07/10/2017 2213   Sepsis Labs: @LABRCNTIP (procalcitonin:4,lacticidven:4) )No results found for this or any previous visit (from the past 240 hour(s)).  Radiological Exams on Admission: No results found.  EKG: Independently reviewed.  Normal sinus rhythm with nonspecific changes.  Assessment/Plan Principal Problem:   Acute renal failure (ARF) (HCC) Active Problems:   Heroin dependence (HCC)   Acute hepatitis   ARF (acute renal failure) (HCC)    1. Acute renal failure cause not clear.  I have ordered repeat metabolic panel and FENa.  We will also order a CT abdomen pelvis since patient has marked elevated LFTs and complaining of low back pain.  Will check for any obstruction. 2. Acute hepatitis  -denies taking Tylenol now denies any suicidal ideation.  Follow LFTs check Tylenol level acute hepatitis panel and follow CT abdomen pelvis.  Abdomen appears benign. 3. Lower extremity weakness with recent MRI showing myositis.  Patient states he still has weakness of the lower extremity unable to walk but on my exam patient able to move all extremities but patient  states she feels weak on walking.  We will try to get the lumbar spine reports.  Also get a T-spine MRI. 4. Possible pneumonia on empiric antibiotics follow cultures. 5. Pancytopenia cause not clear.  Check LDH smear review may need hematology input if continues to worsen. 6. IV drug abuse follow blood cultures and presently on empiric antibiotics.  Discussed with pharmacy to place patient on buprenorphine. 7. Elevated troponin with some chest pressure.  Will check 2D echo cardiology consult aspirin.  Note that patient has thrombocytopenia.  EKG is unremarkable.  If D-dimer is elevated will need VQ scan. 8. History of seizures on Keppra. 9. History of depression medication has to be verified.  Repeat Covid test is pending.  Given the multiple acute changes including renal failure hepatitis elevated troponin patient will need inpatient status.   DVT prophylaxis: SCDs.  Note that patient has thrombocytopenia. Code Status: Full code. Family Communication: Discussed with patient. Disposition Plan: Home. Consults called: Cardiology. Admission status: Inpatient.   Eduard Clos MD [facility] Pager [phone].  If 7PM-7AM, please contact night-coverage [email or web address] Password [email or web address]  01/21/2019, 10:59 PM

## 2019-01-22 ENCOUNTER — Inpatient Hospital Stay (HOSPITAL_COMMUNITY): Payer: Medicaid Other

## 2019-01-22 DIAGNOSIS — I340 Nonrheumatic mitral (valve) insufficiency: Secondary | ICD-10-CM

## 2019-01-22 DIAGNOSIS — R778 Other specified abnormalities of plasma proteins: Secondary | ICD-10-CM

## 2019-01-22 DIAGNOSIS — I361 Nonrheumatic tricuspid (valve) insufficiency: Secondary | ICD-10-CM

## 2019-01-22 DIAGNOSIS — R079 Chest pain, unspecified: Secondary | ICD-10-CM

## 2019-01-22 DIAGNOSIS — R0602 Shortness of breath: Secondary | ICD-10-CM

## 2019-01-22 DIAGNOSIS — N170 Acute kidney failure with tubular necrosis: Secondary | ICD-10-CM

## 2019-01-22 LAB — CBC WITH DIFFERENTIAL/PLATELET
Abs Immature Granulocytes: 0.16 10*3/uL — ABNORMAL HIGH (ref 0.00–0.07)
Basophils Absolute: 0 10*3/uL (ref 0.0–0.1)
Basophils Relative: 1 %
Eosinophils Absolute: 0 10*3/uL (ref 0.0–0.5)
Eosinophils Relative: 2 %
HCT: 26.2 % — ABNORMAL LOW (ref 36.0–46.0)
Hemoglobin: 7.9 g/dL — ABNORMAL LOW (ref 12.0–15.0)
Immature Granulocytes: 6 %
Lymphocytes Relative: 18 %
Lymphs Abs: 0.5 10*3/uL — ABNORMAL LOW (ref 0.7–4.0)
MCH: 23.1 pg — ABNORMAL LOW (ref 26.0–34.0)
MCHC: 30.2 g/dL (ref 30.0–36.0)
MCV: 76.6 fL — ABNORMAL LOW (ref 80.0–100.0)
Monocytes Absolute: 0.2 10*3/uL (ref 0.1–1.0)
Monocytes Relative: 6 %
Neutro Abs: 1.9 10*3/uL (ref 1.7–7.7)
Neutrophils Relative %: 67 %
Platelets: 62 10*3/uL — ABNORMAL LOW (ref 150–400)
RBC: 3.42 MIL/uL — ABNORMAL LOW (ref 3.87–5.11)
RDW: 18 % — ABNORMAL HIGH (ref 11.5–15.5)
WBC: 2.8 10*3/uL — ABNORMAL LOW (ref 4.0–10.5)
nRBC: 0.7 % — ABNORMAL HIGH (ref 0.0–0.2)

## 2019-01-22 LAB — FERRITIN: Ferritin: 21 ng/mL (ref 11–307)

## 2019-01-22 LAB — HEPATIC FUNCTION PANEL
ALT: 1036 U/L — ABNORMAL HIGH (ref 0–44)
AST: 1354 U/L — ABNORMAL HIGH (ref 15–41)
Albumin: 3.2 g/dL — ABNORMAL LOW (ref 3.5–5.0)
Alkaline Phosphatase: 65 U/L (ref 38–126)
Bilirubin, Direct: 0.1 mg/dL (ref 0.0–0.2)
Indirect Bilirubin: 0.7 mg/dL (ref 0.3–0.9)
Total Bilirubin: 0.8 mg/dL (ref 0.3–1.2)
Total Protein: 6 g/dL — ABNORMAL LOW (ref 6.5–8.1)

## 2019-01-22 LAB — URINALYSIS, ROUTINE W REFLEX MICROSCOPIC
Bilirubin Urine: NEGATIVE
Glucose, UA: NEGATIVE mg/dL
Ketones, ur: 20 mg/dL — AB
Leukocytes,Ua: NEGATIVE
Nitrite: NEGATIVE
Protein, ur: NEGATIVE mg/dL
Specific Gravity, Urine: 1.013 (ref 1.005–1.030)
pH: 6 (ref 5.0–8.0)

## 2019-01-22 LAB — TROPONIN I (HIGH SENSITIVITY)
Troponin I (High Sensitivity): 314 ng/L (ref <18)
Troponin I (High Sensitivity): 319 ng/L (ref <18)

## 2019-01-22 LAB — HEPATITIS PANEL, ACUTE
HCV Ab: REACTIVE — AB
Hep A IgM: NONREACTIVE
Hep B C IgM: NONREACTIVE
Hepatitis B Surface Ag: NONREACTIVE

## 2019-01-22 LAB — BASIC METABOLIC PANEL
Anion gap: 11 (ref 5–15)
Anion gap: 6 (ref 5–15)
BUN: 25 mg/dL — ABNORMAL HIGH (ref 6–20)
BUN: 18 mg/dL (ref 6–20)
CO2: 22 mmol/L (ref 22–32)
CO2: 27 mmol/L (ref 22–32)
Calcium: 7.6 mg/dL — ABNORMAL LOW (ref 8.9–10.3)
Calcium: 7.6 mg/dL — ABNORMAL LOW (ref 8.9–10.3)
Chloride: 105 mmol/L (ref 98–111)
Chloride: 104 mmol/L (ref 98–111)
Creatinine, Ser: 1.83 mg/dL — ABNORMAL HIGH (ref 0.44–1.00)
Creatinine, Ser: 1.29 mg/dL — ABNORMAL HIGH (ref 0.44–1.00)
GFR calc Af Amer: 40 mL/min — ABNORMAL LOW (ref >60)
GFR calc Af Amer: >60 mL/min (ref >60)
GFR calc non Af Amer: 35 mL/min — ABNORMAL LOW (ref >60)
GFR calc non Af Amer: 53 mL/min — ABNORMAL LOW (ref >60)
Glucose, Bld: 85 mg/dL (ref 70–99)
Glucose, Bld: 100 mg/dL — ABNORMAL HIGH (ref 70–99)
Potassium: 4.2 mmol/L (ref 3.5–5.1)
Potassium: 3.4 mmol/L — ABNORMAL LOW (ref 3.5–5.1)
Sodium: 138 mmol/L (ref 135–145)
Sodium: 137 mmol/L (ref 135–145)

## 2019-01-22 LAB — TSH: TSH: 19.042 u[IU]/mL — ABNORMAL HIGH (ref 0.350–4.500)

## 2019-01-22 LAB — ECHOCARDIOGRAM COMPLETE
Height: 59 in
Weight: 2294.4 oz

## 2019-01-22 LAB — SARS CORONAVIRUS 2 (TAT 6-24 HRS): SARS Coronavirus 2: NEGATIVE

## 2019-01-22 LAB — VITAMIN B12: Vitamin B-12: 575 pg/mL (ref 180–914)

## 2019-01-22 LAB — RETICULOCYTES
Immature Retic Fract: 16.1 % — ABNORMAL HIGH (ref 2.3–15.9)
RBC.: 3.29 MIL/uL — ABNORMAL LOW (ref 3.87–5.11)
Retic Count, Absolute: 41.5 10*3/uL (ref 19.0–186.0)
Retic Ct Pct: 1.3 % (ref 0.4–3.1)

## 2019-01-22 LAB — IRON AND TIBC
Iron: 11 ug/dL — ABNORMAL LOW (ref 28–170)
Saturation Ratios: 3 % — ABNORMAL LOW (ref 10.4–31.8)
TIBC: 335 ug/dL (ref 250–450)
UIBC: 324 ug/dL

## 2019-01-22 LAB — HIV ANTIBODY (ROUTINE TESTING W REFLEX): HIV Screen 4th Generation wRfx: NONREACTIVE

## 2019-01-22 LAB — MAGNESIUM: Magnesium: 2.1 mg/dL (ref 1.7–2.4)

## 2019-01-22 LAB — SODIUM, URINE, RANDOM: Sodium, Ur: 16 mmol/L

## 2019-01-22 LAB — FOLATE: Folate: 23.9 ng/mL (ref >5.9)

## 2019-01-22 LAB — T4, FREE: Free T4: 0.51 ng/dL — ABNORMAL LOW (ref 0.61–1.12)

## 2019-01-22 LAB — LACTATE DEHYDROGENASE: LDH: 1430 U/L — ABNORMAL HIGH (ref 98–192)

## 2019-01-22 LAB — BRAIN NATRIURETIC PEPTIDE: B Natriuretic Peptide: 485.9 pg/mL — ABNORMAL HIGH (ref 0.0–100.0)

## 2019-01-22 LAB — PROTIME-INR
INR: 1 (ref 0.8–1.2)
Prothrombin Time: 13.5 seconds (ref 11.4–15.2)

## 2019-01-22 LAB — PATHOLOGIST SMEAR REVIEW

## 2019-01-22 LAB — PREGNANCY, URINE: Preg Test, Ur: NEGATIVE

## 2019-01-22 LAB — ACETAMINOPHEN LEVEL: Acetaminophen (Tylenol), Serum: <10 ug/mL — ABNORMAL LOW (ref 10–30)

## 2019-01-22 LAB — CK: Total CK: 3816 U/L — ABNORMAL HIGH (ref 38–234)

## 2019-01-22 LAB — CREATININE, URINE, RANDOM: Creatinine, Urine: 99.03 mg/dL

## 2019-01-22 MED ORDER — LORAZEPAM 1 MG PO TABS
1.0000 mg | ORAL_TABLET | Freq: Four times a day (QID) | ORAL | Status: DC | PRN
  Administered 2019-01-24: 1 mg via ORAL
  Filled 2019-01-22: qty 1

## 2019-01-22 MED ORDER — SODIUM CHLORIDE 0.9 % IV SOLN
2.0000 g | INTRAVENOUS | Status: DC
  Administered 2019-01-22: 08:00:00 2 g via INTRAVENOUS
  Filled 2019-01-22 (×2): qty 2

## 2019-01-22 MED ORDER — BUPRENORPHINE HCL 0.3 MG/ML IJ SOLN
0.3000 mg | Freq: Once | INTRAMUSCULAR | Status: AC
  Administered 2019-01-22: 05:00:00 0.3 mg via INTRAVENOUS
  Filled 2019-01-22: qty 1

## 2019-01-22 MED ORDER — OXYCODONE HCL 5 MG PO TABS
5.0000 mg | ORAL_TABLET | Freq: Four times a day (QID) | ORAL | Status: DC | PRN
  Administered 2019-01-22 – 2019-01-24 (×4): 5 mg via ORAL
  Filled 2019-01-22 (×4): qty 1

## 2019-01-22 MED ORDER — VANCOMYCIN HCL IN DEXTROSE 1-5 GM/200ML-% IV SOLN: 1000.0000 mg | Freq: Once | INTRAVENOUS | Status: DC

## 2019-01-22 MED ORDER — BUPRENORPHINE HCL 0.3 MG/ML IJ SOLN: 0.3000 mg | Freq: Three times a day (TID) | INTRAMUSCULAR | Status: DC | PRN

## 2019-01-22 MED ORDER — ASPIRIN EC 81 MG PO TBEC
81.0000 mg | DELAYED_RELEASE_TABLET | Freq: Every day | ORAL | Status: DC
  Administered 2019-01-22 – 2019-01-24 (×3): 81 mg via ORAL
  Filled 2019-01-22 (×3): qty 1

## 2019-01-22 MED ORDER — VANCOMYCIN HCL 10 G IV SOLR
1250.0000 mg | Freq: Once | INTRAVENOUS | Status: AC
  Administered 2019-01-22: 1250 mg via INTRAVENOUS
  Filled 2019-01-22 (×2): qty 1250

## 2019-01-22 MED ORDER — VANCOMYCIN HCL IN DEXTROSE 1-5 GM/200ML-% IV SOLN
1000.0000 mg | INTRAVENOUS | Status: DC
  Administered 2019-01-23 – 2019-01-24 (×2): 1000 mg via INTRAVENOUS
  Filled 2019-01-22 (×2): qty 200

## 2019-01-22 MED ORDER — SODIUM CHLORIDE 0.9 % IV SOLN
INTRAVENOUS | Status: DC
  Administered 2019-01-22: 13:00:00 via INTRAVENOUS

## 2019-01-22 MED ORDER — SODIUM CHLORIDE 0.9 % IV SOLN
2.0000 g | Freq: Two times a day (BID) | INTRAVENOUS | Status: DC
  Administered 2019-01-22 – 2019-01-24 (×4): 2 g via INTRAVENOUS
  Filled 2019-01-22 (×9): qty 2

## 2019-01-22 MED ORDER — LEVETIRACETAM 500 MG PO TABS
500.0000 mg | ORAL_TABLET | Freq: Two times a day (BID) | ORAL | Status: DC
  Administered 2019-01-22 – 2019-01-24 (×6): 500 mg via ORAL
  Filled 2019-01-22 (×6): qty 1

## 2019-01-22 NOTE — Care Management (Addendum)
1421 01-22-19 Patient presented for altered mental status. Case Manager was bale to speak with patient regarding primary care provider. Patient is without a primary care provider at this time. She lives in [place] and states she has Medicaid. Per patient she was not assigned to a provider via Medicaid. CM did discuss the [facility] with the patient. Patient gave verbal permission for Case Manager to call and establish a hospital follow up appointment. Case Manager did call [facility] and they close early on Fridays at noon. Case Manager did place number and address on After Visit Summary (AVS) and patient will need to call to establish appointment. Patient has Medicaid, therefore medications will cost no more than $3.00. Case Manager  did discuss with patient if she needed information for substance abuse and she declined that she needed information. No further needs from Case Manager at this time. Bethena Roys, RN,BSN Case Manager [phone]

## 2019-01-22 NOTE — Progress Notes (Addendum)
Pharmacy Antibiotic Note  Julie Sanford is a 38 y.o. female admitted on 01/21/2019 with pneumonia.  Pharmacy has been consulted for vancomycin and cefepime dosing.  Pt w/ AKI, baseline SCr ~0.9, currently 1.83 (of note this has improved in the last 24h w/ SCr 2.4 at outside hospital ~12h ago).  Plan: Rec'd azithro and ceftriaxone at outside hospital. Vancomycin 1250mg  IV x1 now, monitor SCr prior to further doses. Cefepime 2g IV Q24H.  Height: 4\' 11"  (149.9 cm) Weight: 143 lb 6.4 oz (65 kg) IBW/kg (Calculated) : 43.2  Temp (24hrs), Avg:97.4 F (36.3 C), Min:97.4 F (36.3 C), Max:97.4 F (36.3 C)  Recent Labs  Lab 01/21/19 2318  WBC 2.8*  CREATININE 1.83*    Estimated Creatinine Clearance: 34.8 mL/min (A) (by C-G formula based on SCr of 1.83 mg/dL (H)).    Allergies  Allergen Reactions  . Quetiapine Other (See Comments)    Severe muscle spasms and hallucinations  . Tramadol Other (See Comments)    Muscle spasms, seizure and hallucinations  . Penicillins Hives    Can't remember, told as a child Has patient had a PCN reaction causing immediate rash, facial/tongue/throat swelling, SOB or lightheadedness with hypotension: no Has patient had a PCN reaction causing severe rash involving mucus membranes or skin necrosis: no Has patient had a PCN reaction that required hospitalization: no Has patient had a PCN reaction occurring within the last 10 years: no If all of the above answers are "NO", then may proceed with Cephalosporin use.     Thank you for allowing pharmacy to be a part of this patient's care.  Wynona Neat, PharmD, BCPS  01/22/2019 3:26 AM

## 2019-01-22 NOTE — Consult Note (Signed)
Cardiology Consultation:   Patient ID: Julie Sanford MRN: [id number]; DOB: Nov 21, 1980  Admit date: 01/21/2019 Date of Consult: 01/22/2019  Primary Care Provider: Patient, No Pcp Per Primary Cardiologist: New to [facility]  Primary Electrophysiologist:  None   Patient Profile:   Julie Sanford is a 37 y.o. female with a history of IV drug abuse, seizures, bipolar disorder, and depression but no known cardiac history who is being seen today for the evaluation of elevated troponin at the request of Dr. Hal Hope.  History of Present Illness:   Julie Sanford is a 38 year old female with with a history of IV drug abuse, seizures, bipolar disorder, and depression but no known cardiac history.  Patient reportedly seen in the [facility] ED 3 days ago for low back pain (cannot personally see this records). MRI showed myositis and was started on Keflex. Since then, patient reports lower extremity weakness. She presented to the [facility] ED via EMS on 01/20/2019 for further evaluation of altered mental status. In the ED, patient responded well to Narcan but was also noted to be hypoxic. She was placed on 4L of nasal cannula. Chest x-ray showed infiltrates concerning for pneumonia. COVID negative. Labs in ED showed markedly elevated LFTs and creatinine of 3.1; therefore, she was transferred to [facility] for further evaluation.   Upon arrival to [facility], patient reported non-focal low back pain and a headache. She also reports some chest pressure the last few days but denied any shortness of breath. Initial labs here: WBC 2.8, Hgb 7.9, Plts 62. Na 138, K 4.2, Ca 7.6, Glucose 85, Cr 1.83, BUN 25. AST 1354, ALT 1036, Alk Phos 65, Total Bili 0.8. LDH 1,430. High-sensitivity troponin 314 >> 319. BNP 485.9. TSH 19.042. Patient started on empiric antibiotics for possible pneumonia. CT abdomen has been ordered but not done yet. Cardiology consulted for elevated troponin.   At the time of this evaluation, patient  resting comfortably. Mental status has improved. She reports intermittent substernal non-radiating chest pain over the last 10 days. She has difficulty describing this pain but thinks it may be worse with activity at times. However, she also thinks it may be due to her anxiety. She denies any associated symptoms including shortness of breath, diaphoresis, nausea, vomiting, palpitations, lightheadedness, or dizziness. No fevers, chills, cough, abdominal pain, or recent illnesses. She does reports that she has had some hematuria over the last 2 weeks. Denies any hematochezia or melena. She is currently chest pain free. Her only complaint at this time is low back pain and leg pain.  She does have a 16 year smoking history and reports smoking just under 1 pack per day. She denies any alcohol or drug use to me. However, urine drug screen at [facility] positive for amphetamines and opiates. She has no family history of cardiovascular disease.    Heart Pathway Score:     Past Medical History:  Diagnosis Date  . Bipolar 1 disorder ([place])   . Depression   . Herpes   . Seizures ([place])     Past Surgical History:  Procedure Laterality Date  . CESAREAN SECTION       Home Medications:  Prior to Admission medications   Medication Sig Start Date End Date Taking? Authorizing Provider  albuterol (PROVENTIL HFA;VENTOLIN HFA) 108 (90 Base) MCG/ACT inhaler Inhale 2 puffs into the lungs every 4 (four) hours as needed for wheezing or shortness of breath. 02/18/18   Lindell Spar I, NP  gabapentin (NEURONTIN) 300 MG capsule  Take 1 capsule (300 mg total) by mouth 3 (three) times daily. Agitation 02/18/18   Lindell Spar I, NP  hydrOXYzine (ATARAX/VISTARIL) 50 MG tablet Take 2 tablets (100 mg total) by mouth 3 (three) times daily as needed for itching or anxiety. 02/18/18   Lindell Spar I, NP  levETIRAcetam (KEPPRA) 500 MG tablet Take 1 tablet (500 mg total) by mouth 2 (two) times daily. For seizures 02/18/18   Lindell Spar I, NP    mirtazapine (REMERON SOL-TAB) 45 MG disintegrating tablet Take 1 tablet (45 mg total) by mouth at bedtime. For depression 02/18/18   Lindell Spar I, NP  nicotine (NICODERM CQ - DOSED IN MG/24 HOURS) 21 mg/24hr patch Place 1 patch (21 mg total) onto the skin daily. (May buy from over the counter): For smoking cessation 02/18/18   Lindell Spar I, NP  sertraline (ZOLOFT) 50 MG tablet Take 1 tablet (50 mg total) by mouth daily. For depression 02/19/18   Lindell Spar I, NP    Inpatient Medications: Scheduled Meds: . aspirin EC  81 mg Oral Daily  . levETIRAcetam  500 mg Oral BID   Continuous Infusions: . ceFEPime (MAXIPIME) IV 2 g (01/22/19 0745)   PRN Meds: buprenorphine  Allergies:    Allergies  Allergen Reactions  . Quetiapine Other (See Comments)    Severe muscle spasms and hallucinations  . Tramadol Other (See Comments)    Muscle spasms, seizure and hallucinations  . Penicillins Hives    Can't remember, told as a child Has patient had a PCN reaction causing immediate rash, facial/tongue/throat swelling, SOB or lightheadedness with hypotension: no Has patient had a PCN reaction causing severe rash involving mucus membranes or skin necrosis: no Has patient had a PCN reaction that required hospitalization: no Has patient had a PCN reaction occurring within the last 10 years: no If all of the above answers are "NO", then may proceed with Cephalosporin use.     Social History:   Social History   Socioeconomic History  . Marital status: Legally Separated    Spouse name: Not on file  . Number of children: Not on file  . Years of education: Not on file  . Highest education level: Not on file  Occupational History  . Not on file  Tobacco Use  . Smoking status: Current Every Day Smoker    Packs/day: 2.00    Types: Cigarettes  . Smokeless tobacco: Never Used  Substance and Sexual Activity  . Alcohol use: No  . Drug use: Yes    Types: Heroin    Comment: was clean for 6 mths, using  x 1 week daily  . Sexual activity: Never    Comment: heroin  Other Topics Concern  . Not on file  Social History Narrative  . Not on file   Social Determinants of Health   Financial Resource Strain:   . Difficulty of Paying Living Expenses: Not on file  Food Insecurity:   . Worried About Charity fundraiser in the Last Year: Not on file  . Ran Out of Food in the Last Year: Not on file  Transportation Needs:   . Lack of Transportation (Medical): Not on file  . Lack of Transportation (Non-Medical): Not on file  Physical Activity:   . Days of Exercise per Week: Not on file  . Minutes of Exercise per Session: Not on file  Stress:   . Feeling of Stress : Not on file  Social Connections:   . Frequency of Communication  with Friends and Family: Not on file  . Frequency of Social Gatherings with Friends and Family: Not on file  . Attends Religious Services: Not on file  . Active Member of Clubs or Organizations: Not on file  . Attends Archivist Meetings: Not on file  . Marital Status: Not on file  Intimate Partner Violence:   . Fear of Current or Ex-Partner: Not on file  . Emotionally Abused: Not on file  . Physically Abused: Not on file  . Sexually Abused: Not on file    Family History:     Family History  Problem Relation Age of Onset  . Diabetes Sister      ROS:  Please see the history of present illness.  Review of Systems  Constitutional: Negative for chills and fever.  HENT: Negative for congestion.   Respiratory: Negative for cough and shortness of breath.   Cardiovascular: Positive for chest pain. Negative for palpitations, orthopnea, leg swelling and PND.  Gastrointestinal: Negative for abdominal pain, blood in stool, melena, nausea and vomiting.  Genitourinary: Positive for hematuria.  Musculoskeletal: Positive for back pain.  Neurological: Positive for weakness. Negative for dizziness.  Endo/Heme/Allergies: Does not bruise/bleed easily.    Psychiatric/Behavioral: Positive for substance abuse. The patient is nervous/anxious.      Physical Exam/Data:   Vitals:   01/21/19 2213 01/22/19 0544  BP: (!) 160/105 (!) 135/92  Pulse: 87 91  Resp: 16   Temp: (!) 97.4 F (36.3 C) 98.3 F (36.8 C)  TempSrc: Oral Oral  SpO2: 92% 98%  Weight: 65 kg   Height: _0  (1.499 m)     Intake/Output Summary (Last 24 hours) at 01/22/2019 0757 Last data filed at 01/22/2019 0600 Gross per 24 hour  Intake 77.29 ml  Output --  Net 77.29 ml   Last 3 Weights 01/21/2019 01/21/2018 07/10/2017  Weight (lbs) 143 lb 6.4 oz 120 lb 100 lb  Weight (kg) 65.046 kg 54.432 kg 45.36 kg  Some encounter information is confidential and restricted. Go to Review Flowsheets activity to see all data.     Body mass index is 28.96 kg/m.  General: 38 y.o. female resting comfortably in no acute distress. HEENT: Normocephalic and atraumatic. Sclera clear. EOMs intact. Neck: Supple. No carotid bruits. No JVD. Heart: RRR. Distinct S1 and S2. No murmurs, gallops, or rubs. Radial and distal pedal pulses 2+ and equal bilaterally. Lungs: No increased work of breathing. Clear to ausculation bilaterally. No wheezes, rhonchi, or rales.  Abdomen: Soft, non-distended, and non-tender to palpation. Bowel sounds present in all 4 quadrants.  MSK: Normal strength and tone for age. Extremities: No clubbing, cyanosis, or edema.    Skin: Warm and dry. Neuro: Alert and oriented x3. No focal deficits. Psych: Normal affect. Responds appropriately.   EKG:  The EKG was personally reviewed and demonstrates: Normal sinus rhythm, rate 96 bpm, with Q waves in leads V1-V2 and T wave flattening in aVL.  Telemetry:  Telemetry was personally reviewed and demonstrates:  Sinus rhythm with rates mostly in the 90's but occasional as high as the 110's.   Relevant CV Studies: N/A  Laboratory Data:  High Sensitivity Troponin:   Recent Labs  Lab 01/21/19 2318 01/22/19 0116   TROPONINIHS 314* 319*     Chemistry Recent Labs  Lab 01/21/19 2318  NA 138  K 4.2  CL 105  CO2 22  GLUCOSE 85  BUN 25*  CREATININE 1.83*  CALCIUM 7.6*  GFRNONAA 35*  GFRAA 40*  ANIONGAP 11    Recent Labs  Lab 01/21/19 2318  PROT 6.0*  ALBUMIN 3.2*  AST 1,354*  ALT 1,036*  ALKPHOS 65  BILITOT 0.8   Hematology Recent Labs  Lab 01/21/19 2318 01/22/19 0637  WBC 2.8*  --   RBC 3.42* 3.29*  HGB 7.9*  --   HCT 26.2*  --   MCV 76.6*  --   MCH 23.1*  --   MCHC 30.2  --   RDW 18.0*  --   PLT 62*  --    BNP Recent Labs  Lab 01/21/19 2321  BNP 485.9*    DDimer No results for input(s): DDIMER in the last 168 hours.   Radiology/Studies:  CT HEAD WO CONTRAST  Result Date: 01/22/2019 CLINICAL DATA:  Acute headache with normal neuro exam EXAM: CT HEAD WITHOUT CONTRAST TECHNIQUE: Contiguous axial images were obtained from the base of the skull through the vertex without intravenous contrast. COMPARISON:  09/09/2018 FINDINGS: Brain: No evidence of infarction, hemorrhage, hydrocephalus, extra-axial collection or mass lesion/mass effect. Vascular: Negative Skull: Negative for fracture or focal lesion. Atlantooccipital non segmentation Sinuses/Orbits: Negative IMPRESSION: Normal head CT. Electronically Signed   By: Monte Fantasia M.D.   On: 01/22/2019 04:13   DG Chest Port 1 View  Result Date: 01/22/2019 CLINICAL DATA:  Shortness of breath. EXAM: PORTABLE CHEST 1 VIEW COMPARISON:  January 20, 2019 FINDINGS: There are streaky bibasilar airspace opacities which have improved since the prior study. There is no pneumothorax. No large pleural effusion. The heart size is stable from prior study. There is no acute osseous abnormality IMPRESSION: Improving streaky bibasilar airspace opacities compatible with improving pneumonia or atelectasis Electronically Signed   By: Constance Holster M.D.   On: 01/22/2019 03:46    Assessment and Plan:   Chest Pain with Elevated Troponin   - Patient transferred from [facility] ED for acute renal renal failure and markedly elevated LFTs after presenting with altered mental status that responded to Narcan. However, here patient reports intermittent chest pain for the last weeks possibly worse with more activity. Patient thinks it may be partly due to anxiety. - High sensitivity troponin elevated and flat at 314 >> 319. - EKG shows normal sinus rhythm with Q waves in leads V1-V2 and non-specific T wave flattening in aVL. - Echo pending. - BNP elevated in the 400's. Euvolemic on exam. - Currently chest pain free.  - Troponin flat and not consistent with ACS. Possibly demand ischemia in setting of AKI, possible pneumonia, and possible acute hepatitis. Patient has no history of HTN, HLD, or DM; however, she does have a 16 year smoking history. If renal function improves, would be a good candidate for coronary CT.  AKI - Creatinine reportedly 3.1 in [place] ED. 1.8 on recheck here. - Urinalysis, urine creatinine, and urine sodium pending. - Avoid nephrotoxic agents.  - Continue to monitor closely.  Of note, H&P mentions elevated D-dimer but I do not see this lab.   Otherwise, per primary team. - Altered mental status: Improved with Narcan - Pancytopenia - Elevated LFTs - Possible PNA - Myositis  - History of IV drug use  - History of seizures: On Keppra  For questions or updates, please contact [place] Please consult [email or web address] for contact info under     Signed, Darreld Mclean, PA-C  01/22/2019 7:57 AM

## 2019-01-22 NOTE — Consult Note (Signed)
[facility] Primary [facility] Physician:  Patient, No Pcp Per Primary Gastroenterologist: Unassigned  Reason for Consultation: Abnormal LFTs  HPI: Julie Sanford is a 38 y.o. female with past medical history of IV drug abuse, history of seizure disorder and bipolar disorder was [facility] further evaluation of multiple issues.  She was found to have elevated LFTs.  GI is consulted for further evaluation.  Cardiology is also consulted for elevated troponins.  Patient initially was found to have altered mental status which resolved with use of Narcan.  Blood work yesterday showed AST of 1354, ALT of 1036.  Normal T bili and alkaline phosphatase.  Normal INR.  Negative acetaminophen level.  CBC showed microcytic anemia with hemoglobin of 7.9, platelets of 62.  Patient seen and examined at bedside.  She was having some dark stool for few days but has no bowel movement in last 2 days.  She is complaining of acid reflux.  Complaining of lower abdominal discomfort.  Complaining of nausea.  Denies any vomiting.  History of diarrhea alternating with constipation.   Past Medical History:  Diagnosis Date  . Bipolar 1 disorder ([facility])   . Depression   . Herpes   . Seizures ([facility])     Past Surgical History:  Procedure Laterality Date  . CESAREAN SECTION      Prior to Admission medications   Medication Sig Start Date End Date Taking? Authorizing Provider  albuterol (PROVENTIL HFA;VENTOLIN HFA) 108 (90 Base) MCG/ACT inhaler Inhale 2 puffs into the lungs every 4 (four) hours as needed for wheezing or shortness of breath. 02/18/18   Armandina StammerNwoko, Agnes I, NP  gabapentin (NEURONTIN) 300 MG capsule Take 1 capsule (300 mg total) by mouth 3 (three) times daily. Agitation 02/18/18   Armandina StammerNwoko, Agnes I, NP  hydrOXYzine (ATARAX/VISTARIL) 50 MG tablet Take 2 tablets (100 mg total) by mouth 3 (three) times daily as needed for itching or anxiety. 02/18/18   Armandina StammerNwoko, Agnes I, NP   levETIRAcetam (KEPPRA) 500 MG tablet Take 1 tablet (500 mg total) by mouth 2 (two) times daily. For seizures 02/18/18   Armandina StammerNwoko, Agnes I, NP  mirtazapine (REMERON SOL-TAB) 45 MG disintegrating tablet Take 1 tablet (45 mg total) by mouth at bedtime. For depression 02/18/18   Armandina StammerNwoko, Agnes I, NP  nicotine (NICODERM CQ - DOSED IN MG/24 HOURS) 21 mg/24hr patch Place 1 patch (21 mg total) onto the skin daily. (May buy from over the counter): For smoking cessation 02/18/18   Armandina StammerNwoko, Agnes I, NP  sertraline (ZOLOFT) 50 MG tablet Take 1 tablet (50 mg total) by mouth daily. For depression 02/19/18   Armandina StammerNwoko, Agnes I, NP    Scheduled Meds: . aspirin EC  81 mg Oral Daily  . levETIRAcetam  500 mg Oral BID   Continuous Infusions: . ceFEPime (MAXIPIME) IV 2 g (01/22/19 0745)   PRN Meds:.buprenorphine  Allergies as of 01/21/2019 - Review Complete 01/21/2019  Allergen Reaction Noted  . Quetiapine Other (See Comments) 03/17/2016  . Tramadol Other (See Comments) 04/22/2011  . Penicillins Hives 08/24/2016    Family History  Problem Relation Age of Onset  . Diabetes Sister     Social History   Socioeconomic History  . Marital status: Legally Separated    Spouse name: Not on file  . Number of children: Not on file  . Years of education: Not on file  . Highest education level: Not on file  Occupational History  . Not on  file  Tobacco Use  . Smoking status: Current Every Day Smoker    Packs/day: 2.00    Types: Cigarettes  . Smokeless tobacco: Never Used  Substance and Sexual Activity  . Alcohol use: No  . Drug use: Yes    Types: Heroin    Comment: was clean for 6 mths, using x 1 week daily  . Sexual activity: Never    Comment: heroin  Other Topics Concern  . Not on file  Social History Narrative  . Not on file   Social Determinants of Health   Financial Resource Strain:   . Difficulty of Paying Living Expenses: Not on file  Food Insecurity:   . Worried About Programme researcher, broadcasting/film/video in the Last  Year: Not on file  . Ran Out of Food in the Last Year: Not on file  Transportation Needs:   . Lack of Transportation (Medical): Not on file  . Lack of Transportation (Non-Medical): Not on file  Physical Activity:   . Days of Exercise per Week: Not on file  . Minutes of Exercise per Session: Not on file  Stress:   . Feeling of Stress : Not on file  Social Connections:   . Frequency of Communication with Friends and Family: Not on file  . Frequency of Social Gatherings with Friends and Family: Not on file  . Attends Religious Services: Not on file  . Active Member of Clubs or Organizations: Not on file  . Attends Banker Meetings: Not on file  . Marital Status: Not on file  Intimate Partner Violence:   . Fear of Current or Ex-Partner: Not on file  . Emotionally Abused: Not on file  . Physically Abused: Not on file  . Sexually Abused: Not on file    Review of Systems: Review of Systems  Constitutional: Negative for chills and fever.  HENT: Negative for hearing loss and tinnitus.   Eyes: Negative for blurred vision and double vision.  Respiratory: Negative for cough and hemoptysis.   Cardiovascular: Negative for chest pain and palpitations.  Gastrointestinal: Positive for abdominal pain, constipation, diarrhea, heartburn and nausea.  Genitourinary: Negative for dysuria and urgency.  Musculoskeletal: Positive for back pain, joint pain and myalgias.  Skin: Negative for itching and rash.  Neurological: Positive for dizziness and loss of consciousness.  Endo/Heme/Allergies: Bruises/bleeds easily.  Psychiatric/Behavioral: Positive for substance abuse. Negative for memory loss.    Physical Exam: Vital signs: Vitals:   01/21/19 2213 01/22/19 0544  BP: (!) 160/105 (!) 135/92  Pulse: 87 91  Resp: 16   Temp: (!) 97.4 F (36.3 C) 98.3 F (36.8 C)  SpO2: 92% 98%     Physical Exam  Constitutional: She is oriented to person, place, and time. She appears well-developed  and well-nourished.  HENT:  Head: Normocephalic and atraumatic.  Mouth/Throat: No oropharyngeal exudate.  Eyes: EOM are normal. No scleral icterus.  Cardiovascular: Normal rate, regular rhythm and normal heart sounds.  Pulmonary/Chest: Effort normal. No respiratory distress.  Abdominal: Soft. Bowel sounds are normal. She exhibits no distension. There is no abdominal tenderness. There is no rebound and no guarding.  Musculoskeletal:        General: No edema. Normal range of motion.     Cervical back: Normal range of motion and neck supple.  Neurological: She is alert and oriented to person, place, and time.  Skin: Skin is warm. No erythema.  Psychiatric: She has a normal mood and affect. Judgment and thought content normal.  Vitals reviewed.   GI:  Lab Results: Recent Labs    01/21/19 2318  WBC 2.8*  HGB 7.9*  HCT 26.2*  PLT 62*   BMET Recent Labs    01/21/19 2318  NA 138  K 4.2  CL 105  CO2 22  GLUCOSE 85  BUN 25*  CREATININE 1.83*  CALCIUM 7.6*   LFT Recent Labs    01/21/19 2318  PROT 6.0*  ALBUMIN 3.2*  AST 1,354*  ALT 1,036*  ALKPHOS 65  BILITOT 0.8  BILIDIR 0.1  IBILI 0.7   PT/INR Recent Labs    01/21/19 2318  LABPROT 13.5  INR 1.0     Studies/Results: CT HEAD WO CONTRAST  Result Date: 01/22/2019 CLINICAL DATA:  Acute headache with normal neuro exam EXAM: CT HEAD WITHOUT CONTRAST TECHNIQUE: Contiguous axial images were obtained from the base of the skull through the vertex without intravenous contrast. COMPARISON:  09/09/2018 FINDINGS: Brain: No evidence of infarction, hemorrhage, hydrocephalus, extra-axial collection or mass lesion/mass effect. Vascular: Negative Skull: Negative for fracture or focal lesion. Atlantooccipital non segmentation Sinuses/Orbits: Negative IMPRESSION: Normal head CT. Electronically Signed   By: Monte Fantasia M.D.   On: 01/22/2019 04:13   DG Chest Port 1 View  Result Date: 01/22/2019 CLINICAL DATA:  Shortness of  breath. EXAM: PORTABLE CHEST 1 VIEW COMPARISON:  January 20, 2019 FINDINGS: There are streaky bibasilar airspace opacities which have improved since the prior study. There is no pneumothorax. No large pleural effusion. The heart size is stable from prior study. There is no acute osseous abnormality IMPRESSION: Improving streaky bibasilar airspace opacities compatible with improving pneumonia or atelectasis Electronically Signed   By: Constance Holster M.D.   On: 01/22/2019 03:46    Impression/Plan: -Abnormal LFTs with AST and ALT more than thousand.  Normal T bili and alkaline phosphatase.  Normal INR.  Most likely acute hepatitis either shock liver or hepatitis C from drug use. -Anemia.  Reported history of dark stools.  No bowel movements in last 2 days. -Thrombocytopenia.  Could be from alcohol use.  Question underlying cirrhosis. -IV drug use -Lower abdominal discomfort.  Chronic.  Recommendations ----------------------- -Follow hepatitis panel -Follow CT abdomen pelvis without contrast which has already been ordered. -Start Protonix IV 40 mg twice a day for reported history of dark stools.  She does not have any overt bleeding at this time -Monitor CBC, CMP and INR daily.  GI will follow.    LOS: 1 day   Otis Brace  MD, FACP 01/22/2019, 9:13 AM  Contact #  [phone]

## 2019-01-22 NOTE — Progress Notes (Signed)
  Echocardiogram 2D Echocardiogram has been performed.  Julie Sanford 01/22/2019, 10:48 AM

## 2019-01-22 NOTE — Progress Notes (Signed)
[facility] PROGRESS NOTE  Julie Sanford SEG:[id number] DOB: [birth year] DOA: 01/21/2019 PCP: Patient, No Pcp Per  Assessment/Plan:  Julie Sanford is a 38 y.o. female with history of IV drug abuse, seizures, bipolar disorder was brought to the ER at [facility] about 24 hours ago after patient was found to be in altered mental status and EMS was called by patient's mother.  At the ER patient responded well to Narcan but was hypoxic.  Prior to this patient had come to the ER 2 days ago with low back pain at the time, showing myositis of the lumbar area was placed on Keflex. Patient states she has been feeling weak since then of the lower extremities.  Her legs were giving away.  Denies any chest pain shortness of breath has been having some headaches.  States the last time she took IV drugs was almost a month ago.  Labs done at the ER showed markedly elevated LFTs with AST around 2600 and ALT 1200 INR was normal creatinine was 3.1 which increased from baseline of normal albumin is normal chest x-ray showed infiltrates concerning for pneumonia patient was on 4 L oxygen COVID-19 was negative.  proBNP was 1400.  Given acute renal failure with markedly elevated LFTs plan was to transfer to [facility].  Dr. Marca Ancona gastroenterologist was consulted and agreed to see patient when patient arrives at [facility]  Acute renal failure. Unclear etiology. CT abdomen: no hydronephrosis.  -cont iv fluids, monitor I/o, urine output. Renal labs   Elevated AST. ALT. probable acute hepatitis. CT abdomen: no obvious liver pathology. Pend hepatitis viral panel. Check CK.   Lower extremity weakness with recent MRI showing myositis. neuro exam is non focal.  Pend T-spine MRI.  Possible pneumonia on empiric antibiotics follow cultures.  Anemia, thrombocytopenia, pancytopenia. Unclear etiology. wil lf/u peripheral smear . Repeat labs. No s/s of acute bleeding. D/w patient, consented for blood TF if  needed  IV drug abuse follow blood cultures and presently on empiric antibiotics.  Discussed with pharmacy to place patient on buprenorphine.  Elevated troponin with some chest pressure. Echo: EF 55 to 60%. The left ventricle has normal function. There is no left ventricular hypertrophy. The left ventricle has no regional wall motion abnormalities. Cont per cardiology   History of seizures on Keppra.  Abnormal TSH. Check free t4.3   Code Status: full Family Communication: d/w patient, RN (indicate person spoken with, relationship, and if by phone, the number) Disposition Plan: remains inpatient    Consultants:  GI  Cardiology   Procedures:  none  Antibiotics: Anti-infectives (From admission, onward)   Start     Dose/Rate Route Frequency Ordered Stop   01/22/19 0600  ceFEPIme (MAXIPIME) 2 g in sodium chloride 0.9 % 100 mL IVPB     2 g 200 mL/hr over 30 Minutes Intravenous Every 24 hours 01/22/19 0427     01/22/19 0345  vancomycin (VANCOCIN) 1,250 mg in sodium chloride 0.9 % 250 mL IVPB     1,250 mg 166.7 mL/hr over 90 Minutes Intravenous  Once 01/22/19 0339 01/22/19 0700      (indicate start date, and stop date if known)  HPI/Subjective: No distress. Reports feeling well   Objective: Vitals:   01/21/19 2213 01/22/19 0544  BP: (!) 160/105 (!) 135/92  Pulse: 87 91  Resp: 16   Temp: (!) 97.4 F (36.3 C) 98.3 F (36.8 C)  SpO2: 92% 98%    Intake/Output Summary (Last 24 hours) at  01/22/2019 1212 Last data filed at 01/22/2019 1020 Gross per 24 hour  Intake 77.29 ml  Output --  Net 77.29 ml   Filed Weights   01/21/19 2213  Weight: 65 kg    Exam:   General:  No distress   Cardiovascular: s1,s2 rrr  Respiratory: CTA BL  Abdomen: soft, nt   Musculoskeletal: no leg edema    Data Reviewed: Basic Metabolic Panel: Recent Labs  Lab 01/21/19 2318  NA 138  K 4.2  CL 105  CO2 22  GLUCOSE 85  BUN 25*  CREATININE 1.83*  CALCIUM 7.6*  MG 2.1    Liver Function Tests: Recent Labs  Lab 01/21/19 2318  AST 1,354*  ALT 1,036*  ALKPHOS 65  BILITOT 0.8  PROT 6.0*  ALBUMIN 3.2*   No results for input(s): LIPASE, AMYLASE in the last 168 hours. No results for input(s): AMMONIA in the last 168 hours. CBC: Recent Labs  Lab 01/21/19 2318  WBC 2.8*  NEUTROABS 1.9  HGB 7.9*  HCT 26.2*  MCV 76.6*  PLT 62*   Cardiac Enzymes: No results for input(s): CKTOTAL, CKMB, CKMBINDEX, TROPONINI in the last 168 hours. BNP (last 3 results) Recent Labs    01/21/19 2321  BNP 485.9*    ProBNP (last 3 results) No results for input(s): PROBNP in the last 8760 hours.  CBG: No results for input(s): GLUCAP in the last 168 hours.  Recent Results (from the past 240 hour(s))  SARS CORONAVIRUS 2 (TAT 6-24 HRS) Nasopharyngeal Nasopharyngeal Swab     Status: None   Collection Time: 01/22/19  5:52 AM   Specimen: Nasopharyngeal Swab  Result Value Ref Range Status   SARS Coronavirus 2 NEGATIVE NEGATIVE Final    Comment: (NOTE) SARS-CoV-2 target nucleic acids are NOT DETECTED. The SARS-CoV-2 RNA is generally detectable in upper and lower respiratory specimens during the acute phase of infection. Negative results do not preclude SARS-CoV-2 infection, do not rule out co-infections with other pathogens, and should not be used as the sole basis for treatment or other patient management decisions. Negative results must be combined with clinical observations, patient history, and epidemiological information. The expected result is Negative. Fact Sheet for Patients: SugarRoll.be Fact Sheet for Healthcare Providers: https://www.woods-mathews.com/ This test is not yet approved or cleared by the [place] FDA and  has been authorized for detection and/or diagnosis of SARS-CoV-2 by FDA under an Emergency Use Authorization (EUA). This EUA will remain  in effect (meaning this test can be used) for the  duration of the COVID-19 declaration under Section 56 4(b)(1) of the Act, 21 U.S.C. section 360bbb-3(b)(1), unless the authorization is terminated or revoked sooner. Performed at [facility], [place] [address], [place], [place] [address]      Studies: CT ABDOMEN PELVIS WO CONTRAST  Result Date: 01/22/2019 CLINICAL DATA:  Abdominal distension EXAM: CT ABDOMEN AND PELVIS WITHOUT CONTRAST TECHNIQUE: Multidetector CT imaging of the abdomen and pelvis was performed following the standard protocol without IV contrast. COMPARISON:  None. FINDINGS: Lower chest: Small right pleural effusion. Right basilar hazy airspace disease likely reflecting atelectasis. Hepatobiliary: No focal liver abnormality is seen. No gallstones, gallbladder wall thickening, or biliary dilatation. Pancreas: Unremarkable. No pancreatic ductal dilatation or surrounding inflammatory changes. Spleen: Normal in size without focal abnormality. Adrenals/Urinary Tract: Adrenal glands are unremarkable. Kidneys are normal, without renal calculi, focal lesion, or hydronephrosis. Bladder is unremarkable. Stomach/Bowel: Stomach is within normal limits. Appendix appears normal. No evidence of bowel wall thickening, distention, or  inflammatory changes. Vascular/Lymphatic: No significant vascular findings are present. No enlarged abdominal or pelvic lymph nodes. Reproductive: Uterus and bilateral adnexa are unremarkable. Other: Moderate amount of pelvic free fluid. No abdominal wall hernia. Musculoskeletal: No acute osseous abnormality. No aggressive osseous lesion. IMPRESSION: 1. No evidence of bowel obstruction. 2. Moderate amount of pelvic free fluid of uncertain etiology. Electronically Signed   By: Elige KoHetal  Patel   On: 01/22/2019 11:52   CT HEAD WO CONTRAST  Result Date: 01/22/2019 CLINICAL DATA:  Acute headache with normal neuro exam EXAM: CT HEAD WITHOUT CONTRAST TECHNIQUE: Contiguous axial images were obtained from the base of the skull  through the vertex without intravenous contrast. COMPARISON:  09/09/2018 FINDINGS: Brain: No evidence of infarction, hemorrhage, hydrocephalus, extra-axial collection or mass lesion/mass effect. Vascular: Negative Skull: Negative for fracture or focal lesion. Atlantooccipital non segmentation Sinuses/Orbits: Negative IMPRESSION: Normal head CT. Electronically Signed   By: Marnee SpringJonathon  Watts M.D.   On: 01/22/2019 04:13   DG Chest Port 1 View  Result Date: 01/22/2019 CLINICAL DATA:  Shortness of breath. EXAM: PORTABLE CHEST 1 VIEW COMPARISON:  January 20, 2019 FINDINGS: There are streaky bibasilar airspace opacities which have improved since the prior study. There is no pneumothorax. No large pleural effusion. The heart size is stable from prior study. There is no acute osseous abnormality IMPRESSION: Improving streaky bibasilar airspace opacities compatible with improving pneumonia or atelectasis Electronically Signed   By: Katherine Mantlehristopher  Green M.D.   On: 01/22/2019 03:46    Scheduled Meds: . aspirin EC  81 mg Oral Daily  . levETIRAcetam  500 mg Oral BID   Continuous Infusions: . ceFEPime (MAXIPIME) IV 2 g (01/22/19 0745)    Principal Problem:   Acute renal failure (ARF) (HCC) Active Problems:   Heroin dependence (HCC)   Acute hepatitis   ARF (acute renal failure) (HCC)   Chest pain of uncertain etiology   Elevated troponin   SOB (shortness of breath)    Time spent: >25 minutes     Esperanza SheetsBURIEV, Sandralee Tarkington N  [facility] Pager [phone]. If 7PM-7AM, please contact night-coverage at [email or web address], password [email or web address] 01/22/2019, 12:12 PM  LOS: 1 day

## 2019-01-22 NOTE — Progress Notes (Signed)
Pharmacy Antibiotic Note  Julie Sanford is a 38 y.o. female admitted on 01/21/2019 with pneumonia.  Pharmacy has been consulted for vancomycin and cefepime dosing.   Pt w/ AKI, baseline SCr ~0.9, was 1.83 on admission (of note this has improved in the last 24h w/ SCr 2.4 at outside hospital ~12h ago) >> Scr improving to 1.29 this afternoon. Will schedule vancomycin dosing with close monitoring given AKI.   Plan: Vancomycin 1 g IV every 24 hours - starting 12/12@0600  Cefepime 2g IV every 12 hours Monitor renal fx, cx results, clinical pic, and vanc levels as appopriate  Height: 4\' 11"  (149.9 cm) Weight: 143 lb 6.4 oz (65 kg) IBW/kg (Calculated) : 43.2  Temp (24hrs), Avg:98 F (36.7 C), Min:97.4 F (36.3 C), Max:98.3 F (36.8 C)  Recent Labs  Lab 01/21/19 2318 01/22/19 1356  WBC 2.8*  --   CREATININE 1.83* 1.29*    Estimated Creatinine Clearance: 49.4 mL/min (A) (by C-G formula based on SCr of 1.29 mg/dL (H)).    Allergies  Allergen Reactions  . Quetiapine Other (See Comments)    Severe muscle spasms and hallucinations  . Tramadol Other (See Comments)    Muscle spasms, seizure and hallucinations  . Penicillins Hives    Can't remember, told as a child Has patient had a PCN reaction causing immediate rash, facial/tongue/throat swelling, SOB or lightheadedness with hypotension: no Has patient had a PCN reaction causing severe rash involving mucus membranes or skin necrosis: no Has patient had a PCN reaction that required hospitalization: no Has patient had a PCN reaction occurring within the last 10 years: no If all of the above answers are "NO", then may proceed with Cephalosporin use.     Thank you for allowing pharmacy to be a part of this patient's care.  Antonietta Jewel, PharmD, BCCCP Clinical Pharmacist  Phone: [phone]  Please check AMION for all [place] phone numbers After 10:00 PM, call [place] [phone] 01/22/2019 3:30 PM

## 2019-01-23 ENCOUNTER — Inpatient Hospital Stay: Payer: Self-pay

## 2019-01-23 DIAGNOSIS — R0789 Other chest pain: Secondary | ICD-10-CM

## 2019-01-23 DIAGNOSIS — F1120 Opioid dependence, uncomplicated: Secondary | ICD-10-CM

## 2019-01-23 LAB — COMPREHENSIVE METABOLIC PANEL
ALT: 688 U/L — ABNORMAL HIGH (ref 0–44)
AST: 567 U/L — ABNORMAL HIGH (ref 15–41)
Albumin: 2.8 g/dL — ABNORMAL LOW (ref 3.5–5.0)
Alkaline Phosphatase: 57 U/L (ref 38–126)
Anion gap: 11 (ref 5–15)
BUN: 16 mg/dL (ref 6–20)
CO2: 27 mmol/L (ref 22–32)
Calcium: 7.9 mg/dL — ABNORMAL LOW (ref 8.9–10.3)
Chloride: 104 mmol/L (ref 98–111)
Creatinine, Ser: 1.18 mg/dL — ABNORMAL HIGH (ref 0.44–1.00)
GFR calc Af Amer: >60 mL/min (ref >60)
GFR calc non Af Amer: 59 mL/min — ABNORMAL LOW (ref >60)
Glucose, Bld: 95 mg/dL (ref 70–99)
Potassium: 3.4 mmol/L — ABNORMAL LOW (ref 3.5–5.1)
Sodium: 142 mmol/L (ref 135–145)
Total Bilirubin: 1.1 mg/dL (ref 0.3–1.2)
Total Protein: 5.6 g/dL — ABNORMAL LOW (ref 6.5–8.1)

## 2019-01-23 LAB — CBC
HCT: 26.6 % — ABNORMAL LOW (ref 36.0–46.0)
Hemoglobin: 8.3 g/dL — ABNORMAL LOW (ref 12.0–15.0)
MCH: 23.4 pg — ABNORMAL LOW (ref 26.0–34.0)
MCHC: 31.2 g/dL (ref 30.0–36.0)
MCV: 74.9 fL — ABNORMAL LOW (ref 80.0–100.0)
Platelets: 234 10*3/uL (ref 150–400)
RBC: 3.55 MIL/uL — ABNORMAL LOW (ref 3.87–5.11)
RDW: 17.4 % — ABNORMAL HIGH (ref 11.5–15.5)
WBC: 3.2 10*3/uL — ABNORMAL LOW (ref 4.0–10.5)
nRBC: 0 % (ref 0.0–0.2)

## 2019-01-23 LAB — PROTIME-INR
INR: 1.1 (ref 0.8–1.2)
Prothrombin Time: 13.7 seconds (ref 11.4–15.2)

## 2019-01-23 MED ORDER — SODIUM CHLORIDE 0.9 % IV SOLN
INTRAVENOUS | Status: DC
  Administered 2019-01-23: 14:00:00 via INTRAVENOUS

## 2019-01-23 MED ORDER — POTASSIUM CHLORIDE CRYS ER 20 MEQ PO TBCR
40.0000 meq | EXTENDED_RELEASE_TABLET | Freq: Once | ORAL | Status: AC
  Administered 2019-01-23: 14:00:00 40 meq via ORAL
  Filled 2019-01-23: qty 2

## 2019-01-23 MED ORDER — LEVOTHYROXINE SODIUM 50 MCG PO TABS
50.0000 ug | ORAL_TABLET | Freq: Every day | ORAL | Status: DC
  Administered 2019-01-23 – 2019-01-24 (×2): 50 ug via ORAL
  Filled 2019-01-23 (×2): qty 1

## 2019-01-23 MED ORDER — SODIUM CHLORIDE 0.9 % IV SOLN
510.0000 mg | Freq: Once | INTRAVENOUS | Status: AC
  Administered 2019-01-23: 510 mg via INTRAVENOUS
  Filled 2019-01-23: qty 17

## 2019-01-23 MED ORDER — SODIUM CHLORIDE 0.9 % IV SOLN: 125.0000 mg | Freq: Once | INTRAVENOUS | Status: DC

## 2019-01-23 NOTE — Progress Notes (Signed)
Spoke with Yoko RN re PICC order.  Pt with hx IVDA, and impulsive, repeatedly pulling off leads and removing PIV herself has one PIV working well.  Yoko RN  to d/c PICC order, will attempt midline if further access needed.

## 2019-01-23 NOTE — Progress Notes (Signed)
Progress Note  Patient Name: Julie Sanford Date of Encounter: 01/23/2019  Primary Cardiologist: New  Subjective   Sharp chest pain with coughing  Inpatient Medications    Scheduled Meds: . aspirin EC  81 mg Oral Daily  . levETIRAcetam  500 mg Oral BID   Continuous Infusions: . sodium chloride 100 mL/hr at 01/22/19 1238  . ceFEPime (MAXIPIME) IV 2 g (01/22/19 2033)  . vancomycin 1,000 mg (01/23/19 0547)   PRN Meds: LORazepam, oxyCODONE   Vital Signs    Vitals:   01/22/19 0544 01/22/19 1456 01/22/19 2147 01/23/19 0542  BP: (!) 135/92 (!) 133/96 (!) 133/93 (!) 137/100  Pulse: 91 93 89 80  Resp:  17 (!) 21 (!) 25  Temp: 98.3 F (36.8 C) 98.2 F (36.8 C) 98.6 F (37 C) 98.1 F (36.7 C)  TempSrc: Oral Oral Oral Oral  SpO2: 98% 95% 96%   Weight:    64.7 kg  Height:        Intake/Output Summary (Last 24 hours) at 01/23/2019 0856 Last data filed at 01/22/2019 2033 Gross per 24 hour  Intake 600 ml  Output --  Net 600 ml   Last 3 Weights 01/23/2019 01/21/2019 01/21/2018  Weight (lbs) 142 lb 9.6 oz 143 lb 6.4 oz 120 lb  Weight (kg) 64.683 kg 65.046 kg 54.432 kg  Some encounter information is confidential and restricted. Go to Review Flowsheets activity to see all data.      Telemetry    NSR- Personally Reviewed  ECG    n/a - Personally Reviewed  Physical Exam   GEN: No acute distress.   Neck: No JVD Cardiac: RRR, no murmurs, rubs, or gallops.  Respiratory: Clear to auscultation bilaterally. GI: Soft, nontender, non-distended  MS: No edema; No deformity. Neuro:  Nonfocal  Psych: Normal affect   Labs    High Sensitivity Troponin:   Recent Labs  Lab 01/21/19 2318 01/22/19 0116  TROPONINIHS 314* 319*      Chemistry Recent Labs  Lab 01/21/19 2318 01/22/19 1356 01/23/19 0326  NA 138 137 142  K 4.2 3.4* 3.4*  CL 105 104 104  CO2 22 27 27   GLUCOSE 85 100* 95  BUN 25* 18 16  CREATININE 1.83* 1.29* 1.18*  CALCIUM 7.6* 7.6* 7.9*  PROT  6.0*  --  5.6*  ALBUMIN 3.2*  --  2.8*  AST 1,354*  --  567*  ALT 1,036*  --  688*  ALKPHOS 65  --  57  BILITOT 0.8  --  1.1  GFRNONAA 35* 53* 59*  GFRAA 40* >60 >60  ANIONGAP 11 6 11      Hematology Recent Labs  Lab 01/21/19 2318 01/22/19 0637 01/23/19 0651  WBC 2.8*  --  3.2*  RBC 3.42* 3.29* 3.55*  HGB 7.9*  --  8.3*  HCT 26.2*  --  26.6*  MCV 76.6*  --  74.9*  MCH 23.1*  --  23.4*  MCHC 30.2  --  31.2  RDW 18.0*  --  17.4*  PLT 62*  --  234    BNP Recent Labs  Lab 01/21/19 2321  BNP 485.9*     DDimer No results for input(s): DDIMER in the last 168 hours.   Radiology    CT ABDOMEN PELVIS WO CONTRAST  Result Date: 01/22/2019 CLINICAL DATA:  Abdominal distension EXAM: CT ABDOMEN AND PELVIS WITHOUT CONTRAST TECHNIQUE: Multidetector CT imaging of the abdomen and pelvis was performed following the standard protocol without IV contrast. COMPARISON:  None. FINDINGS: Lower chest: Small right pleural effusion. Right basilar hazy airspace disease likely reflecting atelectasis. Hepatobiliary: No focal liver abnormality is seen. No gallstones, gallbladder wall thickening, or biliary dilatation. Pancreas: Unremarkable. No pancreatic ductal dilatation or surrounding inflammatory changes. Spleen: Normal in size without focal abnormality. Adrenals/Urinary Tract: Adrenal glands are unremarkable. Kidneys are normal, without renal calculi, focal lesion, or hydronephrosis. Bladder is unremarkable. Stomach/Bowel: Stomach is within normal limits. Appendix appears normal. No evidence of bowel wall thickening, distention, or inflammatory changes. Vascular/Lymphatic: No significant vascular findings are present. No enlarged abdominal or pelvic lymph nodes. Reproductive: Uterus and bilateral adnexa are unremarkable. Other: Moderate amount of pelvic free fluid. No abdominal wall hernia. Musculoskeletal: No acute osseous abnormality. No aggressive osseous lesion. IMPRESSION: 1. No evidence of bowel  obstruction. 2. Moderate amount of pelvic free fluid of uncertain etiology. Electronically Signed   By: Elige Ko   On: 01/22/2019 11:52   CT HEAD WO CONTRAST  Result Date: 01/22/2019 CLINICAL DATA:  Acute headache with normal neuro exam EXAM: CT HEAD WITHOUT CONTRAST TECHNIQUE: Contiguous axial images were obtained from the base of the skull through the vertex without intravenous contrast. COMPARISON:  09/09/2018 FINDINGS: Brain: No evidence of infarction, hemorrhage, hydrocephalus, extra-axial collection or mass lesion/mass effect. Vascular: Negative Skull: Negative for fracture or focal lesion. Atlantooccipital non segmentation Sinuses/Orbits: Negative IMPRESSION: Normal head CT. Electronically Signed   By: Marnee Spring M.D.   On: 01/22/2019 04:13   MR THORACIC SPINE WO CONTRAST  Result Date: 01/22/2019 CLINICAL DATA:  Lower extremity weakness. Back pain. History of IV drug abuse. Recent abnormal MRI. EXAM: MRI THORACIC SPINE WITHOUT CONTRAST TECHNIQUE: Multiplanar, multisequence MR imaging of the thoracic spine was performed. No intravenous contrast was administered. COMPARISON:  MRI thoracic and lumbar spine with contrast 01/19/2019 FINDINGS: Alignment:  Normal Vertebrae: Negative for fracture or mass. No bone marrow edema. Hemangioma T7 vertebral body Cord:  Normal cord signal.  No cord lesion or cord compression Paraspinal and other soft tissues: Mild edema in the paraspinous muscles began approximately T9 and extending into the lumbar spine. This was present previously and appears slightly improved. No fluid collection in the soft tissues. Negative for abscess. Disc levels: Normal disc spaces. No disc degeneration or disc protrusion. Negative for spinal stenosis. IMPRESSION: Mild edema in the paraspinous muscles bilaterally in the lower thoracic and lumbar spine with interval improvement. No abscess or fluid collection Normal spinal canal.  No spinal stenosis or cord lesion identified.  Electronically Signed   By: Marlan Palau M.D.   On: 01/22/2019 20:01   DG Chest Port 1 View  Result Date: 01/22/2019 CLINICAL DATA:  Shortness of breath. EXAM: PORTABLE CHEST 1 VIEW COMPARISON:  January 20, 2019 FINDINGS: There are streaky bibasilar airspace opacities which have improved since the prior study. There is no pneumothorax. No large pleural effusion. The heart size is stable from prior study. There is no acute osseous abnormality IMPRESSION: Improving streaky bibasilar airspace opacities compatible with improving pneumonia or atelectasis Electronically Signed   By: Katherine Mantle M.D.   On: 01/22/2019 03:46   ECHOCARDIOGRAM COMPLETE  Result Date: 01/22/2019   ECHOCARDIOGRAM REPORT   Patient Name:   Julie Sanford Date of Exam: 01/22/2019 Medical Rec #:  [id number]       Height:       59.0 in Accession #:    [id number]      Weight:       143.4 lb Date of Birth:  November 24, 1980  BSA:          1.60 m Patient Age:    38 years        BP:           135/92 mmHg Patient Gender: F               HR:           90 bpm. Exam Location:  Inpatient Procedure: 2D Echo Indications:    elevated troponin  History:        Patient has no prior history of Echocardiogram examinations.                 Pneumonia; Risk Factors:IV drug use.  Sonographer:    Delcie RochLauren Pennington Referring Phys: [id number] ARSHAD N KAKRAKANDY IMPRESSIONS  1. Left ventricular ejection fraction, by visual estimation, is 55 to 60%. The left ventricle has normal function. There is no left ventricular hypertrophy.  2. The left ventricle has no regional wall motion abnormalities.  3. Global right ventricle has normal systolic function.The right ventricular size is normal. No increase in right ventricular wall thickness.  4. Left atrial size was normal.  5. Right atrial size was normal.  6. The mitral valve is normal in structure. Mild mitral valve regurgitation.  7. The tricuspid valve is normal in structure. Tricuspid valve regurgitation  mild-moderate.  8. The aortic valve is normal in structure. Aortic valve regurgitation is not visualized. No evidence of aortic valve sclerosis or stenosis.  9. Pulmonic regurgitation is mild. 10. The pulmonic valve was grossly normal. Pulmonic valve regurgitation is mild. 11. Moderately elevated pulmonary artery systolic pressure. 12. The atrial septum is grossly normal. FINDINGS  Left Ventricle: Left ventricular ejection fraction, by visual estimation, is 55 to 60%. The left ventricle has normal function. The left ventricle has no regional wall motion abnormalities. There is no left ventricular hypertrophy. Left ventricular diastolic parameters were normal. Right Ventricle: The right ventricular size is normal. No increase in right ventricular wall thickness. Global RV systolic function is has normal systolic function. The tricuspid regurgitant velocity is 3.07 m/s, and with an assumed right atrial pressure  of 8 mmHg, the estimated right ventricular systolic pressure is moderately elevated at 45.7 mmHg. Left Atrium: Left atrial size was normal in size. Right Atrium: Right atrial size was normal in size Pericardium: There is no evidence of pericardial effusion. Mitral Valve: The mitral valve is normal in structure. Mild mitral valve regurgitation. Tricuspid Valve: The tricuspid valve is normal in structure. Tricuspid valve regurgitation mild-moderate. Aortic Valve: The aortic valve is normal in structure. Aortic valve regurgitation is not visualized. The aortic valve is structurally normal, with no evidence of sclerosis or stenosis. Pulmonic Valve: The pulmonic valve was grossly normal. Pulmonic valve regurgitation is mild. Pulmonic regurgitation is mild. Aorta: The aortic root and ascending aorta are structurally normal, with no evidence of dilitation. IAS/Shunts: The atrial septum is grossly normal.  LEFT VENTRICLE PLAX 2D LVIDd:         4.70 cm  Diastology LVIDs:         3.00 cm  LV e' lateral:   9.57 cm/s LV  PW:         0.90 cm  LV E/e' lateral: 9.7 LV IVS:        0.80 cm  LV e' medial:    7.07 cm/s LVOT diam:     2.00 cm  LV E/e' medial:  13.2 LV SV:  67 ml LV SV Index:   40.34 LVOT Area:     3.14 cm  RIGHT VENTRICLE RV S prime:     16.80 cm/s TAPSE (M-mode): 3.0 cm LEFT ATRIUM             Index       RIGHT ATRIUM           Index LA diam:        3.70 cm 2.31 cm/m  RA Area:     13.60 cm LA Vol (A2C):   47.5 ml 29.67 ml/m RA Volume:   33.30 ml  20.80 ml/m LA Vol (A4C):   44.5 ml 27.79 ml/m LA Biplane Vol: 46.1 ml 28.79 ml/m  AORTIC VALVE LVOT Vmax:   137.00 cm/s LVOT Vmean:  99.000 cm/s LVOT VTI:    0.261 m  AORTA Ao Root diam: 3.40 cm MITRAL VALVE                         TRICUSPID VALVE MV Area (PHT): 4.63 cm              TR Peak grad:   37.7 mmHg MV PHT:        47.56 msec            TR Vmax:        307.00 cm/s MV Decel Time: 164 msec MV E velocity: 93.00 cm/s  103 cm/s  SHUNTS MV A velocity: 108.00 cm/s 70.3 cm/s Systemic VTI:  0.26 m MV E/A ratio:  0.86        1.5       Systemic Diam: 2.00 cm  Kristeen Miss MD Electronically signed by Kristeen Miss MD Signature Date/Time: 01/22/2019/12:23:53 PM    Final     Cardiac Studies     Patient Profile     SAHARRA SANTO is a 38 y.o. female with a history of IV drug abuse, seizures, bipolar disorder, and depression but no known cardiac history who is being seen today for the evaluation of elevated troponin at the request of Dr. Toniann Fail.  Assessment & Plan    1. Chest pain with elevated troponin - relatiely mild flat trop in setting of  - echo LVEF 55-60%, EKG withotu specific ischemic changes.   - today fairly atypical chest pain. Sharp, worst with coughing and position - follow at this time, I think her pain likely is more related to possible pnuemonia. Do not feel strongly about ischemic testing at this time, would need her multiple other acute issues to be resolved as well.    2. AKI - Cr is improving   3. Elevated transaminases -  per primary team, suspecting acute hepatitis  4. Lower extremity weakness - MRI signs of myositis  5. Pneumonia - abx per primary team  6. Pancytopenia - per primary team   For questions or updates, please contact [facility] Please consult [email or web address] for contact info under        Signed, Dina Rich, MD  01/23/2019, 8:56 AM

## 2019-01-23 NOTE — Progress Notes (Signed)
[facility] Progress Note  Julie SIMMERMAN 38 y.o. 12/30/1980   Subjective: Sleeping comfortably. Denies abdominal pain. Reports nausea without vomiting yesterday but none today.   Objective: Vital signs: Vitals:   01/22/19 2147 01/23/19 0542  BP: (!) 133/93 (!) 137/100  Pulse: 89 80  Resp: (!) 21 (!) 25  Temp: 98.6 F (37 C) 98.1 F (36.7 C)  SpO2: 96%     Physical Exam: Gen: somnolent, disheveled, no acute distress, well-nourished HEENT: anicteric sclera CV: RRR Chest: CTA B Abd: diffuse tenderness with guarding, soft, nondistended, +BS   Lab Results: Recent Labs    01/21/19 2318 01/22/19 1356 01/23/19 0326  NA 138 137 142  K 4.2 3.4* 3.4*  CL 105 104 104  CO2 22 27 27   GLUCOSE 85 100* 95  BUN 25* 18 16  CREATININE 1.83* 1.29* 1.18*  CALCIUM 7.6* 7.6* 7.9*  MG 2.1  --   --    Recent Labs    01/21/19 2318 01/23/19 0326  AST 1,354* 567*  ALT 1,036* 688*  ALKPHOS 65 57  BILITOT 0.8 1.1  PROT 6.0* 5.6*  ALBUMIN 3.2* 2.8*   Recent Labs    01/21/19 2318 01/23/19 0651  WBC 2.8* 3.2*  NEUTROABS 1.9  --   HGB 7.9* 8.3*  HCT 26.2* 26.6*  MCV 76.6* 74.9*  PLT 62* 234      Assessment/Plan: Elevated LFTs likely multifactorial from rhabdomyolysis and/or shock liver. Positive Hep C antibody. LFTs improving. Supportive care. On regular diet. No GI recs. Will sign off. Call if questions.   Lear Ng 01/23/2019, 10:30 AM  Questions please call [phone] ID: Karie Mainland, female   DOB: 05/10/1980, 38 y.o.   MRN: [id number]

## 2019-01-23 NOTE — Evaluation (Signed)
Physical Therapy Evaluation Patient Details Name: Julie Sanford MRN: [id number] DOB: [birth year] Today's Date: 01/23/2019   History of Present Illness  38yo female brought to [facility] via EMS secondary to AMS and weakness, found to be hypoxic and with pneumonia in ED. 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 Transferred to [facility] due to ARF with elevated LFTs. PMH seizures, bipolar disorder, IV drug abuse  Clinical Impression    Patient received in bed, pleasant and cooperative with therapy today; RN had previously cleared patient for participation even with IV in foot. Able to complete all functional bed mobility, transfers, and gait 67f (limited distance due to IV in foot) with full independence and reports she is back to her functional baseline. Mildly impulsive and requiring cues to wait for management of lines/leads today, possibly due to eagerness to go home. She was left in bed with all needs met and questions addressed. Do not feel she is in need of skilled PT services at this point- PT signing off, thank you for the referral!      Follow Up Recommendations No PT follow up    Equipment Recommendations  None recommended by PT    Recommendations for Other Services Other (comment)(mobility tech)     Precautions / Restrictions Precautions Precautions: None Restrictions Weight Bearing Restrictions: No      Mobility  Bed Mobility Overal bed mobility: Independent                Transfers Overall transfer level: Independent Equipment used: None                Ambulation/Gait Ambulation/Gait assistance: Independent Gait Distance (Feet): 20 Feet Assistive device: None Gait Pattern/deviations: WFL(Within Functional Limits);Step-through pattern Gait velocity: WNL   General Gait Details: limited gait due to IV In foot (RN cleared patient to still participate), all gait well within realm of normal with no significant deviation  Stairs             Wheelchair Mobility    Modified Rankin (Stroke Patients Only)       Balance Overall balance assessment: Independent                                           Pertinent Vitals/Pain Pain Assessment: 0-10 Pain Score: 8  Pain Location: back and head Pain Descriptors / Indicators: Aching;Sore Pain Intervention(s): Limited activity within patient's tolerance;Monitored during session;Repositioned    Home Living Family/patient expects to be discharged to:: Private residence Living Arrangements: Parent;Children Available Help at Discharge: Family;Available 24 hours/day Type of Home: House Home Access: Ramped entrance     Home Layout: One level Home Equipment: None      Prior Function Level of Independence: Independent               Hand Dominance        Extremity/Trunk Assessment   Upper Extremity Assessment Upper Extremity Assessment: Overall WFL for tasks assessed    Lower Extremity Assessment Lower Extremity Assessment: Overall WFL for tasks assessed    Cervical / Trunk Assessment Cervical / Trunk Assessment: Normal  Communication   Communication: No difficulties  Cognition Arousal/Alertness: Awake/alert Behavior During Therapy: WFL for tasks assessed/performed;Impulsive Overall Cognitive Status: Within Functional Limits for tasks assessed  General Comments: polite and cooperative but mildly impulsive, often getting up before PT was ready for her with equipment      General Comments General comments (skin integrity, edema, etc.): patient reports feeling back to her baseline    Exercises     Assessment/Plan    PT Assessment All further PT needs can be met in the next venue of care;Patent does not need any further PT services  PT Problem List Pain       PT Treatment Interventions      PT Goals (Current goals can be found in the Care Plan section)  Acute Rehab PT Goals Patient  Stated Goal: go home maybe tomorrow PT Goal Formulation: With patient Time For Goal Achievement: 02/06/19 Potential to Achieve Goals: Good    Frequency     Barriers to discharge        Co-evaluation               AM-PAC PT "6 Clicks" Mobility  Outcome Measure Help needed turning from your back to your side while in a flat bed without using bedrails?: None Help needed moving from lying on your back to sitting on the side of a flat bed without using bedrails?: None Help needed moving to and from a bed to a chair (including a wheelchair)?: None Help needed standing up from a chair using your arms (e.g., wheelchair or bedside chair)?: None Help needed to walk in hospital room?: None Help needed climbing 3-5 steps with a railing? : A Little 6 Click Score: 23    End of Session   Activity Tolerance: Patient tolerated treatment well Patient left: in bed;with call bell/phone within reach   PT Visit Diagnosis: Pain Pain - Right/Left: (back pain) Pain - part of body: (back pain)    Time: 7473-4037 PT Time Calculation (min) (ACUTE ONLY): 10 min   Charges:   PT Evaluation $PT Eval Moderate Complexity: 1 Mod          Windell Norfolk, DPT, PN1   Supplemental Physical Therapist [place]    Pager [phone] Acute Rehab Office [phone]

## 2019-01-23 NOTE — Progress Notes (Signed)
[facility] PROGRESS NOTE  Julie Sanford CWC:[id number] DOB: 12-03-1980 DOA: 01/21/2019 PCP: Patient, No Pcp Per  Assessment/Plan:  Julie Sanford is a 38 y.o. female with history of IV drug abuse, seizures, bipolar disorder was brought to the ER at [facility] about 24 hours ago after patient was found to be in altered mental status and EMS was called by patient's mother.  At the ER patient responded well to Narcan but was hypoxic.  Prior to this patient had come to the ER 2 days ago with low back pain at the time, showing myositis of the lumbar area was placed on Keflex. Patient states she has been feeling weak since then of the lower extremities.  Her legs were giving away.  Denies any chest pain shortness of breath has been having some headaches.  States the last time she took IV drugs was almost a month ago.  Labs done at the ER showed markedly elevated LFTs with AST around 2600 and ALT 1200 INR was normal creatinine was 3.1 which increased from baseline of normal albumin is normal chest x-ray showed infiltrates concerning for pneumonia patient was on 4 L oxygen COVID-19 was negative.  proBNP was 1400.  Given acute renal failure with markedly elevated LFTs plan was to transfer to [facility].  Dr. Therisa Doyne gastroenterologist was consulted and agreed to see patient when patient arrives at [facility].  Acute renal failure. Probable due to dehydration/mild rhabdo. CT abdomen: no hydronephrosis.  -improving, cont iv fluids, monitor I/o, urine output. Renal labs   Elevated AST. ALT. probable combination of hepatitis C+rhabdo. CT abdomen: no obvious liver pathology.  -improving, cont iv fluids for rhabdo. Needs gi follow up for hep C treatment at discharge   Hypothyroidism. Started on levothyroxine., recheck TFTs in 4 weeks.   Lower extremity weakness with MRI showing myositis. neuro exam is non focal. Obtain pt/ot -? Myositis due to hypothyroidism vs substance abuse. Will check  urine drug screen. Will need rheumatology follow up as outpatient for possible inflammatory myositis if urine tox is neg   Pneumonia on empiric antibiotics. Afebrile. monitor   Anemia, thrombocytopenia, pancytopenia. No s/s of acute bleeding. D/w patient, consented for blood TF and iv iron if needed -will TF iv iron. Monitor   IV drug abuse follow blood cultures and presently on empiric antibiotics.  Elevated troponin with some chest pressure. Echo: EF 55 to 60%. The left ventricle has normal function. There is no left ventricular hypertrophy. The left ventricle has no regional wall motion abnormalities. Cont per cardiology   History of seizures on Keppra.  Code Status: full Family Communication: d/w patient, RN (indicate person spoken with, relationship, and if by phone, the number) Disposition Plan: remains inpatient    Consultants:  GI  Cardiology   Procedures:  none  Antibiotics: Anti-infectives (From admission, onward)   Start     Dose/Rate Route Frequency Ordered Stop   01/23/19 0600  vancomycin (VANCOCIN) IVPB 1000 mg/200 mL premix  Status:  Discontinued     1,000 mg 200 mL/hr over 60 Minutes Intravenous  Once 01/22/19 1529 01/22/19 1537   01/23/19 0600  vancomycin (VANCOCIN) IVPB 1000 mg/200 mL premix     1,000 mg 200 mL/hr over 60 Minutes Intravenous Every 24 hours 01/22/19 1537     01/22/19 2000  ceFEPIme (MAXIPIME) 2 g in sodium chloride 0.9 % 100 mL IVPB     2 g 200 mL/hr over 30 Minutes Intravenous Every 12 hours 01/22/19 1533  01/22/19 0600  ceFEPIme (MAXIPIME) 2 g in sodium chloride 0.9 % 100 mL IVPB  Status:  Discontinued     2 g 200 mL/hr over 30 Minutes Intravenous Every 24 hours 01/22/19 0427 01/22/19 1533   01/22/19 0345  vancomycin (VANCOCIN) 1,250 mg in sodium chloride 0.9 % 250 mL IVPB     1,250 mg 166.7 mL/hr over 90 Minutes Intravenous  Once 01/22/19 0339 01/22/19 0700      (indicate start date, and stop date if  known)  HPI/Subjective: Feels better today. No distress. Noted to have elevated CK. Cont iv fluids. MRI with myositis. ? Related substance abuse. Will check urine drug screen   Objective: Vitals:   01/22/19 2147 01/23/19 0542  BP: (!) 133/93 (!) 137/100  Pulse: 89 80  Resp: (!) 21 (!) 25  Temp: 98.6 F (37 C) 98.1 F (36.7 C)  SpO2: 96%     Intake/Output Summary (Last 24 hours) at 01/23/2019 1037 Last data filed at 01/22/2019 2033 Gross per 24 hour  Intake 600 ml  Output --  Net 600 ml   Filed Weights   01/21/19 2213 01/23/19 0542  Weight: 65 kg 64.7 kg    Exam:   General:  No distress   Cardiovascular: s1,s2 rrr  Respiratory: CTA BL  Abdomen: soft, nt   Musculoskeletal: no leg edema    Data Reviewed: Basic Metabolic Panel: Recent Labs  Lab 01/21/19 2318 01/22/19 1356 01/23/19 0326  NA 138 137 142  K 4.2 3.4* 3.4*  CL 105 104 104  CO2 GLUCOSE 85 100* 95  BUN 25* 18 16  CREATININE 1.83* 1.29* 1.18*  CALCIUM 7.6* 7.6* 7.9*  MG 2.1  --   --    Liver Function Tests: Recent Labs  Lab 01/21/19 2318 01/23/19 0326  AST 1,354* 567*  ALT 1,036* 688*  ALKPHOS 65 57  BILITOT 0.8 1.1  PROT 6.0* 5.6*  ALBUMIN 3.2* 2.8*   No results for input(s): LIPASE, AMYLASE in the last 168 hours. No results for input(s): AMMONIA in the last 168 hours. CBC: Recent Labs  Lab 01/21/19 2318 01/23/19 0651  WBC 2.8* 3.2*  NEUTROABS 1.9  --   HGB 7.9* 8.3*  HCT 26.2* 26.6*  MCV 76.6* 74.9*  PLT 62* 234   Cardiac Enzymes: Recent Labs  Lab 01/22/19 1356  CKTOTAL 3,816*   BNP (last 3 results) Recent Labs    01/21/19 2321  BNP 485.9*    ProBNP (last 3 results) No results for input(s): PROBNP in the last 8760 hours.  CBG: No results for input(s): GLUCAP in the last 168 hours.  Recent Results (from the past 240 hour(s))  SARS CORONAVIRUS 2 (TAT 6-24 HRS) Nasopharyngeal Nasopharyngeal Swab     Status: None   Collection Time: 01/22/19  5:52 AM    Specimen: Nasopharyngeal Swab  Result Value Ref Range Status   SARS Coronavirus 2 NEGATIVE NEGATIVE Final    Comment: (NOTE) SARS-CoV-2 target nucleic acids are NOT DETECTED. The SARS-CoV-2 RNA is generally detectable in upper and lower respiratory specimens during the acute phase of infection. Negative results do not preclude SARS-CoV-2 infection, do not rule out co-infections with other pathogens, and should not be used as the sole basis for treatment or other patient management decisions. Negative results must be combined with clinical observations, patient history, and epidemiological information. The expected result is Negative. Fact Sheet for Patients: HairSlick.no Fact Sheet for Healthcare Providers: quierodirigir.com This test is not yet approved or  cleared by the [place] and  has been authorized for detection and/or diagnosis of SARS-CoV-2 by FDA under an Emergency Use Authorization (EUA). This EUA will remain  in effect (meaning this test can be used) for the duration of the COVID-19 declaration under Section 56 4(b)(1) of the Act, 21 U.S.C. section 360bbb-3(b)(1), unless the authorization is terminated or revoked sooner. Performed at [facility], [address], [place], [place] [address]      Studies: CT ABDOMEN PELVIS WO CONTRAST  Result Date: 01/22/2019 CLINICAL DATA:  Abdominal distension EXAM: CT ABDOMEN AND PELVIS WITHOUT CONTRAST TECHNIQUE: Multidetector CT imaging of the abdomen and pelvis was performed following the standard protocol without IV contrast. COMPARISON:  None. FINDINGS: Lower chest: Small right pleural effusion. Right basilar hazy airspace disease likely reflecting atelectasis. Hepatobiliary: No focal liver abnormality is seen. No gallstones, gallbladder wall thickening, or biliary dilatation. Pancreas: Unremarkable. No pancreatic ductal dilatation or surrounding inflammatory  changes. Spleen: Normal in size without focal abnormality. Adrenals/Urinary Tract: Adrenal glands are unremarkable. Kidneys are normal, without renal calculi, focal lesion, or hydronephrosis. Bladder is unremarkable. Stomach/Bowel: Stomach is within normal limits. Appendix appears normal. No evidence of bowel wall thickening, distention, or inflammatory changes. Vascular/Lymphatic: No significant vascular findings are present. No enlarged abdominal or pelvic lymph nodes. Reproductive: Uterus and bilateral adnexa are unremarkable. Other: Moderate amount of pelvic free fluid. No abdominal wall hernia. Musculoskeletal: No acute osseous abnormality. No aggressive osseous lesion. IMPRESSION: 1. No evidence of bowel obstruction. 2. Moderate amount of pelvic free fluid of uncertain etiology. Electronically Signed   By: Elige Ko   On: 01/22/2019 11:52   CT HEAD WO CONTRAST  Result Date: 01/22/2019 CLINICAL DATA:  Acute headache with normal neuro exam EXAM: CT HEAD WITHOUT CONTRAST TECHNIQUE: Contiguous axial images were obtained from the base of the skull through the vertex without intravenous contrast. COMPARISON:  09/09/2018 FINDINGS: Brain: No evidence of infarction, hemorrhage, hydrocephalus, extra-axial collection or mass lesion/mass effect. Vascular: Negative Skull: Negative for fracture or focal lesion. Atlantooccipital non segmentation Sinuses/Orbits: Negative IMPRESSION: Normal head CT. Electronically Signed   By: Marnee Spring M.D.   On: 01/22/2019 04:13   MR THORACIC SPINE WO CONTRAST  Result Date: 01/22/2019 CLINICAL DATA:  Lower extremity weakness. Back pain. History of IV drug abuse. Recent abnormal MRI. EXAM: MRI THORACIC SPINE WITHOUT CONTRAST TECHNIQUE: Multiplanar, multisequence MR imaging of the thoracic spine was performed. No intravenous contrast was administered. COMPARISON:  MRI thoracic and lumbar spine with contrast 01/19/2019 FINDINGS: Alignment:  Normal Vertebrae: Negative for  fracture or mass. No bone marrow edema. Hemangioma T7 vertebral body Cord:  Normal cord signal.  No cord lesion or cord compression Paraspinal and other soft tissues: Mild edema in the paraspinous muscles began approximately T9 and extending into the lumbar spine. This was present previously and appears slightly improved. No fluid collection in the soft tissues. Negative for abscess. Disc levels: Normal disc spaces. No disc degeneration or disc protrusion. Negative for spinal stenosis. IMPRESSION: Mild edema in the paraspinous muscles bilaterally in the lower thoracic and lumbar spine with interval improvement. No abscess or fluid collection Normal spinal canal.  No spinal stenosis or cord lesion identified. Electronically Signed   By: Marlan Palau M.D.   On: 01/22/2019 20:01   DG Chest Port 1 View  Result Date: 01/22/2019 CLINICAL DATA:  Shortness of breath. EXAM: PORTABLE CHEST 1 VIEW COMPARISON:  January 20, 2019 FINDINGS: There are streaky bibasilar airspace opacities which have  improved since the prior study. There is no pneumothorax. No large pleural effusion. The heart size is stable from prior study. There is no acute osseous abnormality IMPRESSION: Improving streaky bibasilar airspace opacities compatible with improving pneumonia or atelectasis Electronically Signed   By: Katherine Mantle M.D.   On: 01/22/2019 03:46   ECHOCARDIOGRAM COMPLETE  Result Date: 01/22/2019   ECHOCARDIOGRAM REPORT   Patient Name:   Julie Sanford Date of Exam: 01/22/2019 Medical Rec #:  [id number]       Height:       59.0 in Accession #:    [id number]      Weight:       143.4 lb Date of Birth:  01-Oct-1980       BSA:          1.60 m Patient Age:    38 years        BP:           135/92 mmHg Patient Gender: F               HR:           90 bpm. Exam Location:  Inpatient Procedure: 2D Echo Indications:    elevated troponin  History:        Patient has no prior history of Echocardiogram examinations.                  Pneumonia; Risk Factors:IV drug use.  Sonographer:    Delcie Roch Referring Phys: [id number] ARSHAD N KAKRAKANDY IMPRESSIONS  1. Left ventricular ejection fraction, by visual estimation, is 55 to 60%. The left ventricle has normal function. There is no left ventricular hypertrophy.  2. The left ventricle has no regional wall motion abnormalities.  3. Global right ventricle has normal systolic function.The right ventricular size is normal. No increase in right ventricular wall thickness.  4. Left atrial size was normal.  5. Right atrial size was normal.  6. The mitral valve is normal in structure. Mild mitral valve regurgitation.  7. The tricuspid valve is normal in structure. Tricuspid valve regurgitation mild-moderate.  8. The aortic valve is normal in structure. Aortic valve regurgitation is not visualized. No evidence of aortic valve sclerosis or stenosis.  9. Pulmonic regurgitation is mild. 10. The pulmonic valve was grossly normal. Pulmonic valve regurgitation is mild. 11. Moderately elevated pulmonary artery systolic pressure. 12. The atrial septum is grossly normal. FINDINGS  Left Ventricle: Left ventricular ejection fraction, by visual estimation, is 55 to 60%. The left ventricle has normal function. The left ventricle has no regional wall motion abnormalities. There is no left ventricular hypertrophy. Left ventricular diastolic parameters were normal. Right Ventricle: The right ventricular size is normal. No increase in right ventricular wall thickness. Global RV systolic function is has normal systolic function. The tricuspid regurgitant velocity is 3.07 m/s, and with an assumed right atrial pressure  of 8 mmHg, the estimated right ventricular systolic pressure is moderately elevated at 45.7 mmHg. Left Atrium: Left atrial size was normal in size. Right Atrium: Right atrial size was normal in size Pericardium: There is no evidence of pericardial effusion. Mitral Valve: The mitral valve is normal in  structure. Mild mitral valve regurgitation. Tricuspid Valve: The tricuspid valve is normal in structure. Tricuspid valve regurgitation mild-moderate. Aortic Valve: The aortic valve is normal in structure. Aortic valve regurgitation is not visualized. The aortic valve is structurally normal, with no evidence of sclerosis or stenosis. Pulmonic Valve: The pulmonic valve was grossly  normal. Pulmonic valve regurgitation is mild. Pulmonic regurgitation is mild. Aorta: The aortic root and ascending aorta are structurally normal, with no evidence of dilitation. IAS/Shunts: The atrial septum is grossly normal.  LEFT VENTRICLE PLAX 2D LVIDd:         4.70 cm  Diastology LVIDs:         3.00 cm  LV e' lateral:   9.57 cm/s LV PW:         0.90 cm  LV E/e' lateral: 9.7 LV IVS:        0.80 cm  LV e' medial:    7.07 cm/s LVOT diam:     2.00 cm  LV E/e' medial:  13.2 LV SV:         67 ml LV SV Index:   40.34 LVOT Area:     3.14 cm  RIGHT VENTRICLE RV S prime:     16.80 cm/s TAPSE (M-mode): 3.0 cm LEFT ATRIUM             Index       RIGHT ATRIUM           Index LA diam:        3.70 cm 2.31 cm/m  RA Area:     13.60 cm LA Vol (A2C):   47.5 ml 29.67 ml/m RA Volume:   33.30 ml  20.80 ml/m LA Vol (A4C):   44.5 ml 27.79 ml/m LA Biplane Vol: 46.1 ml 28.79 ml/m  AORTIC VALVE LVOT Vmax:   137.00 cm/s LVOT Vmean:  99.000 cm/s LVOT VTI:    0.261 m  AORTA Ao Root diam: 3.40 cm MITRAL VALVE                         TRICUSPID VALVE MV Area (PHT): 4.63 cm              TR Peak grad:   37.7 mmHg MV PHT:        47.56 msec            TR Vmax:        307.00 cm/s MV Decel Time: 164 msec MV E velocity: 93.00 cm/s  103 cm/s  SHUNTS MV A velocity: 108.00 cm/s 70.3 cm/s Systemic VTI:  0.26 m MV E/A ratio:  0.86        1.5       Systemic Diam: 2.00 cm  Julie MissPhilip Nahser MD Electronically signed by Julie MissPhilip Nahser MD Signature Date/Time: 01/22/2019/12:23:53 PM    Final     Scheduled Meds: . aspirin EC  81 mg Oral Daily  . levETIRAcetam  500 mg Oral BID    Continuous Infusions: . sodium chloride 100 mL/hr at 01/22/19 1238  . ceFEPime (MAXIPIME) IV 2 g (01/23/19 1008)  . vancomycin 1,000 mg (01/23/19 0547)    Principal Problem:   Acute renal failure (ARF) (HCC) Active Problems:   Heroin dependence (HCC)   Acute hepatitis   ARF (acute renal failure) (HCC)   Chest pain of uncertain etiology   Elevated troponin   SOB (shortness of breath)    Time spent: >25 minutes     Esperanza SheetsBURIEV, Shanigua Gibb N  [facility] Pager [phone]. If 7PM-7AM, please contact night-coverage at [email or web address], password [email or web address] 01/23/2019, 10:37 AM  LOS: 2 days

## 2019-01-24 LAB — COMPREHENSIVE METABOLIC PANEL
ALT: 513 U/L — ABNORMAL HIGH (ref 0–44)
AST: 300 U/L — ABNORMAL HIGH (ref 15–41)
Albumin: 2.9 g/dL — ABNORMAL LOW (ref 3.5–5.0)
Alkaline Phosphatase: 47 U/L (ref 38–126)
Anion gap: 9 (ref 5–15)
BUN: 11 mg/dL (ref 6–20)
CO2: 24 mmol/L (ref 22–32)
Calcium: 7.9 mg/dL — ABNORMAL LOW (ref 8.9–10.3)
Chloride: 107 mmol/L (ref 98–111)
Creatinine, Ser: 1.12 mg/dL — ABNORMAL HIGH (ref 0.44–1.00)
GFR calc Af Amer: >60 mL/min (ref >60)
GFR calc non Af Amer: >60 mL/min (ref >60)
Glucose, Bld: 81 mg/dL (ref 70–99)
Potassium: 4.1 mmol/L (ref 3.5–5.1)
Sodium: 140 mmol/L (ref 135–145)
Total Bilirubin: 1 mg/dL (ref 0.3–1.2)
Total Protein: 5.5 g/dL — ABNORMAL LOW (ref 6.5–8.1)

## 2019-01-24 LAB — CBC
HCT: 26.7 % — ABNORMAL LOW (ref 36.0–46.0)
Hemoglobin: 8.2 g/dL — ABNORMAL LOW (ref 12.0–15.0)
MCH: 23 pg — ABNORMAL LOW (ref 26.0–34.0)
MCHC: 30.7 g/dL (ref 30.0–36.0)
MCV: 75 fL — ABNORMAL LOW (ref 80.0–100.0)
Platelets: 243 10*3/uL (ref 150–400)
RBC: 3.56 MIL/uL — ABNORMAL LOW (ref 3.87–5.11)
RDW: 17.2 % — ABNORMAL HIGH (ref 11.5–15.5)
WBC: 3.4 10*3/uL — ABNORMAL LOW (ref 4.0–10.5)
nRBC: 0 % (ref 0.0–0.2)

## 2019-01-24 LAB — CK: Total CK: 2088 U/L — ABNORMAL HIGH (ref 38–234)

## 2019-01-24 MED ORDER — METOPROLOL TARTRATE 12.5 MG HALF TABLET
12.5000 mg | ORAL_TABLET | Freq: Two times a day (BID) | ORAL | Status: DC
  Administered 2019-01-24: 12.5 mg via ORAL
  Filled 2019-01-24: qty 1

## 2019-01-24 NOTE — Progress Notes (Signed)
Pt signed AMA

## 2019-01-24 NOTE — Progress Notes (Signed)
PROGRESS NOTE    Julie Sanford  NWG:[id number] DOB: July 31, 1980 DOA: 01/21/2019 PCP: Patient, No Pcp Per   Brief Narrative:  Julie Sanford a 38 y.o.femalewithhistory of IV drug abuse, seizures, bipolar disorder was brought to the ER at [facility] about 24 hours ago after patient was found to be in altered mental status and EMS was called by patient's mother. At the ER patient responded well to Narcan but was hypoxic. Prior to this patient had come to the ER 2 days ago with low back pain at the time, showing myositis of the lumbar area was placed on Keflex. Patient states she has been feeling weak since then of the lower extremities. Her legs were giving away. Denies any chest pain shortness of breath has been having some headaches. States the last time she took IV drugs was almost a month ago. Labs done at the ER showed markedly elevated LFTs with AST around 2600 and ALT 1200 INR was normal creatinine was 3.1 which increased from baseline of normal albumin is normal chest x-ray showed infiltrates concerning for pneumonia patient was on 4 L oxygen COVID-19 was negative. proBNP was 1400. Given acute renal failure with markedly elevated LFTs plan was to transfer to [facility]. Dr. Marca Ancona gastroenterologist was consulted and agreed to see patient when patient arrives at [facility].   Assessment & Plan:   Principal Problem:   Acute renal failure (ARF) (HCC) Active Problems:   Heroin dependence (HCC)   Acute hepatitis   ARF (acute renal failure) (HCC)   Chest pain of uncertain etiology   Elevated troponin   SOB (shortness of breath)   Acute renal failure. consistent with dehydration/mild rhabdo. CT abdomen: no hydronephrosis.  -improved 11/1.12, cont iv fluids but at lower rate 100 mils per hour, monitor I/o, urine output. Renal labs   Elevated AST. ALT. probable combination of hepatitis C+rhabdo. CT abdomen: no obvious liver pathology.  -improving, cont iv  fluids for rhabdo. Needs gi follow up for hep C treatment at discharge   Hypothyroidism. Started on levothyroxine., recheck TFTs in 4 weeks.   Lower extremity weakness with MRI showing myositis. neuro exam is non focal.  Follow-up pt/ot -? Myositis due to hypothyroidism vs substance abuse.  Follow-up urine drug screen.  -Will need rheumatology follow up as outpatient for possible inflammatory myositis if urine tox is neg   Pneumonia on empiric antibiotics. Afebrile. monitor   Anemia, thrombocytopenia, pancytopenia. No s/s of acute bleeding. D/w patient, consented for blood TF and iv iron if needed -will TF iv iron. Monitor   IV drug abuse follow blood cultures and presently on empiric antibiotics.  Elevated troponin with some chest pressure. Echo: EF 55 to 60%. The left ventricle has normal function. There is no left ventricular hypertrophy. The left ventricle has no regional wall motion abnormalities.  -Cardiology with plans for Lexiscan stress test in the morning  History of seizures on Keppra.  DVT prophylaxis: SCD/Compression stockings  Code Status: full    Code Status Orders  (From admission, onward)         Start     Ordered   01/21/19 2256  Full code  Continuous     01/21/19 2257        Code Status History    Date Active Date Inactive Code Status Order ID Comments User Context   02/12/2018 1356 02/18/2018 1622 Full Code [id number]  Maryagnes Amos, FNP Inpatient   01/28/2018 1040 01/29/2018 1509 Full Code [id number]  Renne Crigler, PA-C ED   11/03/2016 0956 11/07/2016 1938 Full Code [id number]  Pearson Grippe, MD ED   09/26/2016 0809 09/27/2016 1758 Full Code [id number]  Bobette Mo, MD Inpatient   09/04/2016 1314 09/06/2016 1607 Full Code [id number]  Alba Cory, MD Inpatient   08/24/2016 0839 08/28/2016 2114 Full Code [id number]  Jordan Hawks, DO Inpatient   10/24/2013 0714 10/25/2013 1802 Full Code [id number]  Ronni Rumble ED    04/17/2013 0531 04/17/2013 1815 Full Code [id number]  Olivia Mackie, MD ED   05/28/2011 0312 05/28/2011 1717 Full Code [id number]  Sunnie Nielsen, MD ED   04/22/2011 2028 04/23/2011 1552 Full Code [id number]  Patrecia Pour., PA ED   Advance Care Planning Activity     Family Communication: none today  Disposition Plan:   Patient remained inpatient for continued IV antibiotics, IV fluids for dehydration and continued cardiovascular surveillance with plans for stress test in the morning. Consults called: None Admission status: Inpatient   Consultants:   Cardiology  Procedures:  CT ABDOMEN PELVIS WO CONTRAST  Result Date: 01/22/2019 CLINICAL DATA:  Abdominal distension EXAM: CT ABDOMEN AND PELVIS WITHOUT CONTRAST TECHNIQUE: Multidetector CT imaging of the abdomen and pelvis was performed following the standard protocol without IV contrast. COMPARISON:  None. FINDINGS: Lower chest: Small right pleural effusion. Right basilar hazy airspace disease likely reflecting atelectasis. Hepatobiliary: No focal liver abnormality is seen. No gallstones, gallbladder wall thickening, or biliary dilatation. Pancreas: Unremarkable. No pancreatic ductal dilatation or surrounding inflammatory changes. Spleen: Normal in size without focal abnormality. Adrenals/Urinary Tract: Adrenal glands are unremarkable. Kidneys are normal, without renal calculi, focal lesion, or hydronephrosis. Bladder is unremarkable. Stomach/Bowel: Stomach is within normal limits. Appendix appears normal. No evidence of bowel wall thickening, distention, or inflammatory changes. Vascular/Lymphatic: No significant vascular findings are present. No enlarged abdominal or pelvic lymph nodes. Reproductive: Uterus and bilateral adnexa are unremarkable. Other: Moderate amount of pelvic free fluid. No abdominal wall hernia. Musculoskeletal: No acute osseous abnormality. No aggressive osseous lesion. IMPRESSION: 1. No evidence of bowel obstruction. 2. Moderate  amount of pelvic free fluid of uncertain etiology. Electronically Signed   By: Elige Ko   On: 01/22/2019 11:52   CT HEAD WO CONTRAST  Result Date: 01/22/2019 CLINICAL DATA:  Acute headache with normal neuro exam EXAM: CT HEAD WITHOUT CONTRAST TECHNIQUE: Contiguous axial images were obtained from the base of the skull through the vertex without intravenous contrast. COMPARISON:  09/09/2018 FINDINGS: Brain: No evidence of infarction, hemorrhage, hydrocephalus, extra-axial collection or mass lesion/mass effect. Vascular: Negative Skull: Negative for fracture or focal lesion. Atlantooccipital non segmentation Sinuses/Orbits: Negative IMPRESSION: Normal head CT. Electronically Signed   By: Marnee Spring M.D.   On: 01/22/2019 04:13   MR THORACIC SPINE WO CONTRAST  Result Date: 01/22/2019 CLINICAL DATA:  Lower extremity weakness. Back pain. History of IV drug abuse. Recent abnormal MRI. EXAM: MRI THORACIC SPINE WITHOUT CONTRAST TECHNIQUE: Multiplanar, multisequence MR imaging of the thoracic spine was performed. No intravenous contrast was administered. COMPARISON:  MRI thoracic and lumbar spine with contrast 01/19/2019 FINDINGS: Alignment:  Normal Vertebrae: Negative for fracture or mass. No bone marrow edema. Hemangioma T7 vertebral body Cord:  Normal cord signal.  No cord lesion or cord compression Paraspinal and other soft tissues: Mild edema in the paraspinous muscles began approximately T9 and extending into the lumbar spine. This was present previously and appears slightly improved. No fluid collection in the soft tissues. Negative for abscess. Disc levels: Normal  disc spaces. No disc degeneration or disc protrusion. Negative for spinal stenosis. IMPRESSION: Mild edema in the paraspinous muscles bilaterally in the lower thoracic and lumbar spine with interval improvement. No abscess or fluid collection Normal spinal canal.  No spinal stenosis or cord lesion identified. Electronically Signed   By:  Marlan Palau M.D.   On: 01/22/2019 20:01   DG Chest Port 1 View  Result Date: 01/22/2019 CLINICAL DATA:  Shortness of breath. EXAM: PORTABLE CHEST 1 VIEW COMPARISON:  January 20, 2019 FINDINGS: There are streaky bibasilar airspace opacities which have improved since the prior study. There is no pneumothorax. No large pleural effusion. The heart size is stable from prior study. There is no acute osseous abnormality IMPRESSION: Improving streaky bibasilar airspace opacities compatible with improving pneumonia or atelectasis Electronically Signed   By: Katherine Mantle M.D.   On: 01/22/2019 03:46   ECHOCARDIOGRAM COMPLETE  Result Date: 01/22/2019   ECHOCARDIOGRAM REPORT   Patient Name:   Julie Sanford Date of Exam: 01/22/2019 Medical Rec #:  [id number]       Height:       59.0 in Accession #:    [id number]      Weight:       143.4 lb Date of Birth:  November 16, 1980       BSA:          1.60 m Patient Age:    38 years        BP:           135/92 mmHg Patient Gender: F               HR:           90 bpm. Exam Location:  Inpatient Procedure: 2D Echo Indications:    elevated troponin  History:        Patient has no prior history of Echocardiogram examinations.                 Pneumonia; Risk Factors:IV drug use.  Sonographer:    Delcie Roch Referring Phys: [id number] ARSHAD N KAKRAKANDY IMPRESSIONS  1. Left ventricular ejection fraction, by visual estimation, is 55 to 60%. The left ventricle has normal function. There is no left ventricular hypertrophy.  2. The left ventricle has no regional wall motion abnormalities.  3. Global right ventricle has normal systolic function.The right ventricular size is normal. No increase in right ventricular wall thickness.  4. Left atrial size was normal.  5. Right atrial size was normal.  6. The mitral valve is normal in structure. Mild mitral valve regurgitation.  7. The tricuspid valve is normal in structure. Tricuspid valve regurgitation mild-moderate.  8. The aortic valve  is normal in structure. Aortic valve regurgitation is not visualized. No evidence of aortic valve sclerosis or stenosis.  9. Pulmonic regurgitation is mild. 10. The pulmonic valve was grossly normal. Pulmonic valve regurgitation is mild. 11. Moderately elevated pulmonary artery systolic pressure. 12. The atrial septum is grossly normal. FINDINGS  Left Ventricle: Left ventricular ejection fraction, by visual estimation, is 55 to 60%. The left ventricle has normal function. The left ventricle has no regional wall motion abnormalities. There is no left ventricular hypertrophy. Left ventricular diastolic parameters were normal. Right Ventricle: The right ventricular size is normal. No increase in right ventricular wall thickness. Global RV systolic function is has normal systolic function. The tricuspid regurgitant velocity is 3.07 m/s, and with an assumed right atrial pressure  of 8 mmHg, the estimated right  ventricular systolic pressure is moderately elevated at 45.7 mmHg. Left Atrium: Left atrial size was normal in size. Right Atrium: Right atrial size was normal in size Pericardium: There is no evidence of pericardial effusion. Mitral Valve: The mitral valve is normal in structure. Mild mitral valve regurgitation. Tricuspid Valve: The tricuspid valve is normal in structure. Tricuspid valve regurgitation mild-moderate. Aortic Valve: The aortic valve is normal in structure. Aortic valve regurgitation is not visualized. The aortic valve is structurally normal, with no evidence of sclerosis or stenosis. Pulmonic Valve: The pulmonic valve was grossly normal. Pulmonic valve regurgitation is mild. Pulmonic regurgitation is mild. Aorta: The aortic root and ascending aorta are structurally normal, with no evidence of dilitation. IAS/Shunts: The atrial septum is grossly normal.  LEFT VENTRICLE PLAX 2D LVIDd:         4.70 cm  Diastology LVIDs:         3.00 cm  LV e' lateral:   9.57 cm/s LV PW:         0.90 cm  LV E/e' lateral:  9.7 LV IVS:        0.80 cm  LV e' medial:    7.07 cm/s LVOT diam:     2.00 cm  LV E/e' medial:  13.2 LV SV:         67 ml LV SV Index:   40.34 LVOT Area:     3.14 cm  RIGHT VENTRICLE RV S prime:     16.80 cm/s TAPSE (M-mode): 3.0 cm LEFT ATRIUM             Index       RIGHT ATRIUM           Index LA diam:        3.70 cm 2.31 cm/m  RA Area:     13.60 cm LA Vol (A2C):   47.5 ml 29.67 ml/m RA Volume:   33.30 ml  20.80 ml/m LA Vol (A4C):   44.5 ml 27.79 ml/m LA Biplane Vol: 46.1 ml 28.79 ml/m  AORTIC VALVE LVOT Vmax:   137.00 cm/s LVOT Vmean:  99.000 cm/s LVOT VTI:    0.261 m  AORTA Ao Root diam: 3.40 cm MITRAL VALVE                         TRICUSPID VALVE MV Area (PHT): 4.63 cm              TR Peak grad:   37.7 mmHg MV PHT:        47.56 msec            TR Vmax:        307.00 cm/s MV Decel Time: 164 msec MV E velocity: 93.00 cm/s  103 cm/s  SHUNTS MV A velocity: 108.00 cm/s 70.3 cm/s Systemic VTI:  0.26 m MV E/A ratio:  0.86        1.5       Systemic Diam: 2.00 cm  Kristeen Miss MD Electronically signed by Kristeen Miss MD Signature Date/Time: 01/22/2019/12:23:53 PM    Final    [place] EKG SITE RITE  Result Date: 01/23/2019 If Site Rite image not attached, placement could not be confirmed due to current cardiac rhythm.    Antimicrobials:   Cefepime 12/11>   Subjective: No acute events overnight Hemodynamically stable Ready for discharge when medical work-up complete per patient  Objective: Vitals:   01/23/19 2102 01/24/19 0529 01/24/19 0844 01/24/19 0847  BP: Marland Kitchen)  150/100 (!) 151/100 (!) 134/97 (!) 134/97  Pulse: 76 74  95  Resp: 18 16    Temp: 99 F (37.2 C) 98.3 F (36.8 C)    TempSrc: Axillary Oral    SpO2: 98% 98%    Weight:  62.9 kg    Height:        Intake/Output Summary (Last 24 hours) at 01/24/2019 1333 Last data filed at 01/24/2019 1105 Gross per 24 hour  Intake 1182 ml  Output --  Net 1182 ml   Filed Weights   01/21/19 2213 01/23/19 0542 01/24/19 0529  Weight: 65 kg  64.7 kg 62.9 kg    Examination:  General exam: Appears calm and comfortable  Respiratory system: Clear to auscultation. Respiratory effort normal. Cardiovascular system: S1 & S2 heard, RRR. No JVD, murmurs, rubs, gallops or clicks. No pedal edema. Gastrointestinal system: Abdomen is nondistended, soft and nontender. No organomegaly or masses felt. Normal bowel sounds heard. Central nervous system: Alert and oriented. No focal neurological deficits. Extremities: Warm well perfused, neurovascularly intact Skin: No new draining rashes, lesions or ulcers Psychiatry: Judgement and insight appear normal. Mood & affect appropriate.     Data Reviewed: I have personally reviewed following labs and imaging studies  CBC: Recent Labs  Lab 01/21/19 2318 01/23/19 0651 01/24/19 0308  WBC 2.8* 3.2* 3.4*  NEUTROABS 1.9  --   --   HGB 7.9* 8.3* 8.2*  HCT 26.2* 26.6* 26.7*  MCV 76.6* 74.9* 75.0*  PLT 62* 234 322   Basic Metabolic Panel: Recent Labs  Lab 01/21/19 2318 01/22/19 1356 01/23/19 0326 01/24/19 0308  NA 138 137 142 140  K 4.2 3.4* 3.4* 4.1  CL 105 104 104 107  CO2 22 27 27 24   GLUCOSE 85 100* 95 81  BUN 25* 18 16 11   CREATININE 1.83* 1.29* 1.18* 1.12*  CALCIUM 7.6* 7.6* 7.9* 7.9*  MG 2.1  --   --   --    GFR: Estimated Creatinine Clearance: 56 mL/min (A) (by C-G formula based on SCr of 1.12 mg/dL (H)). Liver Function Tests: Recent Labs  Lab 01/21/19 2318 01/23/19 0326 01/24/19 0308  AST 1,354* 567* 300*  ALT 1,036* 688* 513*  ALKPHOS 65 57 47  BILITOT 0.8 1.1 1.0  PROT 6.0* 5.6* 5.5*  ALBUMIN 3.2* 2.8* 2.9*   No results for input(s): LIPASE, AMYLASE in the last 168 hours. No results for input(s): AMMONIA in the last 168 hours. Coagulation Profile: Recent Labs  Lab 01/21/19 2318 01/23/19 0326  INR 1.0 1.1   Cardiac Enzymes: Recent Labs  Lab 01/22/19 1356 01/24/19 0308  CKTOTAL 3,816* 2,088*   BNP (last 3 results) No results for input(s): PROBNP in  the last 8760 hours. HbA1C: No results for input(s): HGBA1C in the last 72 hours. CBG: No results for input(s): GLUCAP in the last 168 hours. Lipid Profile: No results for input(s): CHOL, HDL, LDLCALC, TRIG, CHOLHDL, LDLDIRECT in the last 72 hours. Thyroid Function Tests: Recent Labs    01/21/19 2321 01/22/19 1356  TSH 19.042*  --   FREET4  --  0.51*   Anemia Panel: Recent Labs    01/22/19 0637  VITAMINB12 575  FOLATE 23.9  FERRITIN 21  TIBC 335  IRON 11*  RETICCTPCT 1.3   Sepsis Labs: No results for input(s): PROCALCITON, LATICACIDVEN in the last 168 hours.  Recent Results (from the past 240 hour(s))  SARS CORONAVIRUS 2 (TAT 6-24 HRS) Nasopharyngeal Nasopharyngeal Swab     Status: None  Collection Time: 01/22/19  5:52 AM   Specimen: Nasopharyngeal Swab  Result Value Ref Range Status   SARS Coronavirus 2 NEGATIVE NEGATIVE Final    Comment: (NOTE) SARS-CoV-2 target nucleic acids are NOT DETECTED. The SARS-CoV-2 RNA is generally detectable in upper and lower respiratory specimens during the acute phase of infection. Negative results do not preclude SARS-CoV-2 infection, do not rule out co-infections with other pathogens, and should not be used as the sole basis for treatment or other patient management decisions. Negative results must be combined with clinical observations, patient history, and epidemiological information. The expected result is Negative. Fact Sheet for Patients: HairSlick.nohttps://www.fda.gov/media/138098/download Fact Sheet for Healthcare Providers: quierodirigir.comhttps://www.fda.gov/media/138095/download This test is not yet approved or cleared by the [place] FDA and  has been authorized for detection and/or diagnosis of SARS-CoV-2 by FDA under an Emergency Use Authorization (EUA). This EUA will remain  in effect (meaning this test can be used) for the duration of the COVID-19 declaration under Section 56 4(b)(1) of the Act, 21 U.S.C. section 360bbb-3(b)(1),  unless the authorization is terminated or revoked sooner. Performed at [facility], [address] [place], [place] [address]          Radiology Studies: MR THORACIC SPINE WO CONTRAST  Result Date: 01/22/2019 CLINICAL DATA:  Lower extremity weakness. Back pain. History of IV drug abuse. Recent abnormal MRI. EXAM: MRI THORACIC SPINE WITHOUT CONTRAST TECHNIQUE: Multiplanar, multisequence MR imaging of the thoracic spine was performed. No intravenous contrast was administered. COMPARISON:  MRI thoracic and lumbar spine with contrast 01/19/2019 FINDINGS: Alignment:  Normal Vertebrae: Negative for fracture or mass. No bone marrow edema. Hemangioma T7 vertebral body Cord:  Normal cord signal.  No cord lesion or cord compression Paraspinal and other soft tissues: Mild edema in the paraspinous muscles began approximately T9 and extending into the lumbar spine. This was present previously and appears slightly improved. No fluid collection in the soft tissues. Negative for abscess. Disc levels: Normal disc spaces. No disc degeneration or disc protrusion. Negative for spinal stenosis. IMPRESSION: Mild edema in the paraspinous muscles bilaterally in the lower thoracic and lumbar spine with interval improvement. No abscess or fluid collection Normal spinal canal.  No spinal stenosis or cord lesion identified. Electronically Signed   By: Marlan Palauharles  Clark M.D.   On: 01/22/2019 20:01   US EKG SITE RITE  Result Date: 01/23/2019 If Site Rite image not attached, placement could not be confirmed due to current cardiac rhythm.       Scheduled Meds: . aspirin EC  81 mg Oral Daily  . levETIRAcetam  500 mg Oral BID  . levothyroxine  50 mcg Oral Q0600  . metoprolol tartrate  12.5 mg Oral BID   Continuous Infusions: . sodium chloride 125 mL/hr at 01/23/19 1359  . ceFEPime (MAXIPIME) IV 2 g (01/24/19 0845)  . vancomycin 1,000 mg (01/24/19 0526)     LOS: 3 days    Time spent: 35  min    Burke Keelshristopher Donyae Kilner, MD [facility]  If 7PM-7AM, please contact night-coverage  01/24/2019, 1:33 PM

## 2019-01-24 NOTE — Discharge Summary (Signed)
Physician Discharge Summary  Julie Sanford FAO:[id number] DOB: 11-Dec-1980 DOA: 01/21/2019  PCP: Patient, No Pcp Per  Admit date: 01/21/2019 Discharge date: 01/24/2019  Admitted From: Inpatient Disposition: LEFT AMA    Brief/Interim Summary: Julie Longfield Coxis a 38 y.o.femalewithhistory of IV drug abuse, seizures, bipolar disorder was brought to the ER at [facility] about 24 hours ago after patient was found to be in altered mental status and EMS was called by patient's mother. At the ER patient responded well to Narcan but was hypoxic. Prior to this patient had come to the ER 2 days ago with low back pain at the time, showing myositis of the lumbar area was placed on Keflex. Patient states she has been feeling weak since then of the lower extremities. Her legs were giving away. Denies any chest pain shortness of breath has been having some headaches. States the last time she took IV drugs was almost a month ago. Labs done at the ER showed markedly elevated LFTs with AST around 2600 and ALT 1200 INR was normal creatinine was 3.1 which increased from baseline of normal albumin is normal chest x-ray showed infiltrates concerning for pneumonia patient was on 4 L oxygen COVID-19 was negative. proBNP was 1400. Given acute renal failure with markedly elevated LFTs plan was to transfer to [facility]. Dr. Marca Ancona gastroenterologist was consulted and agreed to see patient when patient arrives at [facility].  HOSPITAL COURSE AND PLAN AS BELOW, UNFORTUNATELY PT LEFT AMA  Acute renal failure.consistent with dehydration/mild rhabdo.CT abdomen: no hydronephrosis.  -improved 11/1.12,cont iv fluids but at lower rate 100 mils per hour, monitor I/o, urine output. Renal labs   Elevated AST. ALT.probable combination ofhepatitisC+rhabdo. CT abdomen: no obvious liver pathology. -improving, cont iv fluids for rhabdo. Needs gi follow up for hep C treatment at discharge    Hypothyroidism. Started on levothyroxine., recheck TFTs in 4 weeks.  Lower extremity weakness with MRI showing myositis. neuro exam is non focal.  Follow-up pt/ot -? Myositis due to hypothyroidism vs substance abuse.  Follow-up urine drug screen.  -Will need rheumatology follow up as outpatient for possible inflammatory myositis if urine tox is neg   Pneumonia on empiric antibiotics. Afebrile. monitor  Anemia, thrombocytopenia, pancytopenia.No s/s of acute bleeding. D/w patient, consented for blood TF and iv ironif needed -will TF iv iron. Monitor  IV drug abuse follow blood cultures and presently on empiric antibiotics.  Elevated troponinwith some chest pressure. Echo: EF 55 to 60%. The left ventricle has normal function. There is no left ventricular hypertrophy. The left ventricle has no regional wall motion abnormalities.  -Cardiology with plans for Lexiscan stress test in the morning  History of seizures on Keppra.  Discharge Diagnoses:  Principal Problem:   Acute renal failure (ARF) (HCC) Active Problems:   Heroin dependence (HCC)   Acute hepatitis   ARF (acute renal failure) (HCC)   Chest pain of uncertain etiology   Elevated troponin   SOB (shortness of breath)    Discharge Instructions    Follow-up Information    [facility] Call.   Specialty: Family Medicine Why: this location to establish a hospital follow up appointment within 1-2 weeks post discharge from hospital.  Contact information: [address] [place] [address] [phone]          Allergies  Allergen Reactions  . Quetiapine Other (See Comments)    Severe muscle spasms and hallucinations  . Tramadol Other (See Comments)    Muscle spasms, seizure  and hallucinations  . Penicillins Hives    Can't remember, told as a child Has patient had a PCN reaction causing immediate rash, facial/tongue/throat swelling, SOB or lightheadedness with hypotension: no Has  patient had a PCN reaction causing severe rash involving mucus membranes or skin necrosis: no Has patient had a PCN reaction that required hospitalization: no Has patient had a PCN reaction occurring within the last 10 years: no If all of the above answers are "NO", then may proceed with Cephalosporin use.     Consultations:  CARDS   Procedures/Studies: CT ABDOMEN PELVIS WO CONTRAST  Result Date: 01/22/2019 CLINICAL DATA:  Abdominal distension EXAM: CT ABDOMEN AND PELVIS WITHOUT CONTRAST TECHNIQUE: Multidetector CT imaging of the abdomen and pelvis was performed following the standard protocol without IV contrast. COMPARISON:  None. FINDINGS: Lower chest: Small right pleural effusion. Right basilar hazy airspace disease likely reflecting atelectasis. Hepatobiliary: No focal liver abnormality is seen. No gallstones, gallbladder wall thickening, or biliary dilatation. Pancreas: Unremarkable. No pancreatic ductal dilatation or surrounding inflammatory changes. Spleen: Normal in size without focal abnormality. Adrenals/Urinary Tract: Adrenal glands are unremarkable. Kidneys are normal, without renal calculi, focal lesion, or hydronephrosis. Bladder is unremarkable. Stomach/Bowel: Stomach is within normal limits. Appendix appears normal. No evidence of bowel wall thickening, distention, or inflammatory changes. Vascular/Lymphatic: No significant vascular findings are present. No enlarged abdominal or pelvic lymph nodes. Reproductive: Uterus and bilateral adnexa are unremarkable. Other: Moderate amount of pelvic free fluid. No abdominal wall hernia. Musculoskeletal: No acute osseous abnormality. No aggressive osseous lesion. IMPRESSION: 1. No evidence of bowel obstruction. 2. Moderate amount of pelvic free fluid of uncertain etiology. Electronically Signed   By: Elige Ko   On: 01/22/2019 11:52   CT HEAD WO CONTRAST  Result Date: 01/22/2019 CLINICAL DATA:  Acute headache with normal neuro exam  EXAM: CT HEAD WITHOUT CONTRAST TECHNIQUE: Contiguous axial images were obtained from the base of the skull through the vertex without intravenous contrast. COMPARISON:  09/09/2018 FINDINGS: Brain: No evidence of infarction, hemorrhage, hydrocephalus, extra-axial collection or mass lesion/mass effect. Vascular: Negative Skull: Negative for fracture or focal lesion. Atlantooccipital non segmentation Sinuses/Orbits: Negative IMPRESSION: Normal head CT. Electronically Signed   By: Marnee Spring M.D.   On: 01/22/2019 04:13   MR THORACIC SPINE WO CONTRAST  Result Date: 01/22/2019 CLINICAL DATA:  Lower extremity weakness. Back pain. History of IV drug abuse. Recent abnormal MRI. EXAM: MRI THORACIC SPINE WITHOUT CONTRAST TECHNIQUE: Multiplanar, multisequence MR imaging of the thoracic spine was performed. No intravenous contrast was administered. COMPARISON:  MRI thoracic and lumbar spine with contrast 01/19/2019 FINDINGS: Alignment:  Normal Vertebrae: Negative for fracture or mass. No bone marrow edema. Hemangioma T7 vertebral body Cord:  Normal cord signal.  No cord lesion or cord compression Paraspinal and other soft tissues: Mild edema in the paraspinous muscles began approximately T9 and extending into the lumbar spine. This was present previously and appears slightly improved. No fluid collection in the soft tissues. Negative for abscess. Disc levels: Normal disc spaces. No disc degeneration or disc protrusion. Negative for spinal stenosis. IMPRESSION: Mild edema in the paraspinous muscles bilaterally in the lower thoracic and lumbar spine with interval improvement. No abscess or fluid collection Normal spinal canal.  No spinal stenosis or cord lesion identified. Electronically Signed   By: Marlan Palau M.D.   On: 01/22/2019 20:01   DG Chest Port 1 View  Result Date: 01/22/2019 CLINICAL DATA:  Shortness of breath. EXAM: PORTABLE CHEST 1 VIEW COMPARISON:  January 20, 2019 FINDINGS: There are streaky  bibasilar airspace opacities which have improved since the prior study. There is no pneumothorax. No large pleural effusion. The heart size is stable from prior study. There is no acute osseous abnormality IMPRESSION: Improving streaky bibasilar airspace opacities compatible with improving pneumonia or atelectasis Electronically Signed   By: Katherine Mantle M.D.   On: 01/22/2019 03:46   ECHOCARDIOGRAM COMPLETE  Result Date: 01/22/2019   ECHOCARDIOGRAM REPORT   Patient Name:   PATRCIA SCHNEPP Tyrell Date of Exam: 01/22/2019 Medical Rec #:  [id number]       Height:       59.0 in Accession #:    [id number]      Weight:       143.4 lb Date of Birth:  [birth year]       BSA:          1.60 m Patient Age:    38 years        BP:           135/92 mmHg Patient Gender: F               HR:           90 bpm. Exam Location:  Inpatient Procedure: 2D Echo Indications:    elevated troponin  History:        Patient has no prior history of Echocardiogram examinations.                 Pneumonia; Risk Factors:IV drug use.  Sonographer:    Delcie Roch Referring Phys: [id number] ARSHAD N KAKRAKANDY IMPRESSIONS  1. Left ventricular ejection fraction, by visual estimation, is 55 to 60%. The left ventricle has normal function. There is no left ventricular hypertrophy.  2. The left ventricle has no regional wall motion abnormalities.  3. Global right ventricle has normal systolic function.The right ventricular size is normal. No increase in right ventricular wall thickness.  4. Left atrial size was normal.  5. Right atrial size was normal.  6. The mitral valve is normal in structure. Mild mitral valve regurgitation.  7. The tricuspid valve is normal in structure. Tricuspid valve regurgitation mild-moderate.  8. The aortic valve is normal in structure. Aortic valve regurgitation is not visualized. No evidence of aortic valve sclerosis or stenosis.  9. Pulmonic regurgitation is mild. 10. The pulmonic valve was grossly normal. Pulmonic valve  regurgitation is mild. 11. Moderately elevated pulmonary artery systolic pressure. 12. The atrial septum is grossly normal. FINDINGS  Left Ventricle: Left ventricular ejection fraction, by visual estimation, is 55 to 60%. The left ventricle has normal function. The left ventricle has no regional wall motion abnormalities. There is no left ventricular hypertrophy. Left ventricular diastolic parameters were normal. Right Ventricle: The right ventricular size is normal. No increase in right ventricular wall thickness. Global RV systolic function is has normal systolic function. The tricuspid regurgitant velocity is 3.07 m/s, and with an assumed right atrial pressure  of 8 mmHg, the estimated right ventricular systolic pressure is moderately elevated at 45.7 mmHg. Left Atrium: Left atrial size was normal in size. Right Atrium: Right atrial size was normal in size Pericardium: There is no evidence of pericardial effusion. Mitral Valve: The mitral valve is normal in structure. Mild mitral valve regurgitation. Tricuspid Valve: The tricuspid valve is normal in structure. Tricuspid valve regurgitation mild-moderate. Aortic Valve: The aortic valve is normal in structure. Aortic valve regurgitation is not visualized. The aortic valve is structurally normal, with no  evidence of sclerosis or stenosis. Pulmonic Valve: The pulmonic valve was grossly normal. Pulmonic valve regurgitation is mild. Pulmonic regurgitation is mild. Aorta: The aortic root and ascending aorta are structurally normal, with no evidence of dilitation. IAS/Shunts: The atrial septum is grossly normal.  LEFT VENTRICLE PLAX 2D LVIDd:         4.70 cm  Diastology LVIDs:         3.00 cm  LV e' lateral:   9.57 cm/s LV PW:         0.90 cm  LV E/e' lateral: 9.7 LV IVS:        0.80 cm  LV e' medial:    7.07 cm/s LVOT diam:     2.00 cm  LV E/e' medial:  13.2 LV SV:         67 ml LV SV Index:   40.34 LVOT Area:     3.14 cm  RIGHT VENTRICLE RV S prime:     16.80 cm/s  TAPSE (M-mode): 3.0 cm LEFT ATRIUM             Index       RIGHT ATRIUM           Index LA diam:        3.70 cm 2.31 cm/m  RA Area:     13.60 cm LA Vol (A2C):   47.5 ml 29.67 ml/m RA Volume:   33.30 ml  20.80 ml/m LA Vol (A4C):   44.5 ml 27.79 ml/m LA Biplane Vol: 46.1 ml 28.79 ml/m  AORTIC VALVE LVOT Vmax:   137.00 cm/s LVOT Vmean:  99.000 cm/s LVOT VTI:    0.261 m  AORTA Ao Root diam: 3.40 cm MITRAL VALVE                         TRICUSPID VALVE MV Area (PHT): 4.63 cm              TR Peak grad:   37.7 mmHg MV PHT:        47.56 msec            TR Vmax:        307.00 cm/s MV Decel Time: 164 msec MV E velocity: 93.00 cm/s  103 cm/s  SHUNTS MV A velocity: 108.00 cm/s 70.3 cm/s Systemic VTI:  0.26 m MV E/A ratio:  0.86        1.5       Systemic Diam: 2.00 cm  Kristeen MissPhilip Nahser MD Electronically signed by Kristeen MissPhilip Nahser MD Signature Date/Time: 01/22/2019/12:23:53 PM    Final    US EKG SITE RITE  Result Date: 01/23/2019 If Site Rite image not attached, placement could not be confirmed due to current cardiac rhythm.      Subjective: LEFT AMA, REFUSED TO STAY  Discharge Exam: Vitals:   01/24/19 0847 01/24/19 1546  BP: (!) 134/97 (!) 154/101  Pulse: 95 83  Resp:    Temp:  98 F (36.7 C)  SpO2:  100%   Vitals:   01/24/19 0529 01/24/19 0844 01/24/19 0847 01/24/19 1546  BP: (!) 151/100 (!) 134/97 (!) 134/97 (!) 154/101  Pulse: 74  95 83  Resp: 16     Temp: 98.3 F (36.8 C)   98 F (36.7 C)  TempSrc: Oral   Oral  SpO2: 98%   100%  Weight: 62.9 kg     Height:        General: Pt is alert, awake,  not in acute distress Cardiovascular: RRR, S1/S2 +, no rubs, no gallops Respiratory: CTA bilaterally, no wheezing, no rhonchi Abdominal: Soft, NT, ND, bowel sounds + Extremities: no edema, no cyanosis    The results of significant diagnostics from this hospitalization (including imaging, microbiology, ancillary and laboratory) are listed below for reference.     Microbiology: Recent  Results (from the past 240 hour(s))  SARS CORONAVIRUS 2 (TAT 6-24 HRS) Nasopharyngeal Nasopharyngeal Swab     Status: None   Collection Time: 01/22/19  5:52 AM   Specimen: Nasopharyngeal Swab  Result Value Ref Range Status   SARS Coronavirus 2 NEGATIVE NEGATIVE Final    Comment: (NOTE) SARS-CoV-2 target nucleic acids are NOT DETECTED. The SARS-CoV-2 RNA is generally detectable in upper and lower respiratory specimens during the acute phase of infection. Negative results do not preclude SARS-CoV-2 infection, do not rule out co-infections with other pathogens, and should not be used as the sole basis for treatment or other patient management decisions. Negative results must be combined with clinical observations, patient history, and epidemiological information. The expected result is Negative. Fact Sheet for Patients: SugarRoll.be Fact Sheet for Healthcare Providers: https://www.woods-mathews.com/ This test is not yet approved or cleared by the [place] FDA and  has been authorized for detection and/or diagnosis of SARS-CoV-2 by FDA under an Emergency Use Authorization (EUA). This EUA will remain  in effect (meaning this test can be used) for the duration of the COVID-19 declaration under Section 56 4(b)(1) of the Act, 21 U.S.C. section 360bbb-3(b)(1), unless the authorization is terminated or revoked sooner. Performed at [facility], [place] [address], [place], [place] [address]      Labs: BNP (last 3 results) Recent Labs    01/21/19 2321  BNP 026.3*   Basic Metabolic Panel: Recent Labs  Lab 01/21/19 2318 01/22/19 1356 01/23/19 0326 01/24/19 0308  NA 138 137 142 140  K 4.2 3.4* 3.4* 4.1  CL 105 104 104 107  CO2 22 27 27 24   GLUCOSE 85 100* 95 81  BUN 25* 18 16 11   CREATININE 1.83* 1.29* 1.18* 1.12*  CALCIUM 7.6* 7.6* 7.9* 7.9*  MG 2.1  --   --   --    Liver Function Tests: Recent Labs  Lab 01/21/19 2318  01/23/19 0326 01/24/19 0308  AST 1,354* 567* 300*  ALT 1,036* 688* 513*  ALKPHOS 65 57 47  BILITOT 0.8 1.1 1.0  PROT 6.0* 5.6* 5.5*  ALBUMIN 3.2* 2.8* 2.9*   No results for input(s): LIPASE, AMYLASE in the last 168 hours. No results for input(s): AMMONIA in the last 168 hours. CBC: Recent Labs  Lab 01/21/19 2318 01/23/19 0651 01/24/19 0308  WBC 2.8* 3.2* 3.4*  NEUTROABS 1.9  --   --   HGB 7.9* 8.3* 8.2*  HCT 26.2* 26.6* 26.7*  MCV 76.6* 74.9* 75.0*  PLT 62* 234 243   Cardiac Enzymes: Recent Labs  Lab 01/22/19 1356 01/24/19 0308  CKTOTAL 3,816* 2,088*   BNP: Invalid input(s): POCBNP CBG: No results for input(s): GLUCAP in the last 168 hours. D-Dimer No results for input(s): DDIMER in the last 72 hours. Hgb A1c No results for input(s): HGBA1C in the last 72 hours. Lipid Profile No results for input(s): CHOL, HDL, LDLCALC, TRIG, CHOLHDL, LDLDIRECT in the last 72 hours. Thyroid function studies Recent Labs    01/21/19 2321  TSH 19.042*   Anemia work up Recent Labs    01/22/19 0637  VITAMINB12 575  FOLATE 23.9  FERRITIN  21  TIBC 335  IRON 11*  RETICCTPCT 1.3   Urinalysis    Component Value Date/Time   COLORURINE YELLOW 01/22/2019 0921   APPEARANCEUR CLEAR 01/22/2019 0921   LABSPEC 1.013 01/22/2019 0921   PHURINE 6.0 01/22/2019 0921   GLUCOSEU NEGATIVE 01/22/2019 0921   HGBUR LARGE (A) 01/22/2019 0921   BILIRUBINUR NEGATIVE 01/22/2019 0921   KETONESUR 20 (A) 01/22/2019 0921   PROTEINUR NEGATIVE 01/22/2019 0921   UROBILINOGEN 0.2 10/24/2013 0120   NITRITE NEGATIVE 01/22/2019 0921   LEUKOCYTESUR NEGATIVE 01/22/2019 0921   Sepsis Labs Invalid input(s): PROCALCITONIN,  WBC,  LACTICIDVEN Microbiology Recent Results (from the past 240 hour(s))  SARS CORONAVIRUS 2 (TAT 6-24 HRS) Nasopharyngeal Nasopharyngeal Swab     Status: None   Collection Time: 01/22/19  5:52 AM   Specimen: Nasopharyngeal Swab  Result Value Ref Range Status   SARS Coronavirus  2 NEGATIVE NEGATIVE Final    Comment: (NOTE) SARS-CoV-2 target nucleic acids are NOT DETECTED. The SARS-CoV-2 RNA is generally detectable in upper and lower respiratory specimens during the acute phase of infection. Negative results do not preclude SARS-CoV-2 infection, do not rule out co-infections with other pathogens, and should not be used as the sole basis for treatment or other patient management decisions. Negative results must be combined with clinical observations, patient history, and epidemiological information. The expected result is Negative. Fact Sheet for Patients: HairSlick.no Fact Sheet for Healthcare Providers: quierodirigir.com This test is not yet approved or cleared by the [place] FDA and  has been authorized for detection and/or diagnosis of SARS-CoV-2 by FDA under an Emergency Use Authorization (EUA). This EUA will remain  in effect (meaning this test can be used) for the duration of the COVID-19 declaration under Section 56 4(b)(1) of the Act, 21 U.S.C. section 360bbb-3(b)(1), unless the authorization is terminated or revoked sooner. Performed at [facility], [address] [place], [place] [address]      Time coordinating discharge: Over 30 minutes  SIGNED:   Burke Keels, MD  [facility] 01/24/2019, 4:05 PM Pager   If 7PM-7AM, please contact night-coverage [email or web address] Password [email or web address]

## 2019-01-24 NOTE — Progress Notes (Addendum)
Pt has been expressing to go home AMA.  She appeared to be extremely anxious.  She agreed to try to take ativan po to calm her self down to reconsider.  Pt verbalized understanding of importance of continue receiving care here for her medical condition.  Dr. Wyonia Hough made aware.  Will continue to monitor.

## 2019-01-24 NOTE — Progress Notes (Signed)
Progress Note  Patient Name: Julie FriesChristina M Sanford Date of Encounter: 01/24/2019  Primary Cardiologist: New  Subjective   Chest pain has resolved  Inpatient Medications    Scheduled Meds: . aspirin EC  81 mg Oral Daily  . levETIRAcetam  500 mg Oral BID  . levothyroxine  50 mcg Oral Q0600   Continuous Infusions: . sodium chloride 125 mL/hr at 01/23/19 1359  . ceFEPime (MAXIPIME) IV 2 g (01/23/19 2137)  . vancomycin 1,000 mg (01/24/19 0526)   PRN Meds: LORazepam, oxyCODONE   Vital Signs    Vitals:   01/23/19 0542 01/23/19 1631 01/23/19 2102 01/24/19 0529  BP: (!) 137/100 121/70 (!) 150/100 (!) 151/100  Pulse: 80 88 76 74  Resp: (!) 25 20 18 16   Temp: 98.1 F (36.7 C) 98.4 F (36.9 C) 99 F (37.2 C) 98.3 F (36.8 C)  TempSrc: Oral Oral Axillary Oral  SpO2:  98% 98% 98%  Weight: 64.7 kg   62.9 kg  Height:        Intake/Output Summary (Last 24 hours) at 01/24/2019 0808 Last data filed at 01/24/2019 0014 Gross per 24 hour  Intake 942 ml  Output --  Net 942 ml   Last 3 Weights 01/24/2019 01/23/2019 01/21/2019  Weight (lbs) 138 lb 9.6 oz 142 lb 9.6 oz 143 lb 6.4 oz  Weight (kg) 62.869 kg 64.683 kg 65.046 kg  Some encounter information is confidential and restricted. Go to Review Flowsheets activity to see all data.      Telemetry    SR, 18 beat run NSVT - Personally Reviewed  ECG    n/a - Personally Reviewed  Physical Exam   GEN: No acute distress.   Neck: No JVD Cardiac: RRR, no murmurs, rubs, or gallops.  Respiratory: Clear to auscultation bilaterally. GI: Soft, nontender, non-distended  MS: No edema; No deformity. Neuro:  Nonfocal  Psych: Normal affect   Labs    High Sensitivity Troponin:   Recent Labs  Lab 01/21/19 2318 01/22/19 0116  TROPONINIHS 314* 319*      Chemistry Recent Labs  Lab 01/21/19 2318 01/22/19 1356 01/23/19 0326 01/24/19 0308  NA 138 137 142 140  K 4.2 3.4* 3.4* 4.1  CL 105 104 104 107  CO2 22 27 27 24    GLUCOSE 85 100* 95 81  BUN 25* 18 16 11   CREATININE 1.83* 1.29* 1.18* 1.12*  CALCIUM 7.6* 7.6* 7.9* 7.9*  PROT 6.0*  --  5.6* 5.5*  ALBUMIN 3.2*  --  2.8* 2.9*  AST 1,354*  --  567* 300*  ALT 1,036*  --  688* 513*  ALKPHOS 65  --  57 47  BILITOT 0.8  --  1.1 1.0  GFRNONAA 35* 53* 59* >60  GFRAA 40* >60 >60 >60  ANIONGAP 11 6 11 9      Hematology Recent Labs  Lab 01/21/19 2318 01/22/19 0637 01/23/19 0651 01/24/19 0308  WBC 2.8*  --  3.2* 3.4*  RBC 3.42* 3.29* 3.55* 3.56*  HGB 7.9*  --  8.3* 8.2*  HCT 26.2*  --  26.6* 26.7*  MCV 76.6*  --  74.9* 75.0*  MCH 23.1*  --  23.4* 23.0*  MCHC 30.2  --  31.2 30.7  RDW 18.0*  --  17.4* 17.2*  PLT 62*  --  234 243    BNP Recent Labs  Lab 01/21/19 2321  BNP 485.9*     DDimer No results for input(s): DDIMER in the last 168 hours.   Radiology  CT ABDOMEN PELVIS WO CONTRAST  Result Date: 01/22/2019 CLINICAL DATA:  Abdominal distension EXAM: CT ABDOMEN AND PELVIS WITHOUT CONTRAST TECHNIQUE: Multidetector CT imaging of the abdomen and pelvis was performed following the standard protocol without IV contrast. COMPARISON:  None. FINDINGS: Lower chest: Small right pleural effusion. Right basilar hazy airspace disease likely reflecting atelectasis. Hepatobiliary: No focal liver abnormality is seen. No gallstones, gallbladder wall thickening, or biliary dilatation. Pancreas: Unremarkable. No pancreatic ductal dilatation or surrounding inflammatory changes. Spleen: Normal in size without focal abnormality. Adrenals/Urinary Tract: Adrenal glands are unremarkable. Kidneys are normal, without renal calculi, focal lesion, or hydronephrosis. Bladder is unremarkable. Stomach/Bowel: Stomach is within normal limits. Appendix appears normal. No evidence of bowel wall thickening, distention, or inflammatory changes. Vascular/Lymphatic: No significant vascular findings are present. No enlarged abdominal or pelvic lymph nodes. Reproductive: Uterus and  bilateral adnexa are unremarkable. Other: Moderate amount of pelvic free fluid. No abdominal wall hernia. Musculoskeletal: No acute osseous abnormality. No aggressive osseous lesion. IMPRESSION: 1. No evidence of bowel obstruction. 2. Moderate amount of pelvic free fluid of uncertain etiology. Electronically Signed   By: Kathreen Devoid   On: 01/22/2019 11:52   MR THORACIC SPINE WO CONTRAST  Result Date: 01/22/2019 CLINICAL DATA:  Lower extremity weakness. Back pain. History of IV drug abuse. Recent abnormal MRI. EXAM: MRI THORACIC SPINE WITHOUT CONTRAST TECHNIQUE: Multiplanar, multisequence MR imaging of the thoracic spine was performed. No intravenous contrast was administered. COMPARISON:  MRI thoracic and lumbar spine with contrast 01/19/2019 FINDINGS: Alignment:  Normal Vertebrae: Negative for fracture or mass. No bone marrow edema. Hemangioma T7 vertebral body Cord:  Normal cord signal.  No cord lesion or cord compression Paraspinal and other soft tissues: Mild edema in the paraspinous muscles began approximately T9 and extending into the lumbar spine. This was present previously and appears slightly improved. No fluid collection in the soft tissues. Negative for abscess. Disc levels: Normal disc spaces. No disc degeneration or disc protrusion. Negative for spinal stenosis. IMPRESSION: Mild edema in the paraspinous muscles bilaterally in the lower thoracic and lumbar spine with interval improvement. No abscess or fluid collection Normal spinal canal.  No spinal stenosis or cord lesion identified. Electronically Signed   By: Franchot Gallo M.D.   On: 01/22/2019 20:01   ECHOCARDIOGRAM COMPLETE  Result Date: 01/22/2019   ECHOCARDIOGRAM REPORT   Patient Name:   Julie Sanford Date of Exam: 01/22/2019 Medical Rec #:  [id number]       Height:       59.0 in Accession #:    [id number]      Weight:       143.4 lb Date of Birth:  12-21-1980       BSA:          1.60 m Patient Age:    38 years        BP:            135/92 mmHg Patient Gender: F               HR:           90 bpm. Exam Location:  Inpatient Procedure: 2D Echo Indications:    elevated troponin  History:        Patient has no prior history of Echocardiogram examinations.                 Pneumonia; Risk Factors:IV drug use.  Sonographer:    Johny Chess Referring Phys: [place]  1. Left ventricular ejection fraction, by visual estimation, is 55 to 60%. The left ventricle has normal function. There is no left ventricular hypertrophy.  2. The left ventricle has no regional wall motion abnormalities.  3. Global right ventricle has normal systolic function.The right ventricular size is normal. No increase in right ventricular wall thickness.  4. Left atrial size was normal.  5. Right atrial size was normal.  6. The mitral valve is normal in structure. Mild mitral valve regurgitation.  7. The tricuspid valve is normal in structure. Tricuspid valve regurgitation mild-moderate.  8. The aortic valve is normal in structure. Aortic valve regurgitation is not visualized. No evidence of aortic valve sclerosis or stenosis.  9. Pulmonic regurgitation is mild. 10. The pulmonic valve was grossly normal. Pulmonic valve regurgitation is mild. 11. Moderately elevated pulmonary artery systolic pressure. 12. The atrial septum is grossly normal. FINDINGS  Left Ventricle: Left ventricular ejection fraction, by visual estimation, is 55 to 60%. The left ventricle has normal function. The left ventricle has no regional wall motion abnormalities. There is no left ventricular hypertrophy. Left ventricular diastolic parameters were normal. Right Ventricle: The right ventricular size is normal. No increase in right ventricular wall thickness. Global RV systolic function is has normal systolic function. The tricuspid regurgitant velocity is 3.07 m/s, and with an assumed right atrial pressure  of 8 mmHg, the estimated right ventricular systolic pressure is  moderately elevated at 45.7 mmHg. Left Atrium: Left atrial size was normal in size. Right Atrium: Right atrial size was normal in size Pericardium: There is no evidence of pericardial effusion. Mitral Valve: The mitral valve is normal in structure. Mild mitral valve regurgitation. Tricuspid Valve: The tricuspid valve is normal in structure. Tricuspid valve regurgitation mild-moderate. Aortic Valve: The aortic valve is normal in structure. Aortic valve regurgitation is not visualized. The aortic valve is structurally normal, with no evidence of sclerosis or stenosis. Pulmonic Valve: The pulmonic valve was grossly normal. Pulmonic valve regurgitation is mild. Pulmonic regurgitation is mild. Aorta: The aortic root and ascending aorta are structurally normal, with no evidence of dilitation. IAS/Shunts: The atrial septum is grossly normal.  LEFT VENTRICLE PLAX 2D LVIDd:         4.70 cm  Diastology LVIDs:         3.00 cm  LV e' lateral:   9.57 cm/s LV PW:         0.90 cm  LV E/e' lateral: 9.7 LV IVS:        0.80 cm  LV e' medial:    7.07 cm/s LVOT diam:     2.00 cm  LV E/e' medial:  13.2 LV SV:         67 ml LV SV Index:   40.34 LVOT Area:     3.14 cm  RIGHT VENTRICLE RV S prime:     16.80 cm/s TAPSE (M-mode): 3.0 cm LEFT ATRIUM             Index       RIGHT ATRIUM           Index LA diam:        3.70 cm 2.31 cm/m  RA Area:     13.60 cm LA Vol (A2C):   47.5 ml 29.67 ml/m RA Volume:   33.30 ml  20.80 ml/m LA Vol (A4C):   44.5 ml 27.79 ml/m LA Biplane Vol: 46.1 ml 28.79 ml/m  AORTIC VALVE LVOT Vmax:   137.00 cm/s LVOT Vmean:  99.000  cm/s LVOT VTI:    0.261 m  AORTA Ao Root diam: 3.40 cm MITRAL VALVE                         TRICUSPID VALVE MV Area (PHT): 4.63 cm              TR Peak grad:   37.7 mmHg MV PHT:        47.56 msec            TR Vmax:        307.00 cm/s MV Decel Time: 164 msec MV E velocity: 93.00 cm/s  103 cm/s  SHUNTS MV A velocity: 108.00 cm/s 70.3 cm/s Systemic VTI:  0.26 m MV E/A ratio:  0.86         1.5       Systemic Diam: 2.00 cm  Kristeen Miss MD Electronically signed by Kristeen Miss MD Signature Date/Time: 01/22/2019/12:23:53 PM    Final    [place] EKG SITE RITE  Result Date: 01/23/2019 If Site Rite image not attached, placement could not be confirmed due to current cardiac rhythm.   Cardiac Studies     Patient Profile     Julie Sanford a 38 y.o.femalewith a historyofIV drug abuse, seizures, bipolar disorder, and depression but no known cardiac historywho is being seen today for the evaluation of elevated troponinat the request of Dr. Toniann Fail.  Assessment & Plan  1. Chest pain with elevated troponin - relatiely mild flat trop in setting of multiple systemic medical issues. Highest documented hstrop 319.  - echo LVEF 55-60%, EKG withoutspecific ischemic changes.   - symptoms on my evaluation atypical. Sharp pain worst with coughing and position. She is young with limited CAD risk factors, really one one is smoking. Chest pain resolved today - she did have an isolated short run of NSVT yesterday  - given constellation of pain, elevated trop, run of NSVT we will elect to obtain a lexiscan tomorrow.  - with elevated bp's and episode of NSVT start lopressor 12.5mg  bid  2. AKI - Cr is improving   3. Elevated transaminases - per primary team, suspecting acute hepatitis  4. Lower extremity weakness - MRI signs of myositis  5. Pneumonia - abx per primary team  6. Pancytopenia - per primary team  For questions or updates, please contact [facility] Please consult [email or web address] for contact info under        Signed, Dina Rich, MD  01/24/2019, 8:08 AM

## 2019-03-10 ENCOUNTER — Other Ambulatory Visit: Payer: Self-pay

## 2019-03-10 ENCOUNTER — Emergency Department (HOSPITAL_COMMUNITY): Payer: Medicaid Other

## 2019-03-10 ENCOUNTER — Encounter (HOSPITAL_COMMUNITY): Payer: Self-pay | Admitting: Emergency Medicine

## 2019-03-10 ENCOUNTER — Inpatient Hospital Stay (HOSPITAL_COMMUNITY)
Admission: EM | Admit: 2019-03-10 | Discharge: 2019-03-12 | DRG: 556 | Disposition: A | Discharge status: Home / Self Care | Payer: Medicaid Other | Attending: Student in an Organized Health Care Education/Training Program | Admitting: Student in an Organized Health Care Education/Training Program

## 2019-03-10 ENCOUNTER — Inpatient Hospital Stay (HOSPITAL_COMMUNITY): Payer: Medicaid Other

## 2019-03-10 DIAGNOSIS — F419 Anxiety disorder, unspecified: Secondary | ICD-10-CM | POA: Diagnosis present

## 2019-03-10 DIAGNOSIS — J449 Chronic obstructive pulmonary disease, unspecified: Secondary | ICD-10-CM | POA: Diagnosis present

## 2019-03-10 DIAGNOSIS — F4312 Post-traumatic stress disorder, chronic: Secondary | ICD-10-CM | POA: Diagnosis present

## 2019-03-10 DIAGNOSIS — B179 Acute viral hepatitis, unspecified: Secondary | ICD-10-CM

## 2019-03-10 DIAGNOSIS — F1410 Cocaine abuse, uncomplicated: Secondary | ICD-10-CM | POA: Diagnosis present

## 2019-03-10 DIAGNOSIS — F1190 Opioid use, unspecified, uncomplicated: Secondary | ICD-10-CM | POA: Diagnosis not present

## 2019-03-10 DIAGNOSIS — B009 Herpesviral infection, unspecified: Secondary | ICD-10-CM | POA: Diagnosis present

## 2019-03-10 DIAGNOSIS — F1123 Opioid dependence with withdrawal: Secondary | ICD-10-CM | POA: Diagnosis present

## 2019-03-10 DIAGNOSIS — M546 Pain in thoracic spine: Secondary | ICD-10-CM | POA: Diagnosis present

## 2019-03-10 DIAGNOSIS — J9601 Acute respiratory failure with hypoxia: Secondary | ICD-10-CM | POA: Diagnosis present

## 2019-03-10 DIAGNOSIS — Z20822 Contact with and (suspected) exposure to covid-19: Secondary | ICD-10-CM | POA: Diagnosis present

## 2019-03-10 DIAGNOSIS — M6282 Rhabdomyolysis: Secondary | ICD-10-CM | POA: Diagnosis present

## 2019-03-10 DIAGNOSIS — Z880 Allergy status to penicillin: Secondary | ICD-10-CM

## 2019-03-10 DIAGNOSIS — R0902 Hypoxemia: Secondary | ICD-10-CM | POA: Diagnosis present

## 2019-03-10 DIAGNOSIS — Z885 Allergy status to narcotic agent status: Secondary | ICD-10-CM | POA: Diagnosis not present

## 2019-03-10 DIAGNOSIS — R634 Abnormal weight loss: Secondary | ICD-10-CM | POA: Diagnosis present

## 2019-03-10 DIAGNOSIS — R61 Generalized hyperhidrosis: Secondary | ICD-10-CM | POA: Diagnosis present

## 2019-03-10 DIAGNOSIS — R768 Other specified abnormal immunological findings in serum: Secondary | ICD-10-CM | POA: Diagnosis present

## 2019-03-10 DIAGNOSIS — Z79899 Other long term (current) drug therapy: Secondary | ICD-10-CM

## 2019-03-10 DIAGNOSIS — M545 Low back pain, unspecified: Secondary | ICD-10-CM

## 2019-03-10 DIAGNOSIS — D61818 Other pancytopenia: Secondary | ICD-10-CM

## 2019-03-10 DIAGNOSIS — Z9981 Dependence on supplemental oxygen: Secondary | ICD-10-CM

## 2019-03-10 DIAGNOSIS — Z833 Family history of diabetes mellitus: Secondary | ICD-10-CM

## 2019-03-10 DIAGNOSIS — B1920 Unspecified viral hepatitis C without hepatic coma: Secondary | ICD-10-CM | POA: Diagnosis present

## 2019-03-10 DIAGNOSIS — F319 Bipolar disorder, unspecified: Secondary | ICD-10-CM | POA: Diagnosis present

## 2019-03-10 DIAGNOSIS — F1193 Opioid use, unspecified with withdrawal: Secondary | ICD-10-CM | POA: Diagnosis not present

## 2019-03-10 DIAGNOSIS — Z888 Allergy status to other drugs, medicaments and biological substances status: Secondary | ICD-10-CM | POA: Diagnosis not present

## 2019-03-10 DIAGNOSIS — M609 Myositis, unspecified: Principal | ICD-10-CM

## 2019-03-10 DIAGNOSIS — F1590 Other stimulant use, unspecified, uncomplicated: Secondary | ICD-10-CM | POA: Diagnosis present

## 2019-03-10 DIAGNOSIS — D509 Iron deficiency anemia, unspecified: Secondary | ICD-10-CM

## 2019-03-10 DIAGNOSIS — L80 Vitiligo: Secondary | ICD-10-CM | POA: Diagnosis not present

## 2019-03-10 DIAGNOSIS — F17210 Nicotine dependence, cigarettes, uncomplicated: Secondary | ICD-10-CM | POA: Diagnosis present

## 2019-03-10 DIAGNOSIS — F1910 Other psychoactive substance abuse, uncomplicated: Secondary | ICD-10-CM

## 2019-03-10 DIAGNOSIS — R569 Unspecified convulsions: Secondary | ICD-10-CM | POA: Diagnosis present

## 2019-03-10 DIAGNOSIS — F1199 Opioid use, unspecified with unspecified opioid-induced disorder: Secondary | ICD-10-CM | POA: Diagnosis present

## 2019-03-10 LAB — COMPREHENSIVE METABOLIC PANEL
ALT: 451 U/L — ABNORMAL HIGH (ref 0–44)
ALT: 484 U/L — ABNORMAL HIGH (ref 0–44)
AST: 1041 U/L — ABNORMAL HIGH (ref 15–41)
AST: 958 U/L — ABNORMAL HIGH (ref 15–41)
Albumin: 3.7 g/dL (ref 3.5–5.0)
Albumin: 3.3 g/dL — ABNORMAL LOW (ref 3.5–5.0)
Alkaline Phosphatase: 327 U/L — ABNORMAL HIGH (ref 38–126)
Alkaline Phosphatase: 360 U/L — ABNORMAL HIGH (ref 38–126)
Anion gap: 9 (ref 5–15)
Anion gap: 11 (ref 5–15)
BUN: 8 mg/dL (ref 6–20)
BUN: 5 mg/dL — ABNORMAL LOW (ref 6–20)
CO2: 28 mmol/L (ref 22–32)
CO2: 25 mmol/L (ref 22–32)
Calcium: 8.5 mg/dL — ABNORMAL LOW (ref 8.9–10.3)
Calcium: 8.3 mg/dL — ABNORMAL LOW (ref 8.9–10.3)
Chloride: 98 mmol/L (ref 98–111)
Chloride: 101 mmol/L (ref 98–111)
Creatinine, Ser: 0.78 mg/dL (ref 0.44–1.00)
Creatinine, Ser: 0.64 mg/dL (ref 0.44–1.00)
GFR calc Af Amer: >60 mL/min (ref >60)
GFR calc Af Amer: >60 mL/min (ref >60)
GFR calc non Af Amer: >60 mL/min (ref >60)
GFR calc non Af Amer: >60 mL/min (ref >60)
Glucose, Bld: 111 mg/dL — ABNORMAL HIGH (ref 70–99)
Glucose, Bld: 103 mg/dL — ABNORMAL HIGH (ref 70–99)
Potassium: 4 mmol/L (ref 3.5–5.1)
Potassium: 3.9 mmol/L (ref 3.5–5.1)
Sodium: 135 mmol/L (ref 135–145)
Sodium: 137 mmol/L (ref 135–145)
Total Bilirubin: 0.6 mg/dL (ref 0.3–1.2)
Total Bilirubin: 0.8 mg/dL (ref 0.3–1.2)
Total Protein: 7.1 g/dL (ref 6.5–8.1)
Total Protein: 6.6 g/dL (ref 6.5–8.1)

## 2019-03-10 LAB — PROTIME-INR
INR: 1 (ref 0.8–1.2)
Prothrombin Time: 13.2 seconds (ref 11.4–15.2)

## 2019-03-10 LAB — C-REACTIVE PROTEIN: CRP: 0.9 mg/dL (ref <1.0)

## 2019-03-10 LAB — CBC WITH DIFFERENTIAL/PLATELET
Abs Immature Granulocytes: 0.01 10*3/uL (ref 0.00–0.07)
Basophils Absolute: 0 10*3/uL (ref 0.0–0.1)
Basophils Relative: 1 %
Eosinophils Absolute: 0 10*3/uL (ref 0.0–0.5)
Eosinophils Relative: 1 %
HCT: 36.7 % (ref 36.0–46.0)
Hemoglobin: 10.8 g/dL — ABNORMAL LOW (ref 12.0–15.0)
Immature Granulocytes: 1 %
Lymphocytes Relative: 49 %
Lymphs Abs: 0.9 10*3/uL (ref 0.7–4.0)
MCH: 22.8 pg — ABNORMAL LOW (ref 26.0–34.0)
MCHC: 29.4 g/dL — ABNORMAL LOW (ref 30.0–36.0)
MCV: 77.6 fL — ABNORMAL LOW (ref 80.0–100.0)
Monocytes Absolute: 0.2 10*3/uL (ref 0.1–1.0)
Monocytes Relative: 12 %
Neutro Abs: 0.6 10*3/uL — ABNORMAL LOW (ref 1.7–7.7)
Neutrophils Relative %: 36 %
Platelets: 129 10*3/uL — ABNORMAL LOW (ref 150–400)
RBC: 4.73 MIL/uL (ref 3.87–5.11)
RDW: 18.9 % — ABNORMAL HIGH (ref 11.5–15.5)
WBC: 1.7 10*3/uL — ABNORMAL LOW (ref 4.0–10.5)
nRBC: 0 % (ref 0.0–0.2)

## 2019-03-10 LAB — RESPIRATORY PANEL BY RT PCR (FLU A&B, COVID)
Influenza A by PCR: NEGATIVE
Influenza B by PCR: NEGATIVE
SARS Coronavirus 2 by RT PCR: NEGATIVE

## 2019-03-10 LAB — TROPONIN I (HIGH SENSITIVITY): Troponin I (High Sensitivity): 6 ng/L (ref <18)

## 2019-03-10 LAB — URINALYSIS, ROUTINE W REFLEX MICROSCOPIC
Bilirubin Urine: NEGATIVE
Glucose, UA: NEGATIVE mg/dL
Hgb urine dipstick: NEGATIVE
Ketones, ur: NEGATIVE mg/dL
Leukocytes,Ua: NEGATIVE
Nitrite: NEGATIVE
Protein, ur: NEGATIVE mg/dL
Specific Gravity, Urine: 1.015 (ref 1.005–1.030)
pH: 6 (ref 5.0–8.0)

## 2019-03-10 LAB — LACTIC ACID, PLASMA
Lactic Acid, Venous: 0.7 mmol/L (ref 0.5–1.9)
Lactic Acid, Venous: 0.9 mmol/L (ref 0.5–1.9)

## 2019-03-10 LAB — CK: Total CK: 272 U/L — ABNORMAL HIGH (ref 38–234)

## 2019-03-10 LAB — I-STAT BETA HCG BLOOD, ED (MC, WL, AP ONLY): I-stat hCG, quantitative: <5 m[IU]/mL (ref <5)

## 2019-03-10 LAB — ACETAMINOPHEN LEVEL: Acetaminophen (Tylenol), Serum: <10 ug/mL — ABNORMAL LOW (ref 10–30)

## 2019-03-10 LAB — SEDIMENTATION RATE: Sed Rate: 11 mm/hr (ref 0–22)

## 2019-03-10 MED ORDER — HYDROMORPHONE HCL 1 MG/ML IJ SOLN
2.0000 mg | INTRAMUSCULAR | Status: DC | PRN
  Administered 2019-03-10 – 2019-03-12 (×7): 2 mg via INTRAVENOUS
  Filled 2019-03-10 (×7): qty 2

## 2019-03-10 MED ORDER — BUPRENORPHINE HCL 0.3 MG/ML IJ SOLN
0.3000 mg | Freq: Once | INTRAMUSCULAR | Status: AC
  Administered 2019-03-10: 06:00:00 0.3 mg via INTRAVENOUS
  Filled 2019-03-10: qty 1

## 2019-03-10 MED ORDER — ENSURE ENLIVE PO LIQD: 237.0000 mL | Freq: Two times a day (BID) | ORAL | Status: DC

## 2019-03-10 MED ORDER — ALBUTEROL SULFATE (2.5 MG/3ML) 0.083% IN NEBU: 3.0000 mL | INHALATION_SOLUTION | RESPIRATORY_TRACT | Status: DC | PRN

## 2019-03-10 MED ORDER — BUPRENORPHINE HCL-NALOXONE HCL 2-0.5 MG SL SUBL
2.0000 | SUBLINGUAL_TABLET | Freq: Once | SUBLINGUAL | Status: AC
  Administered 2019-03-10: 2 via SUBLINGUAL
  Filled 2019-03-10: qty 2

## 2019-03-10 MED ORDER — LOPERAMIDE HCL 2 MG PO CAPS
2.0000 mg | ORAL_CAPSULE | ORAL | Status: DC | PRN
  Administered 2019-03-10 – 2019-03-12 (×3): 2 mg via ORAL
  Filled 2019-03-10 (×3): qty 1

## 2019-03-10 MED ORDER — SODIUM CHLORIDE 0.9% FLUSH: 3.0000 mL | Freq: Once | INTRAVENOUS | Status: DC

## 2019-03-10 MED ORDER — IOHEXOL 350 MG/ML SOLN
75.0000 mL | Freq: Once | INTRAVENOUS | Status: AC | PRN
  Administered 2019-03-10: 75 mL via INTRAVENOUS

## 2019-03-10 MED ORDER — SODIUM CHLORIDE 0.9 % IV SOLN
2.0000 g | Freq: Three times a day (TID) | INTRAVENOUS | Status: DC
  Administered 2019-03-10 – 2019-03-11 (×3): 2 g via INTRAVENOUS
  Filled 2019-03-10 (×5): qty 2

## 2019-03-10 MED ORDER — SODIUM CHLORIDE 0.9 % IV BOLUS
1000.0000 mL | Freq: Once | INTRAVENOUS | Status: AC
  Administered 2019-03-10: 06:00:00 1000 mL via INTRAVENOUS

## 2019-03-10 MED ORDER — VANCOMYCIN HCL 1250 MG/250ML IV SOLN
1250.0000 mg | INTRAVENOUS | Status: DC
  Administered 2019-03-10 – 2019-03-11 (×2): 1250 mg via INTRAVENOUS
  Filled 2019-03-10 (×3): qty 250

## 2019-03-10 MED ORDER — ENOXAPARIN SODIUM 40 MG/0.4ML ~~LOC~~ SOLN
40.0000 mg | SUBCUTANEOUS | Status: DC
  Administered 2019-03-10 – 2019-03-11 (×2): 40 mg via SUBCUTANEOUS
  Filled 2019-03-10 (×2): qty 0.4

## 2019-03-10 NOTE — ED Provider Notes (Signed)
Accepted handoff at shift change from [facility]. Please see prior provider note for more detail.   Briefly: Patient is 39 y.o. female with a history of frequent heroin use and polysubstance abuse, bipolar, MDD, PTSD.   Patient was last hospitalized 01/21/19 -01/24/2019 At that time she had acute renal failure that was attributed to dehydration/rhabdomyolysis.  CT of abdomen was unremarkable.  Her elevated transaminase was treated to accommodation of hepatitis C and rhabdomyolysis.  Patient was started on levothyroxine on discharge.  Also recommended to follow-up with OT/PT/rheumatology for myositis.  DDX: concern for PE/spinal epidural abscess vs myositis. Is hypoxic and with back pain.  May require additional spine imaging/MRI.  Plan: Pending workup. Admit to hospitalists for hypoxia and may need FU by GI for untreated Hep C.    HPI Last heroin use yesterday  Hx: transaminase and myositis -- tx with abx but left ama. No cultures at that time... still none. Worsening back pain now. Was hypoxic in triage. Unable to wean initially. Now intermittently hypoxic.  Has alk phos elevated > prior.   Was seen by GI last visit (shock liver vs hep C)  Plan: Admit for hypoxia and myositis likely worsening may require repeat MR.      Physical Exam  BP 119/80   Pulse 83   Temp (!) 97.5 F (36.4 C) (Oral)   Resp 16   SpO2 93%   CONSTITUTIONAL:   Chronically ill-appearing, thin NEURO:  Alert and oriented x 3, no focal deficits, sensation intact all 4 extremities and face.  Able to move all 4 extremities. EYES:  pupils equal and reactive ENT/NECK:  trachea midline, no JVD CARDIO:  reg rate, reg rhythm, well-perfused PULM:  None labored breathing GI/GU:  Abdomen non-distended, related old surgical scar, soft nontender MSK/SPINE:  No gross deformities, no edema, tenderness to palpation of lumbar spine.  No tenderness to palpation of neck. SKIN:  no rash obvious, atraumatic, no ecchymosis,  numerous needle insertion sites with no signs of infection or streaking. PSYCH:  Appropriate speech and behavior  ED Course/Procedures      Procedures   Results for orders placed or performed during the hospital encounter of 03/10/19  Blood culture (routine x 2)   Specimen: BLOOD LEFT FOREARM  Result Value Ref Range   Specimen Description BLOOD LEFT FOREARM    Special Requests      BOTTLES DRAWN AEROBIC AND ANAEROBIC Blood Culture adequate volume Performed at [facility] [address] [place], [place] [address]    Culture PENDING    Report Status PENDING   Respiratory Panel by RT PCR (Flu A&B, Covid) - Nasopharyngeal Swab   Specimen: Nasopharyngeal Swab  Result Value Ref Range   SARS Coronavirus 2 by RT PCR NEGATIVE NEGATIVE   Influenza A by PCR NEGATIVE NEGATIVE   Influenza B by PCR NEGATIVE NEGATIVE  Lactic acid, plasma  Result Value Ref Range   Lactic Acid, Venous 0.7 0.5 - 1.9 mmol/L  Lactic acid, plasma  Result Value Ref Range   Lactic Acid, Venous 0.9 0.5 - 1.9 mmol/L  Comprehensive metabolic panel  Result Value Ref Range   Sodium 135 135 - 145 mmol/L   Potassium 4.0 3.5 - 5.1 mmol/L   Chloride 98 98 - 111 mmol/L   CO2 28 22 - 32 mmol/L   Glucose, Bld 111 (H) 70 - 99 mg/dL   BUN 8 6 - 20 mg/dL   Creatinine, Ser 0.78 0.44 - 1.00 mg/dL  Calcium 8.5 (L) 8.9 - 10.3 mg/dL   Total Protein 7.1 6.5 - 8.1 g/dL   Albumin 3.7 3.5 - 5.0 g/dL   AST 1,041 (H) 15 - 41 U/L   ALT 451 (H) 0 - 44 U/L   Alkaline Phosphatase 327 (H) 38 - 126 U/L   Total Bilirubin 0.6 0.3 - 1.2 mg/dL   GFR calc non Af Amer >60 >60 mL/min   GFR calc Af Amer >60 >60 mL/min   Anion gap 9 5 - 15  CBC with Differential  Result Value Ref Range   WBC 1.7 (L) 4.0 - 10.5 K/uL   RBC 4.73 3.87 - 5.11 MIL/uL   Hemoglobin 10.8 (L) 12.0 - 15.0 g/dL   HCT 36.7 36.0 - 46.0 %   MCV 77.6 (L) 80.0 - 100.0 fL   MCH 22.8 (L) 26.0 - 34.0 pg   MCHC 29.4 (L) 30.0 - 36.0 g/dL   RDW 18.9 (H) 11.5 - 15.5  %   Platelets 129 (L) 150 - 400 K/uL   nRBC 0.0 0.0 - 0.2 %   Neutrophils Relative % 36 %   Neutro Abs 0.6 (L) 1.7 - 7.7 K/uL   Lymphocytes Relative 49 %   Lymphs Abs 0.9 0.7 - 4.0 K/uL   Monocytes Relative 12 %   Monocytes Absolute 0.2 0.1 - 1.0 K/uL   Eosinophils Relative 1 %   Eosinophils Absolute 0.0 0.0 - 0.5 K/uL   Basophils Relative 1 %   Basophils Absolute 0.0 0.0 - 0.1 K/uL   Immature Granulocytes 1 %   Abs Immature Granulocytes 0.01 0.00 - 0.07 K/uL  Urinalysis, Routine w reflex microscopic  Result Value Ref Range   Color, Urine AMBER (A) YELLOW   APPearance CLEAR CLEAR   Specific Gravity, Urine 1.015 1.005 - 1.030   pH 6.0 5.0 - 8.0   Glucose, UA NEGATIVE NEGATIVE mg/dL   Hgb urine dipstick NEGATIVE NEGATIVE   Bilirubin Urine NEGATIVE NEGATIVE   Ketones, ur NEGATIVE NEGATIVE mg/dL   Protein, ur NEGATIVE NEGATIVE mg/dL   Nitrite NEGATIVE NEGATIVE   Leukocytes,Ua NEGATIVE NEGATIVE  CK  Result Value Ref Range   Total CK 272 (H) 38 - 234 U/L  C-reactive protein  Result Value Ref Range   CRP 0.9 <1.0 mg/dL  Sedimentation rate  Result Value Ref Range   Sed Rate 11 0 - 22 mm/hr  Acetaminophen level  Result Value Ref Range   Acetaminophen (Tylenol), Serum <10 (L) 10 - 30 ug/mL  I-Stat beta hCG blood, ED  Result Value Ref Range   I-stat hCG, quantitative <5.0 <5 mIU/mL   Comment 3          Troponin I (High Sensitivity)  Result Value Ref Range   Troponin I (High Sensitivity) 6 <18 ng/L   DG Chest 2 View  Result Date: 03/10/2019 CLINICAL DATA:  Low back pain EXAM: CHEST - 2 VIEW COMPARISON:  01/22/2019 FINDINGS: The heart size and mediastinal contours are within normal limits. Both lungs are clear. The visualized skeletal structures are unremarkable. IMPRESSION: Normal study. Electronically Signed   By: Rolm Baptise M.D.   On: 03/10/2019 02:01   CT Angio Chest PE W/Cm &/Or Wo Cm  Result Date: 03/10/2019 CLINICAL DATA:  Shortness of breath and hypoxia. EXAM: CT  ANGIOGRAPHY CHEST WITH CONTRAST TECHNIQUE: Multidetector CT imaging of the chest was performed using the standard protocol during bolus administration of intravenous contrast. Multiplanar CT image reconstructions and MIPs were obtained to evaluate the vascular  anatomy. CONTRAST:  57m OMNIPAQUE IOHEXOL 350 MG/ML SOLN COMPARISON:  07/11/2017 FINDINGS: Cardiovascular: The heart size is normal. No substantial pericardial effusion. No thoracic aortic aneurysm. No filling defect in the opacified pulmonary arteries to suggest the presence of an acute pulmonary embolus. Mediastinum/Nodes: No mediastinal lymphadenopathy. There is no hilar lymphadenopathy. The esophagus has normal imaging features. There is no axillary lymphadenopathy. Lungs/Pleura: Centrilobular emphsyema noted. Ill-defined nodular airspace disease is identified in the posterior right upper lobe with nodules measuring up to about 6 mm. 1.0 x 0.6 cm nodule is identified in the right lung apex. Left lung clear. No pneumothorax or pleural effusion. Upper Abdomen: Unremarkable. Musculoskeletal: No worrisome lytic or sclerotic osseous abnormality. Review of the MIP images confirms the above findings. IMPRESSION: 1. No CT evidence for acute pulmonary embolus. 2. Clustered ill-defined nodularity in the posterior right upper lobe likely reflects atypical infection (including MAI). 3. 1.0 x 0.6 cm nodule in the right apex is stable since 07/11/2017 although it is new since 11/03/2016. Nearly 2 years of stable imaging follow-up is reassuring for a benign etiology. Consider 1 additional follow-up CT in 6-12 months to document 2 years of imaging stability. Electronically Signed   By: EMisty StanleyM.D.   On: 03/10/2019 10:10    MDM   Respiratory panel negative for Covid and flu.  Troponin X1 within normal limits.  CRP and ESR within normal limits.  CK elevated at 272 improved from 1 month ago when it was 2000.  Patient appears to have chronic leukocytosis which is  mildly lower today than usual at 1.7.  Mild anemia at 10.8 which is improved from her last hospitalization with hemoglobin of approximately 8.  Significant transaminitis with AST of over 1000.  This is likely due to untreated hepatitis C identified during past hospitalization.  She was recommended to follow-up with GI who she did not see.  Patient ambulated with pulse ox approximately 94% on room air.  However on discussion with nursing staff patient continues to drop below 90% on room air in the room.  Has been observed at 88% several times.  CT scan negative for PE--With history of clustered ill-defined nodularity in the posterior right upper lobe.  Read as possible atypical infection by radiology.   12:00 PM Discussed case with Dr. BKoleen Distanceof internal medicine teaching service.  She will see patient and admit to hospital.     FTedd Sias PUtah01/27/21 1213    PDavonna Belling MD 03/10/19 1555

## 2019-03-10 NOTE — ED Notes (Signed)
Due to pt low O2 stats pt placed on 3lpm O2 by N/C. O2 stats came up to 96%

## 2019-03-10 NOTE — ED Provider Notes (Signed)
Attestation: Medical screening examination/treatment/procedure(s) were conducted as a shared visit with non-physician practitioner(s) and myself.  I personally evaluated the patient during the encounter.  Briefly, the patient is a 39 y.o. female with h/o IVDU, here for back pain. Known to have myositis, but left AMA recently.   Vitals:   03/10/19 0134  BP: 103/71  Pulse: 94  Resp: 18  Temp: (!) 97.4 F (36.3 C)  SpO2: (!) 85%    CONSTITUTIONAL:  Chronically ill-appearing, NAD NEURO:  Alert and oriented x 3, no focal deficits.  EYES:  pupils equal and reactive ENT/NECK:  trachea midline, no JVD CARDIO:  reg rate, reg rhythm, well-perfused PULM:  None labored breathing GI/GU:  Abdomin non-distended MSK/SPINE:  Lumbar TTP SKIN:  no rash, atraumatic PSYCH:  Appropriate speech and behavior    EKG Interpretation  Date/Time:    Ventricular Rate:    PR Interval:    QRS Duration:   QT Interval:    QTC Calculation:   R Axis:     Text Interpretation:         Found to be hypoxic on RA. Lungs clear. Plan for CT PE. Plan to admit for continued management of known myositis, pending CT PE. Signed out to oncoming team.   .Critical Care Performed by: Nira Conn, MD Authorized by: Nira Conn, MD      CRITICAL CARE Performed by: Amadeo Garnet Henritta Mutz Total critical care time: 30 minutes Critical care time was exclusive of separately billable procedures and treating other patients. Critical care was necessary to treat or prevent imminent or life-threatening deterioration. Critical care was time spent personally by me on the following activities: development of treatment plan with patient and/or surrogate as well as nursing, discussions with consultants, evaluation of patient's response to treatment, examination of patient, obtaining history from patient or surrogate, ordering and performing treatments and interventions, ordering and review of laboratory  studies, ordering and review of radiographic studies, pulse oximetry and re-evaluation of patient's condition.              Nira Conn, MD 03/19/19 [phone]

## 2019-03-10 NOTE — ED Notes (Signed)
PIVC placed on the left forearm with a 20G which had positive blood return and flushed without pain or infiltration. Blood collected, labeled, and sent to lab. Medications given and charted per [facility]. Tolerated well. IVF infusing per [facility] with no signs of infiltration.

## 2019-03-10 NOTE — H&P (Signed)
Date: 03/10/2019               Patient Name:  Julie Sanford MRN: [id number]  DOB: [birth year] Age / Sex: 39 y.o., female   PCP: Patient, No Pcp Per         Medical Service: Internal Medicine Teaching Service         Attending Physician: Dr. Evette Doffing, Mallie Mussel, *    First Contact: Dr. Charleen Kirks Pager: [phone]  Second Contact: Dr. Koleen Distance Pager: [phone]       After Hours (After 5p/  First Contact Pager: [phone]  weekends / holidays): Second Contact Pager: [phone]   Chief Complaint: Back pain   History of Present Illness:   Julie Sanford is a 39 y/o female with a PMHx of acute hepatitis, hepatitis C, rhabdomyolysis, using IV drugs, IDA, pancytopenia, thoracic and lumbar myositis, who presents to the ED today for back pain.   Julie Sanford states that she has been having back pain chronically since episode of myositis back in December 2020, however last night, it suddenly became worse when she was at rest talking to her children.  The pain is located in her middle lumbar region without radiation.  Overnight, she also developed nausea and vomiting that has continued into today.  She she endorses generalized weakness lately, however denies any leg pain or individual leg weakness, leg numbness, perirectal numbness, bowel or urinary incontinence.  Julie Sanford also states that she has shortness of breath at baseline due to history of asthma and COPD.  She denies any worsened shortness of breath recently.  She denies any sick contacts within her household.  She denies any fever, chills, sore throat, runny nose, cough, chest pain at this time.  She does endorse recent weight loss in the last few weeks due to decreased appetite in addition to night sweats.  Patient denies any abdominal pain at this time.  She is aware that her hep C antibodies are positive, however she has not had any further work-up for this.  She notes her last use of IV drugs was approximately 2 days ago, which time she  used heroin.  She denies any redness, swelling, tenderness at site of injection.  ED Course:  Per ED provider, patient was found to be hypoxic, saturating at 85% on room air.  She was placed on 6 L and saturations improved to the 90s and she was able to be weaned to 2 L nasal cannula.  CBC remarkable for pancytopenia, including a microcytic anemia.  CMP remarkable for acutely elevated LFT, including AST of 1041, ALT of 451, alk phos of 327.  CK of 272.  No evidence of lactic acidosis.  CRP, sed rate and acetaminophen levels are within normal limits.  CTA obtained to rule out PE and was negative for acute PE but did show clustered nodularities in the right upper lobe and central lobar emphysema.  Meds:  No current facility-administered medications on file prior to encounter.   Current Outpatient Medications on File Prior to Encounter  Medication Sig Dispense Refill  . albuterol (PROVENTIL HFA;VENTOLIN HFA) 108 (90 Base) MCG/ACT inhaler Inhale 2 puffs into the lungs every 4 (four) hours as needed for wheezing or shortness of breath. (Patient not taking: Reported on 03/10/2019)    . gabapentin (NEURONTIN) 300 MG capsule Take 1 capsule (300 mg total) by mouth 3 (three) times daily. Agitation (Patient not taking: Reported on 03/10/2019) 90 capsule 0  . hydrOXYzine (ATARAX/VISTARIL) 50 MG  tablet Take 2 tablets (100 mg total) by mouth 3 (three) times daily as needed for itching or anxiety. (Patient not taking: Reported on 03/10/2019) 60 tablet 0  . levETIRAcetam (KEPPRA) 500 MG tablet Take 1 tablet (500 mg total) by mouth 2 (two) times daily. For seizures (Patient not taking: Reported on 03/10/2019)    . mirtazapine (REMERON SOL-TAB) 45 MG disintegrating tablet Take 1 tablet (45 mg total) by mouth at bedtime. For depression (Patient not taking: Reported on 03/10/2019) 30 tablet 0   Allergies: Allergies as of 03/10/2019 - Review Complete 03/10/2019  Allergen Reaction Noted  . Quetiapine Other (See Comments)  03/17/2016  . Tramadol Other (See Comments) 04/22/2011  . Penicillins Hives 08/24/2016   Past Medical History:  Diagnosis Date  . Bipolar 1 disorder ([place])   . Depression   . Herpes   . Seizures ([place])    Family History:  Family History  Problem Relation Age of Onset  . Diabetes Sister    Social History:  Alcohol Use: denies Tobacco: 2 ppd x 22 years Drug: IV heroin use, with last use 2 days ago. Injection sites vary. Endorses meth use. Denies cocaine/crack, marijuana   Lives at home with her mother and 4 children.   Review of Systems: A complete ROS was negative except as per HPI.   Physical Exam: Blood pressure (!) 124/92, pulse 100, temperature 97.9 F (36.6 C), resp. rate 16, height '4\' 11"'  (1.499 m), weight 62.6 kg, SpO2 97 %.  Physical Exam Vitals and nursing note reviewed.  Constitutional:      General: She is not in acute distress.    Appearance: She is normal weight. She is not ill-appearing, toxic-appearing or diaphoretic.  Cardiovascular:     Rate and Rhythm: Normal rate and regular rhythm.     Heart sounds: No murmur.  Pulmonary:     Effort: Pulmonary effort is normal. No respiratory distress.     Breath sounds: Normal breath sounds. No wheezing, rhonchi or rales.  Abdominal:     General: Bowel sounds are normal. There is no distension.     Palpations: Abdomen is soft.     Tenderness: There is no abdominal tenderness. There is no guarding.  Musculoskeletal:        General: No deformity or signs of injury.     Thoracic back: No swelling, tenderness or bony tenderness.     Lumbar back: Tenderness present. No swelling, edema, deformity, lacerations or bony tenderness.     Right lower leg: No edema.     Left lower leg: No edema.  Skin:    General: Skin is warm and dry.     Coloration: Skin is not jaundiced or pale.     Findings: No bruising.  Neurological:     General: No focal deficit present.     Mental Status: She is alert and oriented to person,  place, and time. Mental status is at baseline.     Sensory: No sensory deficit.     Motor: No weakness.  Psychiatric:        Mood and Affect: Mood normal.        Behavior: Behavior normal.    CXR: personally reviewed my interpretation is: No opacities or consolidations. No pleural effusions.   Assessment & Plan by Problem: Principal Problem:   Myositis Active Problems:   Opioid use disorder (HCC)   # Lumbar Back Pain  # History of thoracic and lumbar myositis  Given history of IV drug use, patient  is at risk of abscess. On the other hand, she has a recent history of myositis that may be unresolved. MRI was obtained today that showed edema in the lower lumbar region consistent with myositis. Unfortunately, significant motion degradation and early termination made it difficult to assess for abscess. CK is mildly elevated at 272. She's remained afebrile and neurological examination is negative. CRP and sed rate are WNL, which is reassuring regarding both infection and rheumatologic causes. Patient is leukopenic chronically, so no elevated WBC to support infection. Blood cultures are NGTD at 12 hours currently.   Given history of leukopenia and recent IV drug use, will treat empirically until blood cultures are negative x 48 hours.   - Pain control with Buprenorphine  - Vancomycin per pharmacy  - Cefepime per pharmacy - Blood cultures pending   # Hypoxia  Initially hypoxic to the low 80s on RA that resolved with 6L , then weaned to 2L. On our examination, she was saturating well back on RA. No recent signs or symptoms to suspect acute URI. CTA was obtained to rule out PE, which was negative. However, it showed clustered nodularities in the RUL that may be atypical infection. Patient is being treated with broad spectrum antibiotics at this time for back pain. Will monitor for fever and can add azithromycin for additional CAP coverage that time if indicated.   - Continuous pulse ox   #  Acute Hepatitis  # Hepatitis C  Recent history of elevated LFTs back in December of 2020, at which time patient was evaluated by GI. Differential at that time included acute presentation of Hepatitis C, which patient tested positive for antibodies VS shock liver. Patient was positive for antibodies since 2018 but full work up not completed. No encephalopathy at this time. Will check PT/INR.    - Hep C RNA quant, genotyping  - Trend LFTs - PT/INR  # Pancytopenia  # Iron Deficiency Anemia Anemia present the longest, since at least 2013. Leukopenia has been present since at least 2018. Thrombocytopenia is new since December 2020. Anemia was worked up and iron deficiency was playing a role with saturation of 3%. Ferritin was 21 in December but previously 5 a year ago.  Previous ret count showed evidence of hyperproliferation. Uncertain etiology at this point. Will continue to monitor and recommend outpatient follow up.   - CBC tomorrow AM - Reticulocyte count   Diet: Regular  DVT ppx: Lovenox  Code: Full   Dispo: Admit patient to Inpatient with expected length of stay greater than 2 midnights.  Signed: Dr. Jose Persia Internal Medicine PGY-1  Pager: [phone] 03/10/2019, 6:28 PM

## 2019-03-10 NOTE — ED Notes (Signed)
Pt. Ambulated with steady gait. o2 stayed 94 and above

## 2019-03-10 NOTE — ED Provider Notes (Cosign Needed)
[facility] EMERGENCY DEPARTMENT Provider Note   CSN: [id number] Arrival date & time: 03/10/19  0117     History Chief Complaint  Patient presents with  . Back Pain    Julie Sanford is a 39 y.o. female with a history of IV heroin use, IV cocaine use, rhabdomyolysis, hepatitis C, and thoracic and lumbar myositis who presents emergency department with a chief complaint of back pain.  The patient was was recently admitted from 12/10-12/13 for acute renal failure that was found to be secondary to dehydration and mild rhabdomyolysis, elevated AST and ALT that was thought to be secondary to hepatitis C and rhabdomyolysis, and myositis of the thoracic and lumbar spine at that was thought to either be secondary to substance abuse versus hypothyroidism.  Blood cultures were pending at that time and the patient was treated on broad-spectrum antibiotics prior to leaving AMA.  She reports that back pain persisted after leaving the hospital, but significantly worsened over the last few days.  Pain is most severe in her bilateral low back.  She characterizes the pain as sharp and stabbing.  She does report that she had 2 episodes of soiling herself last week, but reports that she has been able to control her bowels since his episodes.  No urinary incontinence.  She does note that her urine has been very dark over the last few days.  She denies numbness or weakness.  She also reports that she has been feeling very short of breath over the last few weeks.  No chest pain or cough.  No leg swelling.  No fever or chills.  No muscle cramps.  She was found to be hypoxic at 85% on room air at triage.  Hypoxia improved when placed on 2 L nasal cannula.  She denies any IV drug use for at least 24 hours when she used heroin.  Reports that she was not discharged home with any medications as she left AMA.  She has not been seen or evaluated by any healthcare professionals since her most recent  admission.  The history is provided by the patient. No language interpreter was used.       Past Medical History:  Diagnosis Date  . Bipolar 1 disorder (HCC)   . Depression   . Herpes   . Seizures [facility])     Patient Active Problem List   Diagnosis Date Noted  . Chest pain of uncertain etiology   . Elevated troponin   . SOB (shortness of breath)   . Acute renal failure (ARF) (HCC) 01/21/2019  . Acute hepatitis 01/21/2019  . ARF (acute renal failure) (HCC) 01/21/2019  . MDD (major depressive disorder), recurrent severe, without psychosis (HCC) 02/12/2018  . Substance induced mood disorder (HCC) 01/29/2018  . Pain   . Cough 11/03/2016  . Cavitary lesion of lung 11/03/2016  . Hypokalemia 11/03/2016  . Prepatellar bursitis, right knee 09/27/2016  . Bipolar 1 disorder (HCC) 09/18/2016  . Protein-calorie malnutrition, severe 09/05/2016  . Pressure injury of skin 09/04/2016  . PTSD (post-traumatic stress disorder) 04/25/2011    Class: Chronic  . Heroin dependence (HCC) 04/23/2011  . Anemia 04/23/2011  . Cocaine abuse (HCC) 04/23/2011    Past Surgical History:  Procedure Laterality Date  . CESAREAN SECTION       OB History   No obstetric history on file.     Family History  Problem Relation Age of Onset  . Diabetes Sister     Social History  Tobacco Use  . Smoking status: Current Every Day Smoker    Packs/day: 2.00    Types: Cigarettes  . Smokeless tobacco: Never Used  Substance Use Topics  . Alcohol use: No  . Drug use: Yes    Types: Heroin    Comment: was clean for 6 mths, using x 1 week daily    Home Medications Prior to Admission medications   Medication Sig Start Date End Date Taking? Authorizing Provider  albuterol (PROVENTIL HFA;VENTOLIN HFA) 108 (90 Base) MCG/ACT inhaler Inhale 2 puffs into the lungs every 4 (four) hours as needed for wheezing or shortness of breath. Patient not taking: Reported on 03/10/2019 02/18/18   Armandina Stammer I, NP    gabapentin (NEURONTIN) 300 MG capsule Take 1 capsule (300 mg total) by mouth 3 (three) times daily. Agitation Patient not taking: Reported on 03/10/2019 02/18/18   Armandina Stammer I, NP  hydrOXYzine (ATARAX/VISTARIL) 50 MG tablet Take 2 tablets (100 mg total) by mouth 3 (three) times daily as needed for itching or anxiety. Patient not taking: Reported on 03/10/2019 02/18/18   Armandina Stammer I, NP  levETIRAcetam (KEPPRA) 500 MG tablet Take 1 tablet (500 mg total) by mouth 2 (two) times daily. For seizures Patient not taking: Reported on 03/10/2019 02/18/18   Armandina Stammer I, NP  mirtazapine (REMERON SOL-TAB) 45 MG disintegrating tablet Take 1 tablet (45 mg total) by mouth at bedtime. For depression Patient not taking: Reported on 03/10/2019 02/18/18   Armandina Stammer I, NP    Allergies    Quetiapine, Tramadol, and Penicillins  Review of Systems   Review of Systems  Constitutional: Negative for activity change, chills and fever.  HENT: Negative for congestion, sneezing, sore throat and voice change.   Eyes: Negative for visual disturbance.  Respiratory: Positive for shortness of breath. Negative for cough and wheezing.   Cardiovascular: Negative for chest pain, palpitations and leg swelling.  Gastrointestinal: Negative for abdominal pain, blood in stool, constipation, diarrhea, nausea and vomiting.  Genitourinary: Negative for dysuria, flank pain, genital sores, hematuria, pelvic pain, urgency and vaginal pain.  Musculoskeletal: Positive for arthralgias, back pain and myalgias. Negative for gait problem, joint swelling, neck pain and neck stiffness.  Skin: Negative for rash.  Allergic/Immunologic: Negative for immunocompromised state.  Neurological: Negative for dizziness, seizures, syncope, weakness, numbness and headaches.  Psychiatric/Behavioral: Negative for confusion.    Physical Exam Updated Vital Signs BP 119/80   Pulse 91   Temp (!) 97.5 F (36.4 C) (Oral)   Resp 16   SpO2 94%   Physical  Exam Vitals and nursing note reviewed.  Constitutional:      General: She is not in acute distress.    Comments: Appears much older than stated age. Nasal cannula in place.  HENT:     Head: Normocephalic.  Eyes:     General: No scleral icterus.    Extraocular Movements: Extraocular movements intact.     Conjunctiva/sclera: Conjunctivae normal.     Pupils: Pupils are equal, round, and reactive to light.  Cardiovascular:     Rate and Rhythm: Normal rate and regular rhythm.     Heart sounds: No murmur. No friction rub. No gallop.   Pulmonary:     Effort: Pulmonary effort is normal. No respiratory distress.     Comments: Lungs are clear to auscultation bilaterally.  No retractions or accessory muscle use. Abdominal:     General: There is no distension.     Palpations: Abdomen is soft. There is no  mass.     Tenderness: There is no abdominal tenderness. There is no right CVA tenderness, left CVA tenderness, guarding or rebound.     Hernia: No hernia is present.  Musculoskeletal:        General: No tenderness.     Cervical back: Normal range of motion and neck supple.     Lumbar back: No swelling or edema.       Back:     Right lower leg: No edema.     Left lower leg: No edema.     Comments: Tender to palpation to the spinous processes of the inferior lumbar spine and bilateral paraspinal muscles.  No overlying redness or edema.  No focal tenderness to the thoracic or cervical spinous processes or bilateral paraspinal muscles.  4 out of 5 strength against resistance with dorsiflexion plantarflexion.  Moves all 4 extremities spontaneously.  DP and PT pulses are 2+ and symmetric.  Sensation intact and equal throughout.  Good capillary refill of the digits of the bilateral feet.  Skin:    General: Skin is warm.     Findings: No rash.  Neurological:     Mental Status: She is alert and oriented to person, place, and time.  Psychiatric:        Behavior: Behavior normal.     ED Results  / Procedures / Treatments   Labs (all labs ordered are listed, but only abnormal results are displayed) Labs Reviewed  COMPREHENSIVE METABOLIC PANEL - Abnormal; Notable for the following components:      Result Value   Glucose, Bld 111 (*)    Calcium 8.5 (*)    AST 1,041 (*)    ALT 451 (*)    Alkaline Phosphatase 327 (*)    All other components within normal limits  CBC WITH DIFFERENTIAL/PLATELET - Abnormal; Notable for the following components:   WBC 1.7 (*)    Hemoglobin 10.8 (*)    MCV 77.6 (*)    MCH 22.8 (*)    MCHC 29.4 (*)    RDW 18.9 (*)    Platelets 129 (*)    Neutro Abs 0.6 (*)    All other components within normal limits  URINALYSIS, ROUTINE W REFLEX MICROSCOPIC - Abnormal; Notable for the following components:   Color, Urine AMBER (*)    All other components within normal limits  CK - Abnormal; Notable for the following components:   Total CK 272 (*)    All other components within normal limits  ACETAMINOPHEN LEVEL - Abnormal; Notable for the following components:   Acetaminophen (Tylenol), Serum <10 (*)    All other components within normal limits  CULTURE, BLOOD (ROUTINE X 2)  CULTURE, BLOOD (ROUTINE X 2)  RESPIRATORY PANEL BY RT PCR (FLU A&B, COVID)  LACTIC ACID, PLASMA  LACTIC ACID, PLASMA  C-REACTIVE PROTEIN  SEDIMENTATION RATE  I-STAT BETA HCG BLOOD, ED ([facility], [facility], [facility] ONLY)  TROPONIN I (HIGH SENSITIVITY)  TROPONIN I (HIGH SENSITIVITY)    EKG None  Radiology DG Chest 2 View  Result Date: 03/10/2019 CLINICAL DATA:  Low back pain EXAM: CHEST - 2 VIEW COMPARISON:  01/22/2019 FINDINGS: The heart size and mediastinal contours are within normal limits. Both lungs are clear. The visualized skeletal structures are unremarkable. IMPRESSION: Normal study. Electronically Signed   By: Rolm Baptise M.D.   On: 03/10/2019 02:01    Procedures Procedures (including critical care time)  Medications Ordered in ED Medications  sodium chloride flush (NS) 0.9 %  injection  3 mL (has no administration in time range)  sodium chloride 0.9 % bolus 1,000 mL (0 mLs Intravenous Stopped 03/10/19 0628)  buprenorphine (BUPRENEX) injection 0.3 mg (0.3 mg Intravenous Given 03/10/19 0604)    ED Course  I have reviewed the triage vital signs and the nursing notes.  Pertinent labs & imaging results that were available during my care of the patient were reviewed by me and considered in my medical decision making (see chart for details).    MDM Rules/Calculators/A&P                      39 year old female with a history of IV heroin use, IV cocaine use, rhabdomyolysis, hepatitis C, and thoracic and lumbar myositis presenting with back pain since she left AMA from the hospital over a month ago.  She also presents with worsening shortness of breath.  Afebrile.  No tachycardia.  She is normotensive.  In triage on room air, the patient was found to be satting at 85% and was placed on 2 L nasal cannula.  When in the room evaluating the patient, she maintained oxygen saturations in the mid 90s for approximately 10 minutes.  However, within another 10 minutes, she was found to be satting at 79% on room air.  She required 6 L nasal cannula for 1 to 2 minutes before her oxygen saturation returned to the 90s.  She was able to be titrated down to 2 L.  The patient was seen and independently evaluated by Dr. Eudelia Bunch, attending physician.  Patient's medical record was thoroughly reviewed.  No previous blood cultures from previous admission.  She was empirically treated with cefepime and vancomycin.  No fever today.  She has a leukopenia.  She is not septic.  Will defer on antibiotics at this time.  Blood cultures x2 been collected.  Her transaminases are elevated today.  She tested positive for hepatitis C during her last admission.  Transaminases were trending down when she left AMA, but are elevated again today.  However, during last admission her alkaline phosphatase was normal.   Today it is elevated at 327.  Bilirubin is normal.  CK level is also normal today.  Question of elevated alkaline phosphatase is secondary to an underlying osteomyelitis? versus Hepatitis C? or shock liver?  Urinalysis is unremarkable.  Given hypoxia, will plan to repeat PE study.  COVID-19 test is pending.  She also has sed rate and CRP pending.  Buprenorphine given for pain control.  Patient care transferred to [facility] and Dr. Rubin Payor at the end of my shift as the patient will require admission for hypoxia and further evaluation of known myositis once labs have resulted. Patient presentation, ED course, and plan of care discussed with review of all pertinent labs and imaging. Please see his/her note for further details regarding further ED course and disposition.  Final Clinical Impression(s) / ED Diagnoses Final diagnoses:  None    Rx / DC Orders ED Discharge Orders    None       Saivion Goettel A, PA-C 03/10/19 0754

## 2019-03-10 NOTE — ED Notes (Signed)
Pt placed back on 2L [place] due to oxygen saturations being 89% with good pleth. Oxygen saturations increased to 96%. Will cont to monitor.

## 2019-03-10 NOTE — Progress Notes (Signed)
Pharmacy Antibiotic Note  Julie Sanford is a 39 y.o. female admitted on 03/10/2019 with back pain.  Pharmacy has been consulted for vancomycin and cefepime dosing. Pt is afebrile and WBC is low. Scr is WNL. Lactic acid is WNL.   Plan: Cefepime 2gm IV Q8H Vancomycin 1250mg  IV Q24H F/u renal fxn, C&S, clinical status and peak/trough at SS  Height: 4\' 11"  (149.9 cm) Weight: 138 lb (62.6 kg)(per last admission) IBW/kg (Calculated) : 43.2  Temp (24hrs), Avg:97.5 F (36.4 C), Min:97.4 F (36.3 C), Max:97.5 F (36.4 C)  Recent Labs  Lab 03/10/19 0137 03/10/19 0138 03/10/19 0412  WBC  --  1.7*  --   CREATININE  --  0.78  --   LATICACIDVEN 0.7  --  0.9    Estimated Creatinine Clearance: 78.3 mL/min (by C-G formula based on SCr of 0.78 mg/dL).    Allergies  Allergen Reactions  . Quetiapine Other (See Comments)    Severe muscle spasms and hallucinations  . Tramadol Other (See Comments)    Muscle spasms, seizure and hallucinations  . Penicillins Hives    Can't remember, told as a child Has patient had a PCN reaction causing immediate rash, facial/tongue/throat swelling, SOB or lightheadedness with hypotension: no Has patient had a PCN reaction causing severe rash involving mucus membranes or skin necrosis: no Has patient had a PCN reaction that required hospitalization: no Has patient had a PCN reaction occurring within the last 10 years: no If all of the above answers are "NO", then may proceed with Cephalosporin use.     Antimicrobials this admission: Vanc 1/27>> Cefepime 1/7>>  Dose adjustments this admission: N/A  Microbiology results: Pending  Thank you for allowing pharmacy to be a part of this patient's care.  Zacchary Pompei, 03/12/19 03/10/2019 2:22 PM

## 2019-03-10 NOTE — Progress Notes (Signed)
Patient very uncooperative.  Started raising up in scanner and demanding that I take her out.  Pull O2 from face, and would not keep face mask on.  Patient continually attempted to get off of bed and stretcher.

## 2019-03-10 NOTE — ED Triage Notes (Signed)
Pt c/o lower back pain, reports that she has been treated recently for an infection in her back but had to sign out AMA. Afebrile at this time, SpO2 85% room air, improved on 2L [place].

## 2019-03-10 NOTE — ED Notes (Signed)
Pt needs Suboxone bere MRI. Suboxone ordered from Pharmacy

## 2019-03-11 DIAGNOSIS — L80 Vitiligo: Secondary | ICD-10-CM

## 2019-03-11 DIAGNOSIS — D61818 Other pancytopenia: Secondary | ICD-10-CM | POA: Diagnosis present

## 2019-03-11 DIAGNOSIS — R768 Other specified abnormal immunological findings in serum: Secondary | ICD-10-CM | POA: Diagnosis present

## 2019-03-11 LAB — COMPREHENSIVE METABOLIC PANEL
ALT: 427 U/L — ABNORMAL HIGH (ref 0–44)
AST: 779 U/L — ABNORMAL HIGH (ref 15–41)
Albumin: 3.1 g/dL — ABNORMAL LOW (ref 3.5–5.0)
Alkaline Phosphatase: 355 U/L — ABNORMAL HIGH (ref 38–126)
Anion gap: 9 (ref 5–15)
BUN: 5 mg/dL — ABNORMAL LOW (ref 6–20)
CO2: 25 mmol/L (ref 22–32)
Calcium: 8.4 mg/dL — ABNORMAL LOW (ref 8.9–10.3)
Chloride: 103 mmol/L (ref 98–111)
Creatinine, Ser: 0.59 mg/dL (ref 0.44–1.00)
GFR calc Af Amer: >60 mL/min (ref >60)
GFR calc non Af Amer: >60 mL/min (ref >60)
Glucose, Bld: 107 mg/dL — ABNORMAL HIGH (ref 70–99)
Potassium: 4 mmol/L (ref 3.5–5.1)
Sodium: 137 mmol/L (ref 135–145)
Total Bilirubin: 0.8 mg/dL (ref 0.3–1.2)
Total Protein: 6.5 g/dL (ref 6.5–8.1)

## 2019-03-11 LAB — CBC
HCT: 36.7 % (ref 36.0–46.0)
Hemoglobin: 11.4 g/dL — ABNORMAL LOW (ref 12.0–15.0)
MCH: 23 pg — ABNORMAL LOW (ref 26.0–34.0)
MCHC: 31.1 g/dL (ref 30.0–36.0)
MCV: 74 fL — ABNORMAL LOW (ref 80.0–100.0)
Platelets: 131 10*3/uL — ABNORMAL LOW (ref 150–400)
RBC: 4.96 MIL/uL (ref 3.87–5.11)
RDW: 19.1 % — ABNORMAL HIGH (ref 11.5–15.5)
WBC: 1.9 10*3/uL — ABNORMAL LOW (ref 4.0–10.5)
nRBC: 0 % (ref 0.0–0.2)

## 2019-03-11 LAB — PATHOLOGIST SMEAR REVIEW

## 2019-03-11 LAB — RETICULOCYTES
Immature Retic Fract: 9.3 % (ref 2.3–15.9)
RBC.: 4.99 MIL/uL (ref 3.87–5.11)
Retic Count, Absolute: 22.5 10*3/uL (ref 19.0–186.0)
Retic Ct Pct: 0.5 % (ref 0.4–3.1)

## 2019-03-11 LAB — CK: Total CK: 121 U/L (ref 38–234)

## 2019-03-11 MED ORDER — BUPRENORPHINE HCL-NALOXONE HCL 8-2 MG SL SUBL
1.0000 | SUBLINGUAL_TABLET | Freq: Two times a day (BID) | SUBLINGUAL | Status: DC
  Administered 2019-03-11 (×2): 1 via SUBLINGUAL
  Filled 2019-03-11 (×2): qty 1

## 2019-03-11 NOTE — Plan of Care (Signed)
  Problem: Clinical Measurements: Goal: Respiratory complications will improve Outcome: Progressing   

## 2019-03-11 NOTE — Progress Notes (Signed)
Nutrition Brief Note  Patient identified on the Malnutrition Screening Tool (MST) Report  Wt Readings from Last 15 Encounters:  03/10/19 62.6 kg  01/24/19 62.9 kg  01/21/18 54.4 kg  07/10/17 45.4 kg  09/26/16 42.7 kg  09/18/16 43.1 kg  09/05/16 44.1 kg  08/24/16 40.9 kg  04/17/13 46.3 kg  04/17/13 46.3 kg   [name] is a 39 y/o female with a PMHx of acute hepatitis, hepatitis C, rhabdomyolysis, using IV drugs, IDA, pancytopenia, thoracic and lumbar myositis, who presents to the ED today for back pain.  Pt admitted with myositis.   Pt resting quietly in bed. Nutrition-Focused physical exam completed. Findings are no fat depletion, no muscle depletion, and no edema.  No weight loss noted. Pt refusing Ensure supplements. RD to d/c.    Labs reviewed.   Body mass index is 27.87 kg/m.   Current diet order is regular, patient is consuming approximately n/a% of meals at this time. Labs and medications reviewed.   No nutrition interventions warranted at this time. If nutrition issues arise, please consult RD.   Juandavid Dallman A. Mayford Knife, RD, LDN, CDCES Registered Dietitian II Certified Diabetes Care and Education Specialist Pager: [phone] After hours Pager: [phone]

## 2019-03-11 NOTE — Evaluation (Signed)
Physical Therapy Evaluation Patient Details Name: Julie Sanford MRN: [id number] DOB: 1980/02/16 Today's Date: 03/11/2019   History of Present Illness  Pt is a 39 y/o female admitted secondary to second flare of acute myositis of the paraspinal lumbar muscles bilaterally.  Unclear etiology, could be infectious myositis versus autoimmune disease versus trauma associated with substance use disorder. PMH including but not limited to IVDU. PMH including but not limited to Bipolar, depression, hep C and seizures.    Clinical Impression  Pt presented supine in bed with HOB elevated, awake and willing to participate in therapy session. Prior to admission, pt reported that she was independent with all functional mobility and ADLs. Pt lives with her mother and several children (ranging from 11 yrs to 2 yrs). She will have the needed support upon d/c. At the time of evaluation, pt independent with all functional mobility. She reported 10/10 pain; however, this did not inhibit her ability to move. Pt requesting pain meds during session and RN was notified. No further acute PT needs identified at this time. PT signing off.     Follow Up Recommendations No PT follow up    Equipment Recommendations  None recommended by PT    Recommendations for Other Services       Precautions / Restrictions Precautions Precautions: None Restrictions Weight Bearing Restrictions: No      Mobility  Bed Mobility Overal bed mobility: Independent                Transfers Overall transfer level: Independent                  Ambulation/Gait Ambulation/Gait assistance: Independent;Modified independent (Device/Increase time) Gait Distance (Feet): 50 Feet Assistive device: None;IV Pole Gait Pattern/deviations: WFL(Within Functional Limits) Gait velocity: decreased secondary to pain      Stairs            Wheelchair Mobility    Modified Rankin (Stroke Patients Only)       Balance  Overall balance assessment: No apparent balance deficits (not formally assessed)                                           Pertinent Vitals/Pain Pain Assessment: 0-10 Pain Score: 10-Worst pain ever Pain Location: "all over" Pain Descriptors / Indicators: Sore Pain Intervention(s): Monitored during session;Repositioned;Patient requesting pain meds-RN notified    Home Living Family/patient expects to be discharged to:: Private residence Living Arrangements: Parent Available Help at Discharge: Family;Available 24 hours/day Type of Home: House Home Access: Ramped entrance     Home Layout: One level Home Equipment: None      Prior Function Level of Independence: Independent               Hand Dominance        Extremity/Trunk Assessment   Upper Extremity Assessment Upper Extremity Assessment: Overall WFL for tasks assessed    Lower Extremity Assessment Lower Extremity Assessment: Overall WFL for tasks assessed       Communication   Communication: No difficulties  Cognition Arousal/Alertness: Awake/alert Behavior During Therapy: WFL for tasks assessed/performed;Flat affect Overall Cognitive Status: Within Functional Limits for tasks assessed                                        General  Comments      Exercises     Assessment/Plan    PT Assessment Patent does not need any further PT services  PT Problem List         PT Treatment Interventions      PT Goals (Current goals can be found in the Care Plan section)  Acute Rehab PT Goals Patient Stated Goal: decrease pain PT Goal Formulation: All assessment and education complete, DC therapy    Frequency     Barriers to discharge        Co-evaluation               AM-PAC PT "6 Clicks" Mobility  Outcome Measure Help needed turning from your back to your side while in a flat bed without using bedrails?: None Help needed moving from lying on your back to  sitting on the side of a flat bed without using bedrails?: None Help needed moving to and from a bed to a chair (including a wheelchair)?: None Help needed standing up from a chair using your arms (e.g., wheelchair or bedside chair)?: None Help needed to walk in hospital room?: None Help needed climbing 3-5 steps with a railing? : None 6 Click Score: 24    End of Session   Activity Tolerance: Patient limited by pain Patient left: with call bell/phone within reach;in bed Nurse Communication: Mobility status;Patient requests pain meds PT Visit Diagnosis: Pain Pain - part of body: ("all over")    Time: 1435-1449 PT Time Calculation (min) (ACUTE ONLY): 14 min   Charges:   PT Evaluation $PT Eval Low Complexity: 1 Low          Eduard Clos, PT, DPT  Acute Rehabilitation Services Pager [phone] Office [place] 03/11/2019, 4:03 PM

## 2019-03-11 NOTE — Progress Notes (Signed)
   S: Patient feels like she is in mild withdrawal right now.  Having pain all over, especially in her lower back.  Experiencing food aversion, nausea.  No vomiting, no diarrhea.  Denies any fevers or chills.  Not able to walk very much this morning because of the back pain.  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  Unclear etiology, could be infectious myositis versus autoimmune disease versus trauma associated with substance use disorder.  CK levels are okay in the 200s, per going to trend these today.  Supportive care for now with IV fluid and IV Dilaudid for pain control.  We are managing the opioid use disorder with buprenorphine 8 mg twice daily.  We talked about options for continuing with outpatient treatment which she is going to think about.  Outstanding labs to follow-up RV HCV genotype and viral load, myositis auto antibodies, and ENA panel.  Acute hepatitis is improving with supportive care, this probably reflects chronic liver disease that was exacerbated by this acute illness and inflammation.  Anticipate discharge to home once her pain is well controlled, opioid withdrawal is managed with buprenorphine, when she is more tolerant of food, and when she is  ambulating.  Probably 1-2 more days in house.   Tyson Alias, MD 03/11/2019, 1:01 PM

## 2019-03-12 DIAGNOSIS — F1193 Opioid use, unspecified with withdrawal: Secondary | ICD-10-CM

## 2019-03-12 LAB — HCV RNA QUANT: HCV Quantitative: NOT DETECTED IU/mL (ref >50)

## 2019-03-12 MED ORDER — BUPRENORPHINE HCL-NALOXONE HCL 8-2 MG SL SUBL: 1.0000 | SUBLINGUAL_TABLET | Freq: Three times a day (TID) | SUBLINGUAL | 0 refills | Status: AC

## 2019-03-12 MED ORDER — BUPRENORPHINE HCL-NALOXONE HCL 8-2 MG SL SUBL
1.0000 | SUBLINGUAL_TABLET | Freq: Three times a day (TID) | SUBLINGUAL | Status: DC
  Administered 2019-03-12: 10:00:00 1 via SUBLINGUAL
  Filled 2019-03-12: qty 1

## 2019-03-12 MED ORDER — LEVOTHYROXINE SODIUM 50 MCG PO TABS
50.0000 ug | ORAL_TABLET | Freq: Every day | ORAL | Status: DC
  Administered 2019-03-12: 50 ug via ORAL
  Filled 2019-03-12: qty 1

## 2019-03-12 MED ORDER — LEVOTHYROXINE SODIUM 50 MCG PO TABS: 50.0000 ug | ORAL_TABLET | Freq: Every day | ORAL | 1 refills | Status: AC

## 2019-03-12 MED FILL — LEVOTHYROXINE 50 MCG TABLET: 50 | 30 days supply | Qty: 30 | Fill #0

## 2019-03-12 MED FILL — SUBOXONE 8 MG-2 MG SL FILM: 8-2 | 30 days supply | Qty: 30 | Fill #0

## 2019-03-12 NOTE — Social Work (Signed)
CSW acknowledging consult for PCP needs.  Pt has Medicaid listed for her insurance; under Medicaid pts have assigned providers. This can be found on her card, if she does not have her card it can be located by calling local DSS department.  This has been added to her dc instructions; MD contacted and updated with this information as well.   CSW signing off. Please consult if any additional needs arise.  Doy Hutching, LCSWA [place] Clinical Social Work

## 2019-03-12 NOTE — Evaluation (Signed)
Occupational Therapy Evaluation Patient Details Name: Julie Sanford MRN: [id number] DOB: [birth year] Today's Date: 03/12/2019    History of Present Illness Pt is a 39 y/o female admitted secondary to second flare of acute myositis of the paraspinal lumbar muscles bilaterally.  Unclear etiology, could be infectious myositis versus autoimmune disease versus trauma associated with substance use disorder. PMH including but not limited to IVDU. PMH including but not limited to Bipolar, depression, hep C and seizures.   Clinical Impression   Patient evaluated by Occupational Therapy with no further acute OT needs identified. All education has been completed and the patient has no further questions. See below for any follow-up Occupational Therapy or equipment needs. OT to sign off. Thank you for referral.  .    Follow Up Recommendations  No OT follow up    Equipment Recommendations  None recommended by OT    Recommendations for Other Services       Precautions / Restrictions        Mobility Bed Mobility Overal bed mobility: Independent                Transfers Overall transfer level: Independent                    Balance                                           ADL either performed or assessed with clinical judgement   ADL Overall ADL's : Independent                                             Vision         Perception     Praxis      Pertinent Vitals/Pain Pain Assessment: No/denies pain     Hand Dominance Right   Extremity/Trunk Assessment Upper Extremity Assessment Upper Extremity Assessment: Overall WFL for tasks assessed   Lower Extremity Assessment Lower Extremity Assessment: Overall WFL for tasks assessed   Cervical / Trunk Assessment Cervical / Trunk Assessment: Normal   Communication Communication Communication: No difficulties   Cognition Arousal/Alertness: Awake/alert Behavior During  Therapy: WFL for tasks assessed/performed;Flat affect Overall Cognitive Status: Within Functional Limits for tasks assessed                                     General Comments  feels nausea     Exercises     Shoulder Instructions      Home Living Family/patient expects to be discharged to:: Private residence Living Arrangements: Parent Available Help at Discharge: Family;Available 24 hours/day Type of Home: House Home Access: Ramped entrance     Home Layout: One level     Bathroom Shower/Tub: Teacher, early years/pre: Standard                Prior Functioning/Environment Level of Independence: Independent                 OT Problem List:        OT Treatment/Interventions:      OT Goals(Current goals can be found in the care plan section)    OT Frequency:  Barriers to D/C:            Co-evaluation              AM-PAC OT "6 Clicks" Daily Activity     Outcome Measure Help from another person eating meals?: None Help from another person taking care of personal grooming?: None Help from another person toileting, which includes using toliet, bedpan, or urinal?: None Help from another person bathing (including washing, rinsing, drying)?: None Help from another person to put on and taking off regular upper body clothing?: None Help from another person to put on and taking off regular lower body clothing?: None 6 Click Score: 24   End of Session Nurse Communication: Mobility status;Precautions  Activity Tolerance: Patient tolerated treatment well Patient left: in bed;with call bell/phone within reach  OT Visit Diagnosis: Unsteadiness on feet (R26.81)                Time: 8350-7573 OT Time Calculation (min): 13 min Charges:  OT General Charges $OT Visit: 1 Visit OT Evaluation $OT Eval Low Complexity: 1 Low   Brynn, OTR/L  Acute Rehabilitation Services Pager: [phone] Office: [phone] .   Mateo Flow 03/12/2019, 4:52 PM

## 2019-03-12 NOTE — Progress Notes (Signed)
Patient discharged to home with instructions and her medications.

## 2019-03-12 NOTE — Progress Notes (Signed)
   Subjective:   Ms. Dundon states she is feeling better today. She endorses some continued withdrawal symptoms including sweating, shaking. She is interested in going home today.   Objective:  Vital signs in last 24 hours: Vitals:   03/11/19 0552 03/11/19 1303 03/11/19 2047 03/12/19 0408  BP: 131/88 (!) 125/91 125/88 (!) 136/96  Pulse: 68 75 68 71  Resp: 16 20 17 17   Temp: 98.7 F (37.1 C) 98.5 F (36.9 C) 98.3 F (36.8 C) 98.2 F (36.8 C)  TempSrc: Oral Oral Oral Oral  SpO2: 94% 98% 99% 98%  Weight:      Height:        Physical Exam Vitals and nursing note reviewed.  Constitutional:      General: She is not in acute distress.    Appearance: She is normal weight.  Pulmonary:     Effort: Pulmonary effort is normal. No respiratory distress.  Skin:    General: Skin is warm and dry.  Neurological:     General: No focal deficit present.     Mental Status: She is alert and oriented to person, place, and time. Mental status is at baseline.  Psychiatric:        Mood and Affect: Mood normal.        Behavior: Behavior normal.    Assessment/Plan:  Principal Problem:   Myositis Active Problems:   Acute hepatitis   Opioid use disorder (HCC)   Pancytopenia (HCC)   HCV antibody positive   # Lumbar Myositis:  Pain improved today. Patient requests to defer additional lab draws today so will recommend outpatient work up for autoimmune myositis.   # Acute Hepatitis  # Hepatitis C Hep C RNA quant still pending. Will contact patient with results.   # OUD:  Patient endorses continued withdrawal today on Suboxone 8 mg BID. Will increase to TID and discharge with 10 day prescription to bridge to clinic appointment.  # Pancytopenia Stable and will require outpatient follow up   Dispo: Anticipated discharge today.   Dr. Internal Medicine PGY-1  Pager: [phone] 03/12/2019, 6:50 AM

## 2019-03-12 NOTE — Discharge Summary (Signed)
Name: Julie Sanford MRN: [id number] DOB: 1980/02/21 39 y.o. PCP: Patient, No Pcp Per  Date of Admission: 03/10/2019  1:23 AM Date of Discharge: 03/12/2019 Attending Physician: Axel Filler, *  Discharge Diagnosis: 1.  Lumbar myositis 2.  Positive hep C antibody 3.  Opioid use disorder 4.  Pancytopenia 5. Acute Hepatitis  6.  Hypoxia  Discharge Medications: Allergies as of 03/12/2019      Reactions   Quetiapine Other (See Comments)   Severe muscle spasms and hallucinations   Tramadol Other (See Comments)   Muscle spasms, seizure and hallucinations   Penicillins Hives   Can't remember, told as a child Has patient had a PCN reaction causing immediate rash, facial/tongue/throat swelling, SOB or lightheadedness with hypotension: no Has patient had a PCN reaction causing severe rash involving mucus membranes or skin necrosis: no Has patient had a PCN reaction that required hospitalization: no Has patient had a PCN reaction occurring within the last 10 years: no If all of the above answers are "NO", then may proceed with Cephalosporin use.      Medication List    TAKE these medications   albuterol 108 (90 Base) MCG/ACT inhaler Commonly known as: VENTOLIN HFA Inhale 2 puffs into the lungs every 4 (four) hours as needed for wheezing or shortness of breath.   buprenorphine-naloxone 8-2 mg Subl SL tablet Commonly known as: SUBOXONE Place 1 tablet under the tongue 3 (three) times daily.   gabapentin 300 MG capsule Commonly known as: NEURONTIN Take 1 capsule (300 mg total) by mouth 3 (three) times daily. Agitation   hydrOXYzine 50 MG tablet Commonly known as: ATARAX/VISTARIL Take 2 tablets (100 mg total) by mouth 3 (three) times daily as needed for itching or anxiety.   levETIRAcetam 500 MG tablet Commonly known as: KEPPRA Take 1 tablet (500 mg total) by mouth 2 (two) times daily. For seizures   levothyroxine 50 MCG tablet Commonly known as: SYNTHROID Take 1  tablet (50 mcg total) by mouth daily at 6 (six) AM.   mirtazapine 45 MG disintegrating tablet Commonly known as: REMERON SOL-TAB Take 1 tablet (45 mg total) by mouth at bedtime. For depression       Disposition and follow-up:   Ms.Julie Sanford was discharged from [facility] in Good condition.  At the hospital follow up visit please address:  1.   Recommend outpatient follow up of chronic and progressive pancytopenia  If continued myositis, may benefit from autoimmune work up Started on Suboxone on 1/27 Nodularities noted on CT imaging, recommended follow-up CT imaging in 6 to 12 months  2.  Labs / imaging needed at time of follow-up: CBC, CMP  3.  Pending labs/ test needing follow-up: None   Hospital Course by problem list: 1.  Lumbar myositis Julie Sanford was admitted to the hospital after presenting to the ED with acute on chronic lower lumbar pain.  Given recent history of lumbar and thoracic myositis, there was concern for an acute flare versus abscess secondary to recent IV heroin use.  She was started on broad-spectrum antibiotics at this time.  MRI was negative for abscess but did show continued lumbar myositis.  Given that this is patient's second episode of myositis in the last couple months, concern for an autoimmune source versus infectious source.  Antibiotics were discontinued.  Blood cultures were drawn and no growth to date.  Supportive care with IV fluids was provided and pain control.  2.  Positive hep C antibody  Patient has had a positive hep B antibody since 2018.  RNA quant was obtained during hospitalization and negative.  Patient was informed of this via telephone as she had already been discharged.  3.  Opioid use disorder Patient endorses a history of opioid use disorder for several years with mainly heroin use.  She has been on Suboxone in the past and reports that it worked well for her.  She is interested on restarting Suboxone at this time,  so she was initiated in the ED.  Discharged with 10 days of 8 mg twice daily to bridge to outpatient follow-up.  4.  Pancytopenia Anemia present the longest, since at least 2013. Leukopenia has been present since at least 2018. Thrombocytopenia is new since December 2020. Anemia was worked up and iron deficiency was playing a role with saturation of 3%. Ferritin was 21 in December but 5 in 2019. Previous ret count showed evidence of hyperproliferation. Uncertain etiology at this point, may be secondary to nutritional deficits.  Recommend outpatient follow-up and possible referral to hematology.  5. Acute Hepatitis  Recent history of elevated LFTs back in December of 2020, at which time patient was evaluated by GI. Differential at that time included acute presentation of Hepatitis C, which patient tested positive for antibodies VS shock liver. Patient was positive for antibodies since 2018 but full work up not completed. No encephalopathy at this time or evidence of acute liver failure.  LFTs continue to trend down during hospitalization.  Outpatient follow-up recommended.  6.  Hypoxia Patient had hypoxia to the low 80s on room air while in the ED which improved with 6 L nasal cannula that was then weaned down to 2 L nasal cannula.  On our examination, she was already back on room air and saturating well.  No signs or symptoms to suspect acute URI.  CTA was obtained in the ED and negative.  It did show clustered nodularities in the right upper lobe that will need outpatient follow-up.  Patient was already receiving broad-spectrum antibiotics for back pain that was providing dual coverage.   Discharge Vitals:   BP (!) 136/96 (BP Location: Left Arm)   Pulse 71   Temp 98.2 F (36.8 C) (Oral)   Resp 17   Ht '4\' 11"'  (1.499 m)   Wt 62.6 kg Comment: per last admission  SpO2 98%   BMI 27.87 kg/m   Pertinent Labs, Studies, and Procedures:   CBC Latest Ref Rng & Units 03/11/2019 03/10/2019 01/24/2019    WBC 4.0 - 10.5 K/uL 1.9(L) 1.7(L) 3.4(L)  Hemoglobin 12.0 - 15.0 g/dL 11.4(L) 10.8(L) 8.2(L)  Hematocrit 36.0 - 46.0 % 36.7 36.7 26.7(L)  Platelets 150 - 400 K/uL 131(L) 129(L) 243   CMP Latest Ref Rng & Units 03/11/2019 03/10/2019 03/10/2019  Glucose 70 - 99 mg/dL 107(H) 103(H) 111(H)  BUN 6 - 20 mg/dL 5(L) 5(L) 8  Creatinine 0.44 - 1.00 mg/dL 0.59 0.64 0.78  Sodium 135 - 145 mmol/L 137 137 135  Potassium 3.5 - 5.1 mmol/L 4.0 3.9 4.0  Chloride 98 - 111 mmol/L 103 101 98  CO2 22 - 32 mmol/L '25 25 28  ' Calcium 8.9 - 10.3 mg/dL 8.4(L) 8.3(L) 8.5(L)  Total Protein 6.5 - 8.1 g/dL 6.5 6.6 7.1  Total Bilirubin 0.3 - 1.2 mg/dL 0.8 0.8 0.6  Alkaline Phos 38 - 126 U/L 355(H) 360(H) 327(H)  AST 15 - 41 U/L 779(H) 958(H) 1,041(H)  ALT 0 - 44 U/L 427(H) 484(H) 451(H)   CXR:  03/10/2019 IMPRESSION: Normal study  CTA chest: 03/10/2019 IMPRESSION: 1. No CT evidence for acute pulmonary embolus. 2. Clustered ill-defined nodularity in the posterior right upper lobe likely reflects atypical infection (including MAI). 3. 1.0 x 0.6 cm nodule in the right apex is stable since 07/11/2017 although it is new since 11/03/2016. Nearly 2 years of stable imaging follow-up is reassuring for a benign etiology. Consider 1 additional follow-up CT in 6-12 months to document 2 years of imaging stability.  MRI Lumbar: 03/10/2019 IMPRESSION: Prematurely terminated, motion degraded and limited examination as described.  No definite marrow edema is identified on the acquired sequences.  Edema signal within the lower lumbar paraspinal musculature consistent with the provided history of myositis.  No significant focal disc herniation, spinal canal stenosis or neural foraminal narrowing is identified.  There is prominent T2 hyperintensity within the right aspect of the abdomen which is poorly assessed on the current study and indeterminate in etiology. Consider dedicated ultrasound or CT imaging for further  evaluation, as clinically warranted.  Discharge Instructions: Discharge Instructions    Call MD for:  difficulty breathing, headache or visual disturbances   Complete by: As directed    Call MD for:  extreme fatigue   Complete by: As directed    Call MD for:  persistant dizziness or light-headedness   Complete by: As directed    Call MD for:  persistant nausea and vomiting   Complete by: As directed    Call MD for:  redness, tenderness, or signs of infection (pain, swelling, redness, odor or green/yellow discharge around incision site)   Complete by: As directed    Call MD for:  severe uncontrolled pain   Complete by: As directed    Call MD for:  temperature >100.4   Complete by: As directed    Diet - low sodium heart healthy   Complete by: As directed    Discharge instructions   Complete by: As directed    Ms. Cozine,   Please follow up with a primary care provider in the next 1-2 weeks.   We will call you with the Hepatitis C results.   It was a pleasure to meet you!   Increase activity slowly   Complete by: As directed       Signed: Dr. Jose Persia Internal Medicine PGY-1  Pager: [phone] 03/12/2019, 8:25 AM

## 2019-03-15 LAB — CULTURE, BLOOD (ROUTINE X 2)
Culture: NO GROWTH
Culture: NO GROWTH
Special Requests: ADEQUATE
Special Requests: ADEQUATE

## 2019-03-27 IMAGING — CR DG KNEE COMPLETE 4+V*R*
4 series · 4 of 4 positions shown · non-contrast
Comparison: Radiograph dated 08/16/2016

CLINICAL DATA: 35-year-old female with fall and right knee pain.

EXAM:
RIGHT KNEE - COMPLETE 4+ VIEW

[x knee ap right (1 of 3)]
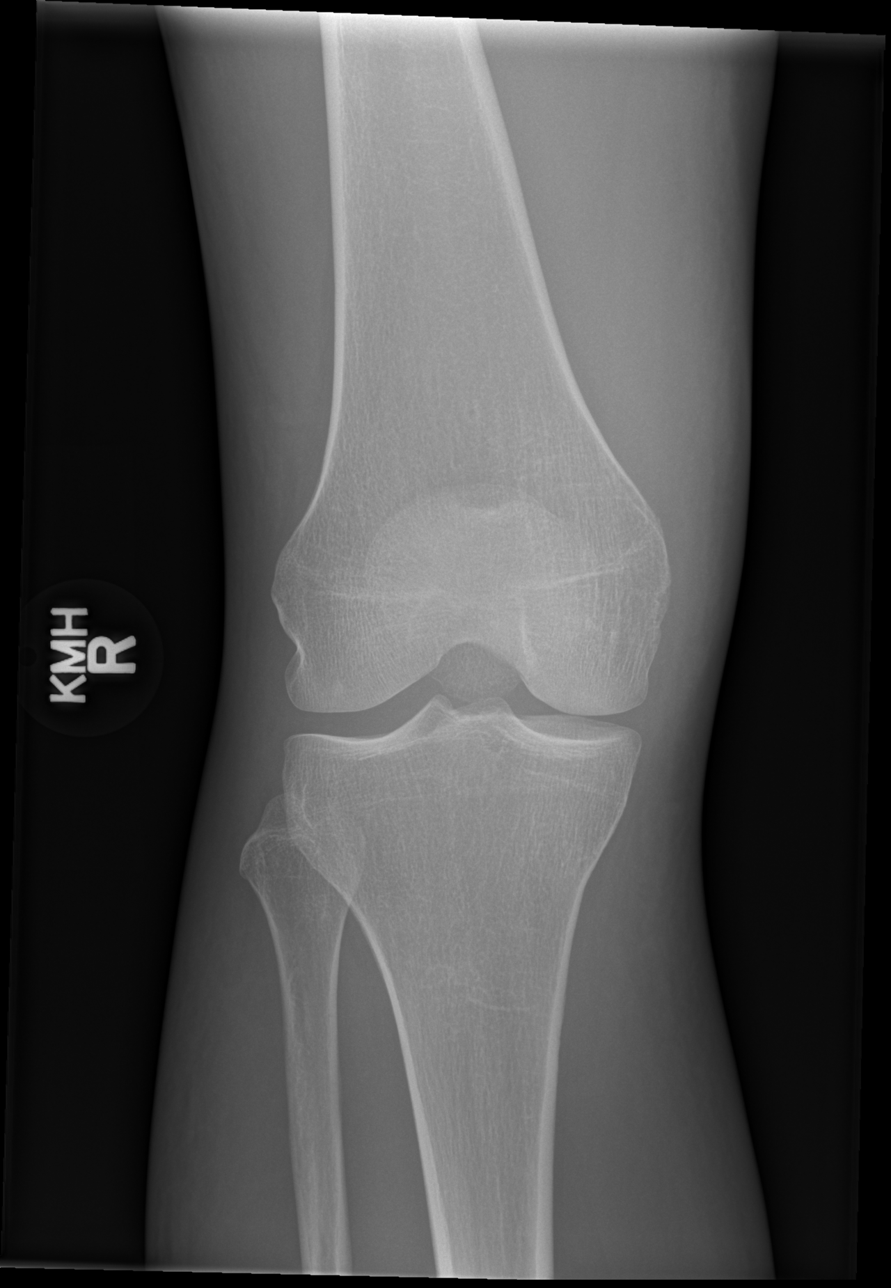

[x knee ap right (2 of 3)]
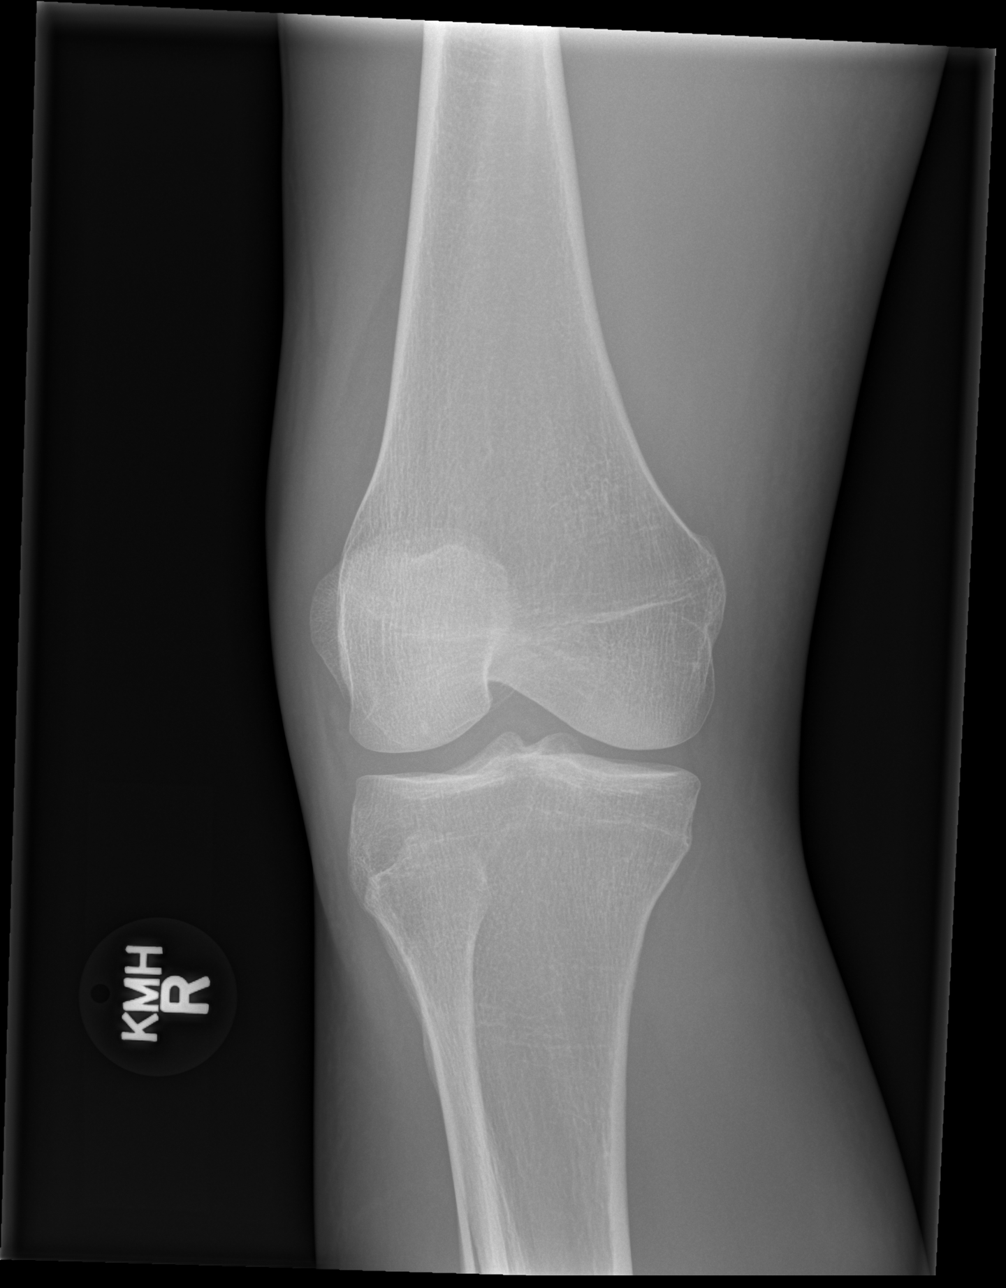

[x knee ap right (3 of 3)]
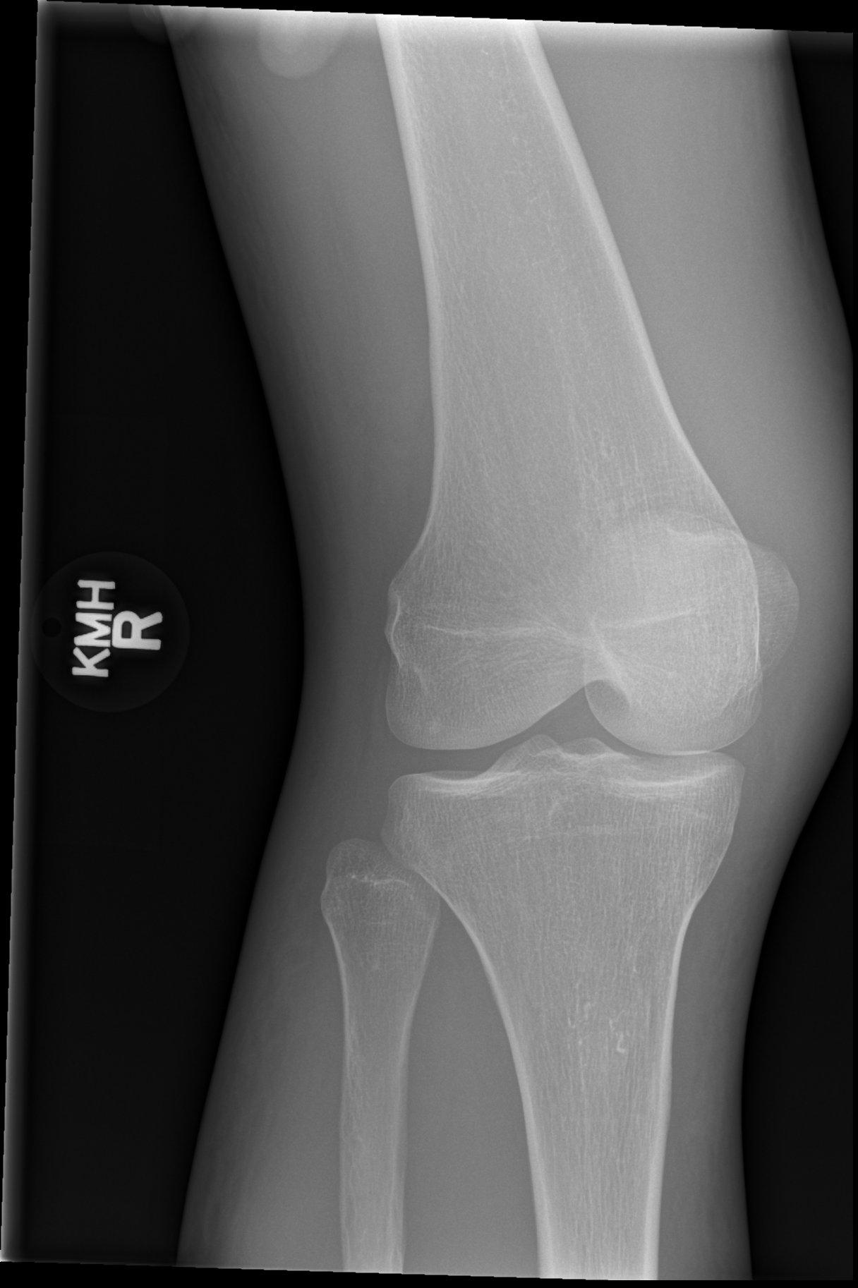

[x knee lat right]
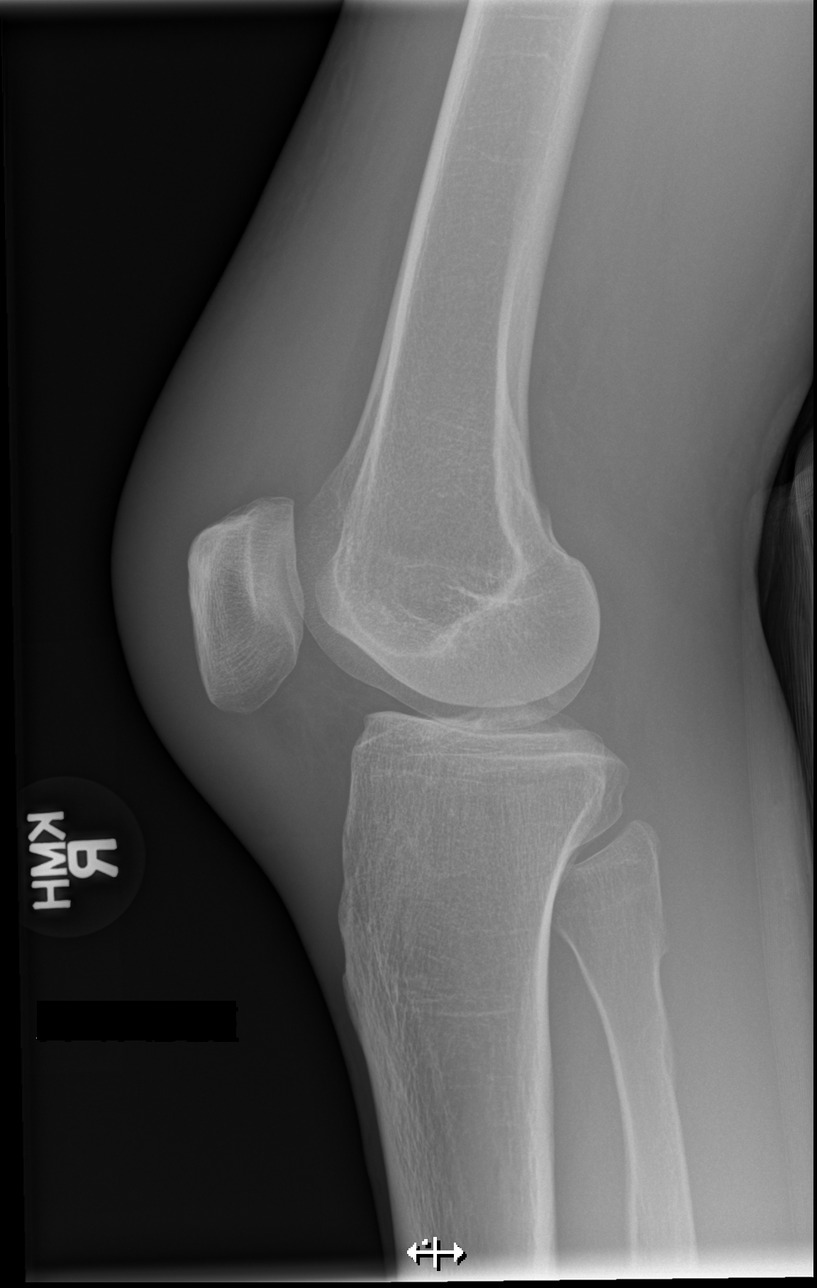

[4 of 4 positions shown; findings below may reference images not displayed]

FINDINGS: There is no acute fracture or dislocation. The bones are well
mineralized. No arthritic changes. Small suprapatellar effusion.
There is diffuse soft tissue swelling over the anterior knee, new
compared to the prior study. Correlation with clinical exam is
recommended to evaluate for an infectious process.
IMPRESSION: 1. No acute osseous pathology.
2. Diffuse soft tissue swelling of the anterior knee with probable
small suprapatellar effusion. Clinical correlation is recommended to
evaluate for an infectious process.
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
